# Patient Record
Sex: Male | Born: 1940 | Hispanic: No | Marital: Married | Smoking: Never smoker
Health system: Southern US, Community
[De-identification: ages and names within clinical notes are randomized; demographics above are authoritative.]

## PROBLEM LIST (undated history)

## (undated) DIAGNOSIS — I714 Abdominal aortic aneurysm, without rupture, unspecified: Secondary | ICD-10-CM

## (undated) DIAGNOSIS — I10 Essential (primary) hypertension: Secondary | ICD-10-CM

## (undated) DIAGNOSIS — N400 Enlarged prostate without lower urinary tract symptoms: Secondary | ICD-10-CM

## (undated) DIAGNOSIS — E785 Hyperlipidemia, unspecified: Secondary | ICD-10-CM

## (undated) DIAGNOSIS — R351 Nocturia: Secondary | ICD-10-CM

## (undated) HISTORY — PX: CARPAL TUNNEL RELEASE: SHX101

## (undated) HISTORY — PX: EYE SURGERY: SHX253

## (undated) HISTORY — DX: Abdominal aortic aneurysm, without rupture, unspecified: I71.40

---

## 2009-11-06 ENCOUNTER — Encounter: Admission: RE | Admit: 2009-11-06 | Discharge: 2009-11-06 | Payer: Self-pay | Admitting: *Deleted

## 2009-11-06 IMAGING — US US CAROTID DUPLEX BILAT
1 series · 13 of 24 positions shown · non-contrast
Comparison: None.

CLINICAL DATA: Transit ischemic attacks

BILATERAL CAROTID DUPLEX ULTRASOUND
TECHNIQUE: Gray scale imaging, color Doppler and duplex ultrasound
was performed of bilateral carotid and vertebral arteries in the
neck.

[Series 1: us carotid duplex bilat · 0.06mm/px · 13 of 61 slices shown]
[im 1/61]
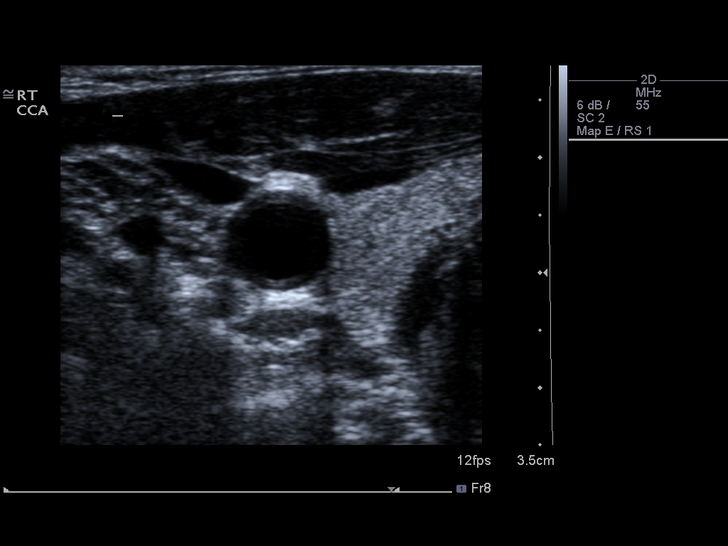
[im 6/61]
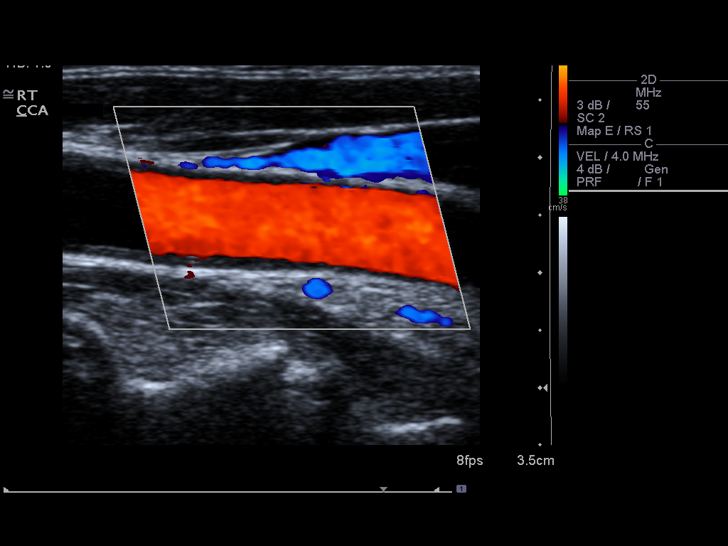
[im 11/61]
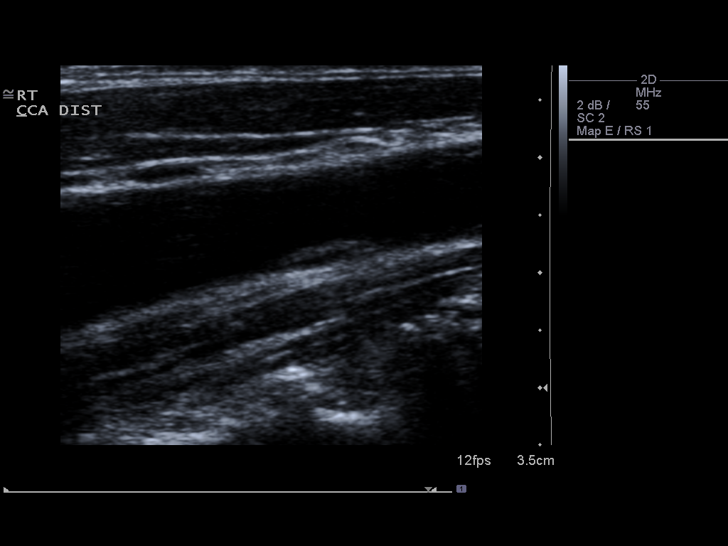
[im 16/61]
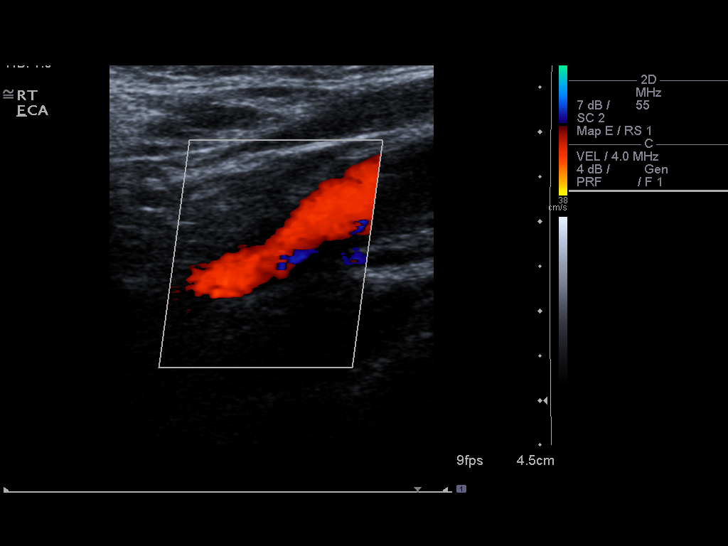
[im 21/61]
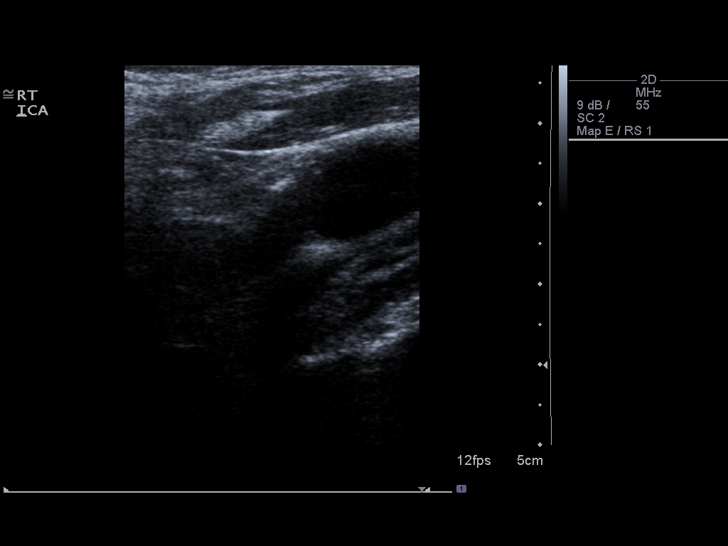
[im 27/61]
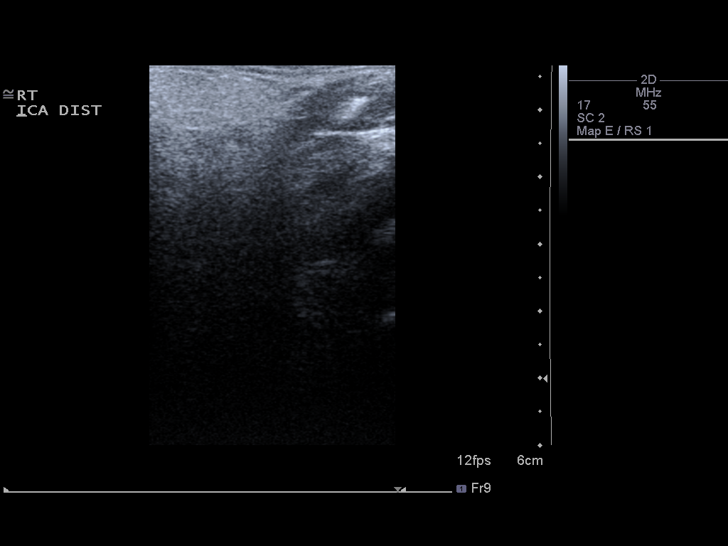
[im 32/61]
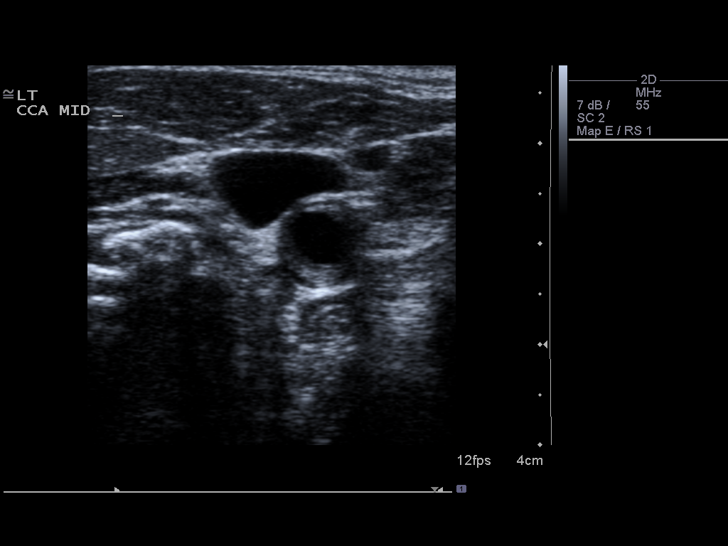
[im 34/61]
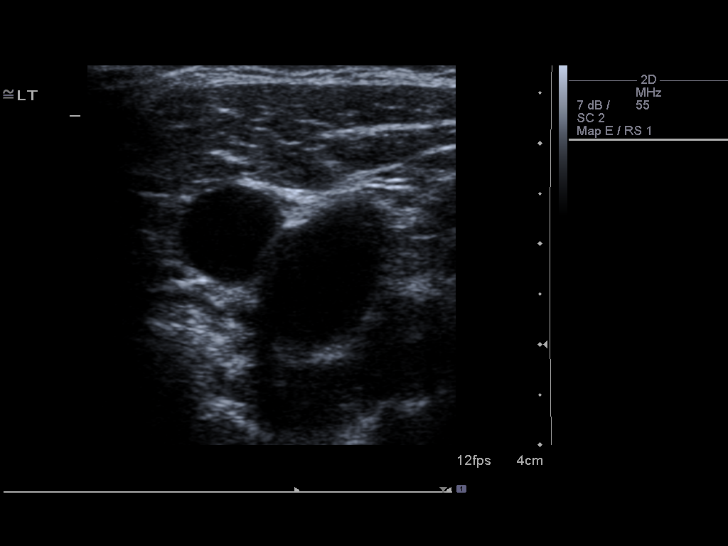
[im 40/61]
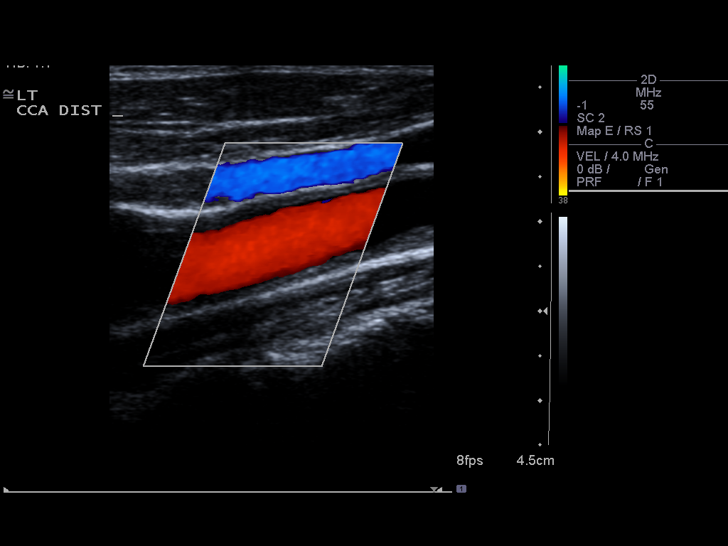
[im 45/61]
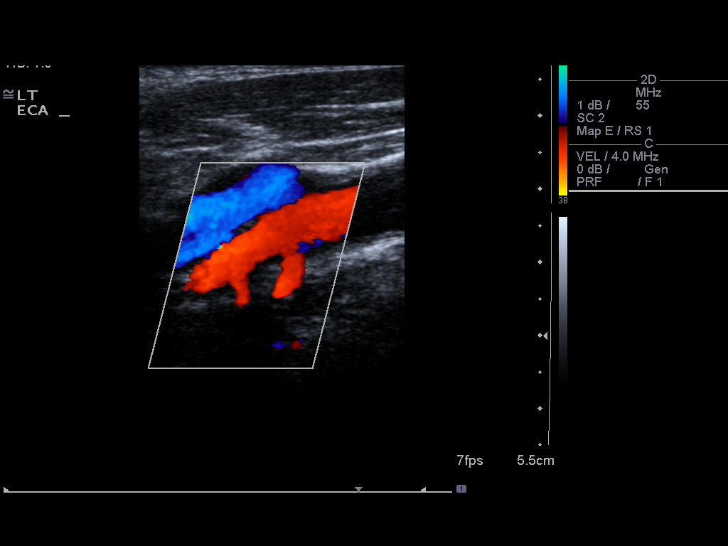
[im 50/61]
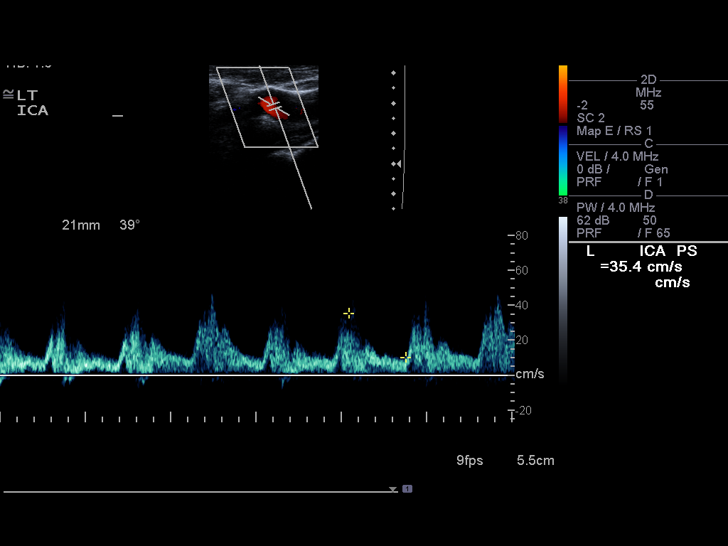
[im 55/61]
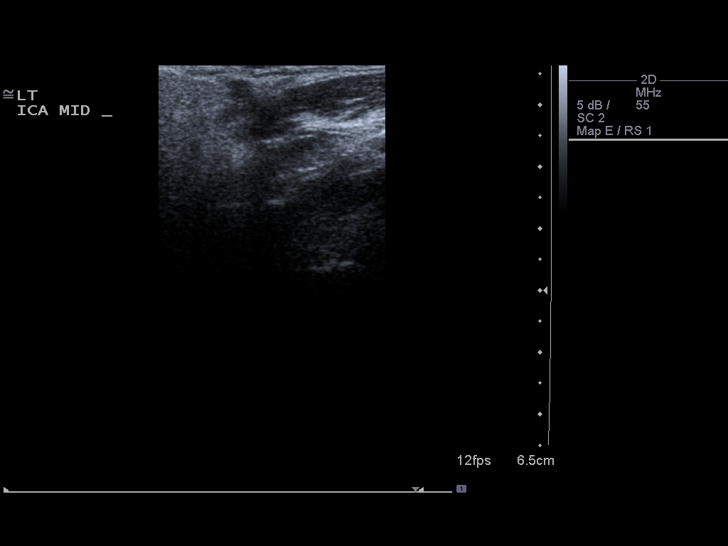
[im 61/61]
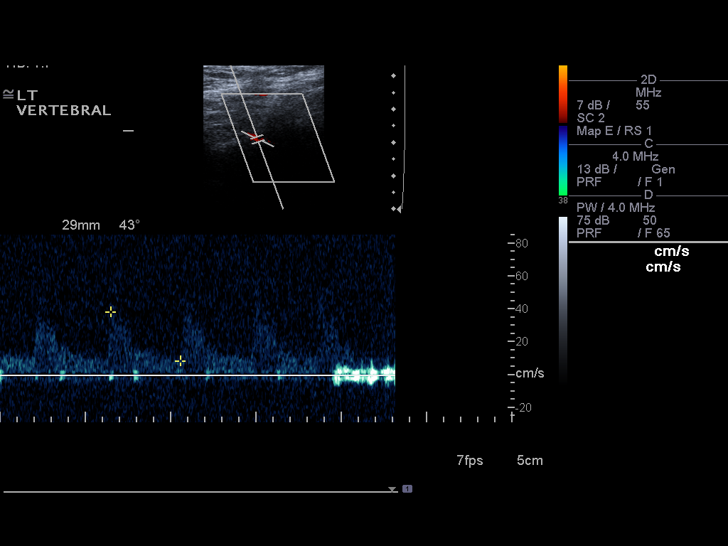

[13 of 24 positions shown; findings below may reference images not displayed]

Criteria:  Quantification of carotid stenosis is based on velocity
parameters that correlate the residual internal carotid diameter
with NASCET-based stenosis levels, using the diameter of the distal
internal carotid lumen as the denominator for stenosis measurement.

The following velocity measurements were obtained:

                 PEAK SYSTOLIC/END DIASTOLIC
RIGHT
ICA:                        74/21cm/sec
CCA:                        131/24cm/sec
SYSTOLIC ICA/CCA RATIO:
DIASTOLIC ICA/CCA RATIO:
ECA:                        71/11cm/sec

LEFT
ICA:                        78/28cm/sec
CCA:                        95/22cm/sec
SYSTOLIC ICA/CCA RATIO:
DIASTOLIC ICA/CCA RATIO:
ECA:                        60/9cm/sec
FINDINGS: RIGHT CAROTID ARTERY: Minimal right carotid bifurcation plaque
formation and atherosclerosis.  No hemodynamically significant ICA
stenosis, velocity elevation, or turbulent flow.

RIGHT VERTEBRAL ARTERY:  Antegrade

LEFT CAROTID ARTERY: Mild left carotid bifurcation plaque formation
and atherosclerosis, slightly more than the left.  Despite this,
there is no hemodynamically significant ICA stenosis, velocity
elevation, or turbulent flow.

LEFT VERTEBRAL ARTERY:  Antegrade
IMPRESSION: Left greater right carotid atherosclerosis.  No hemodynamically
significant ICA stenosis on either side.

## 2010-03-27 ENCOUNTER — Telehealth: Payer: Self-pay | Admitting: Family Medicine

## 2010-08-04 ENCOUNTER — Encounter: Admission: RE | Admit: 2010-08-04 | Discharge: 2010-08-04 | Payer: Self-pay | Admitting: Family Medicine

## 2010-10-07 NOTE — Progress Notes (Signed)
Summary: no show for new pt appt  Phone Note Other Incoming   Summary of Call: no show for new pt appt  Follow-up for Phone Call        charge the NS fee Follow-up by: Nelwyn Salisbury MD,  March 27, 2010 1:43 PM  Additional Follow-up for Phone Call Additional follow up Details #1::        Pt called and apologized about not coming in for ov today. Pt had a family emergency and couldnt keep appt.       Additional Follow-up by: Lucy Antigua,  March 27, 2010 4:41 PM

## 2011-12-31 ENCOUNTER — Other Ambulatory Visit: Payer: Self-pay | Admitting: Family Medicine

## 2011-12-31 DIAGNOSIS — R0989 Other specified symptoms and signs involving the circulatory and respiratory systems: Secondary | ICD-10-CM

## 2011-12-31 DIAGNOSIS — Z136 Encounter for screening for cardiovascular disorders: Secondary | ICD-10-CM

## 2012-01-05 ENCOUNTER — Ambulatory Visit
Admission: RE | Admit: 2012-01-05 | Discharge: 2012-01-05 | Disposition: A | Discharge status: Home / Self Care | Payer: Medicare Other | Source: Ambulatory Visit | Attending: Family Medicine | Admitting: Family Medicine

## 2012-01-05 DIAGNOSIS — R0989 Other specified symptoms and signs involving the circulatory and respiratory systems: Secondary | ICD-10-CM

## 2012-01-05 DIAGNOSIS — Z136 Encounter for screening for cardiovascular disorders: Secondary | ICD-10-CM

## 2012-01-05 IMAGING — US US AORTA SCREENING (MEDICARE)
1 series · 14 of 25 positions shown · non-contrast
Comparison: None

CLINICAL DATA: Screening for abdominal aortic aneurysm.

ABDOMINAL AORTA SCREENING ULTRASOUND
TECHNIQUE: Ultrasound examination of the abdominal aorta was
performed as a screening evaluation for abdominal aortic aneurysm.

[Series 1: us aorta screening (medicare) · 0.24mm/px · 14 of 30 slices shown]
[im 1/30]
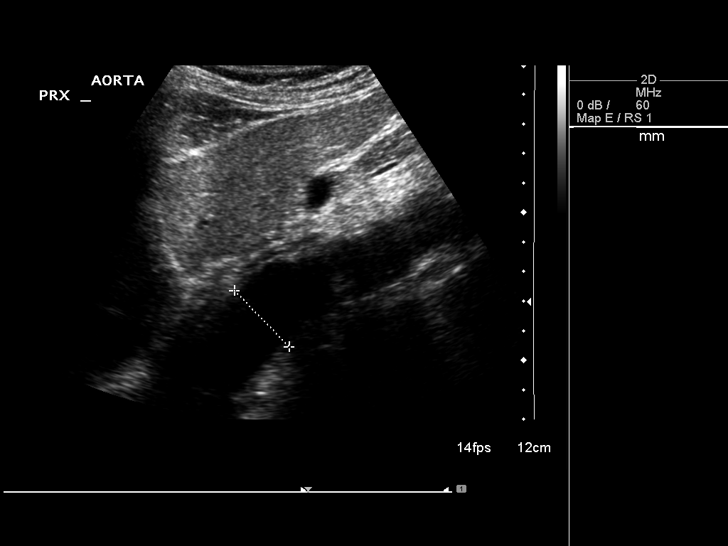
[im 3/30]
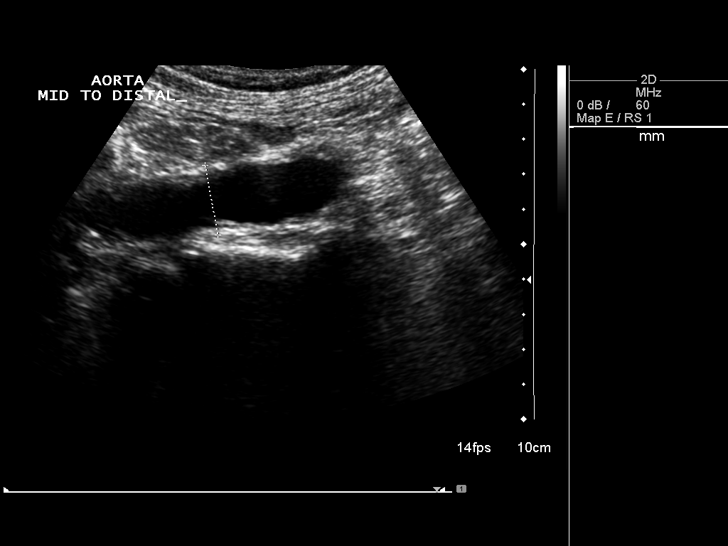
[im 5/30]
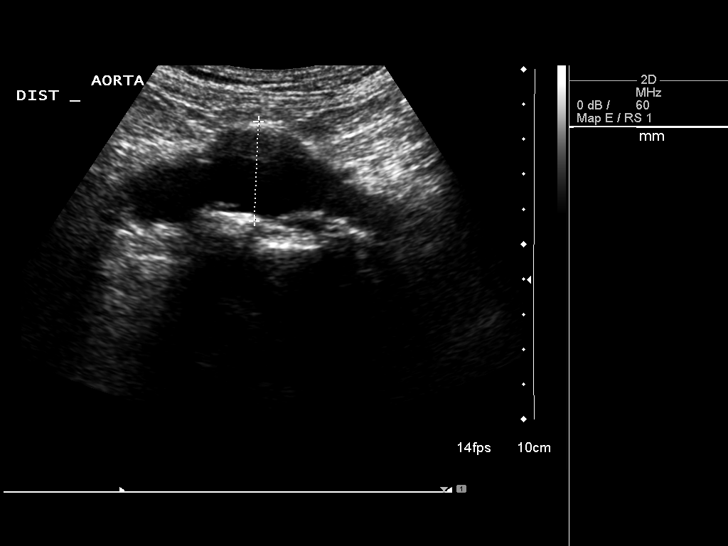
[im 8/30]
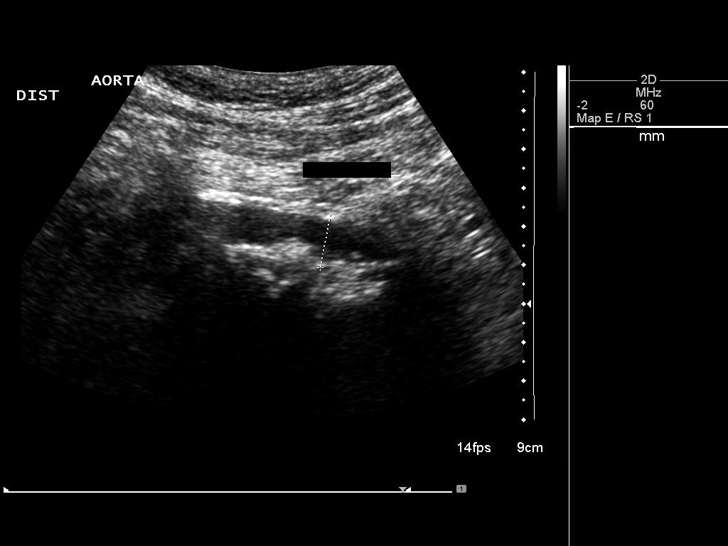
[im 10/30]
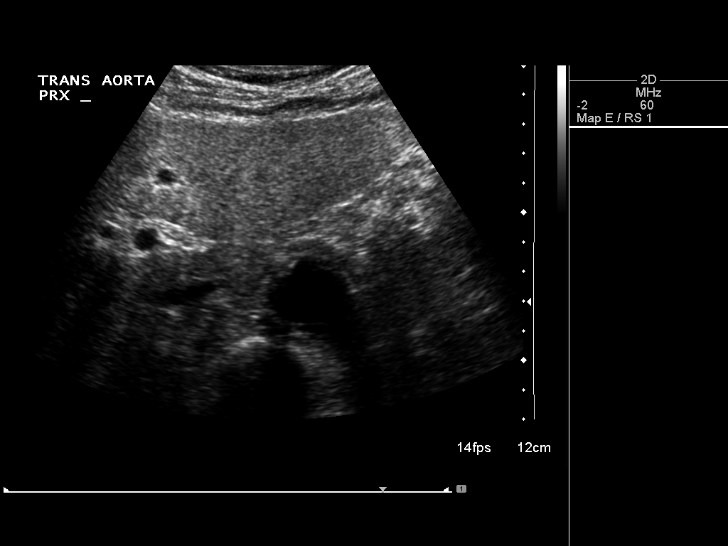
[im 11/30]
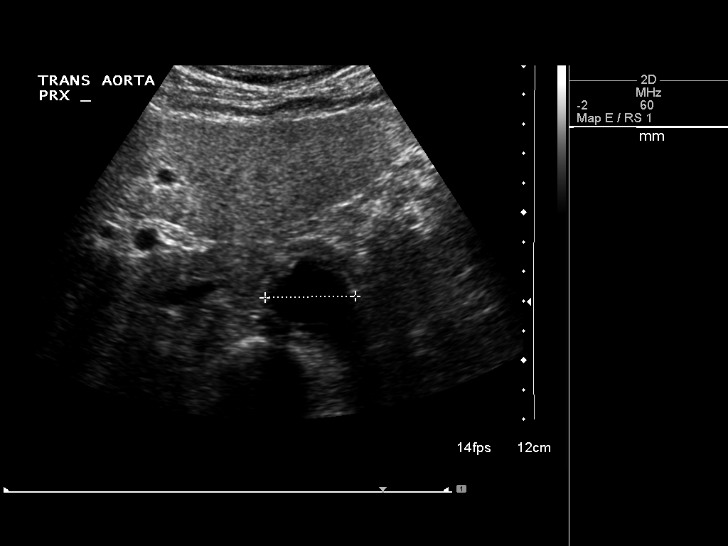
[im 14/30]
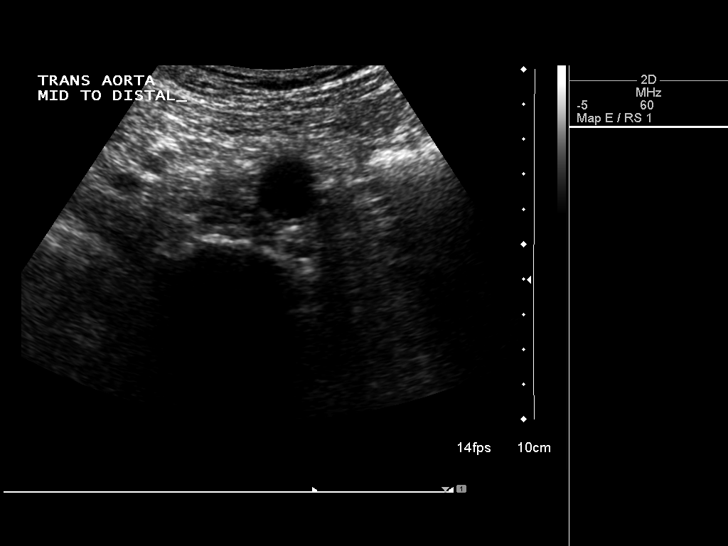
[im 16/30]
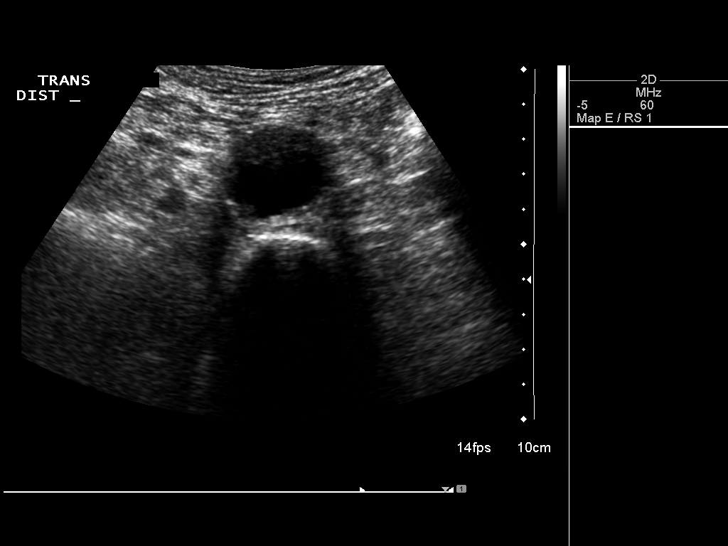
[im 19/30]
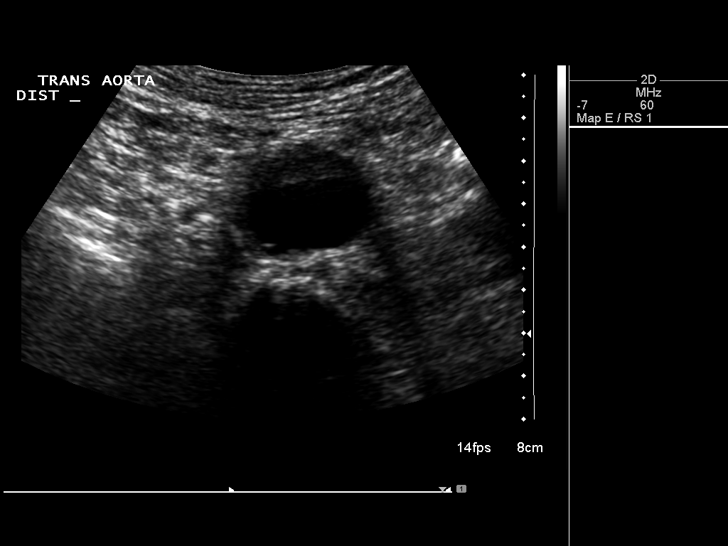
[im 20/30]
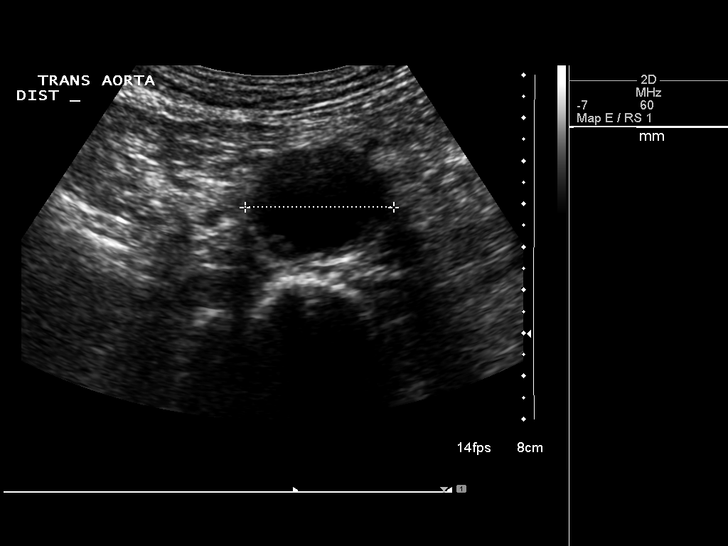
[im 22/30]
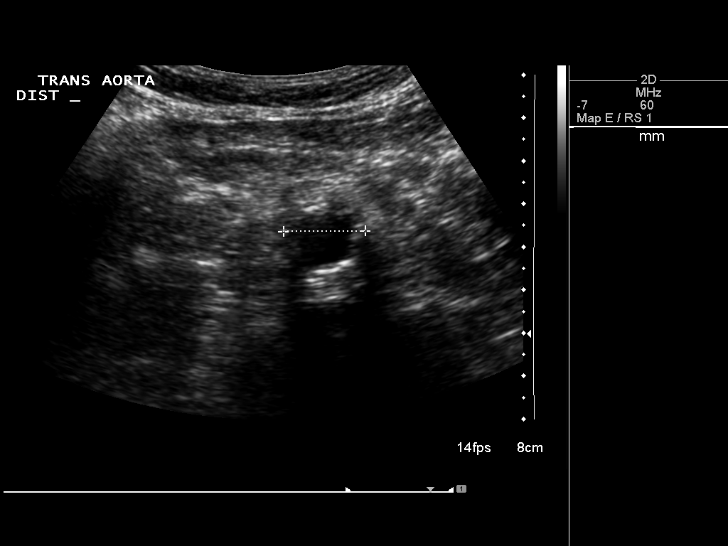
[im 25/30]
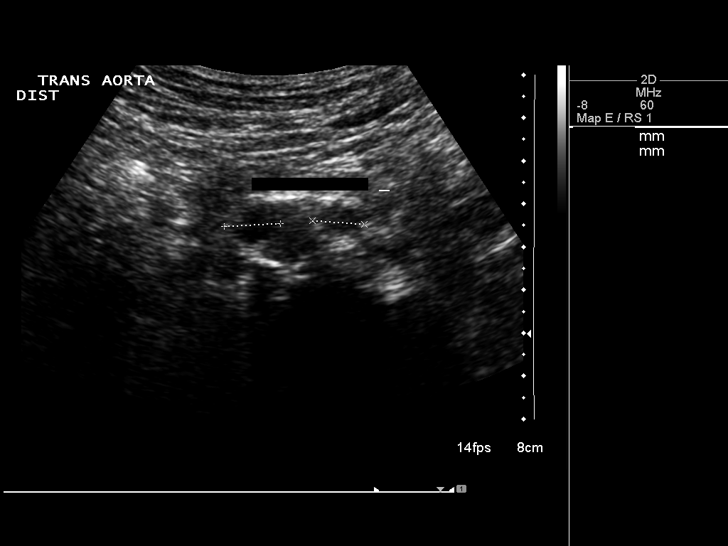
[im 27/30]
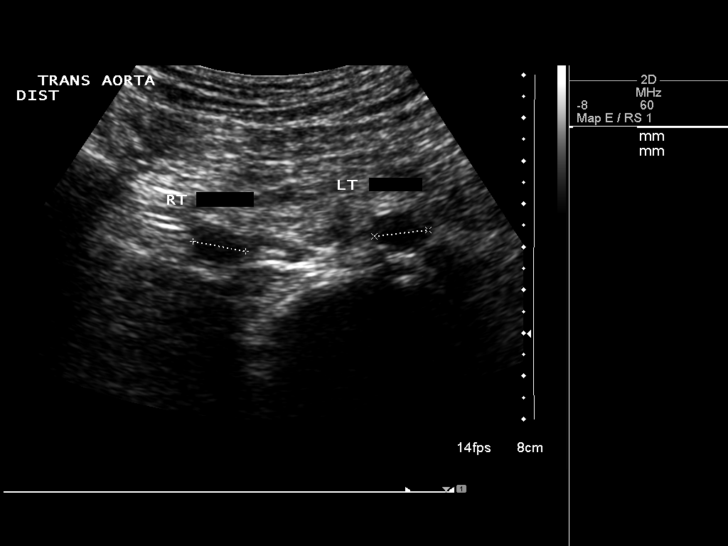
[im 30/30]
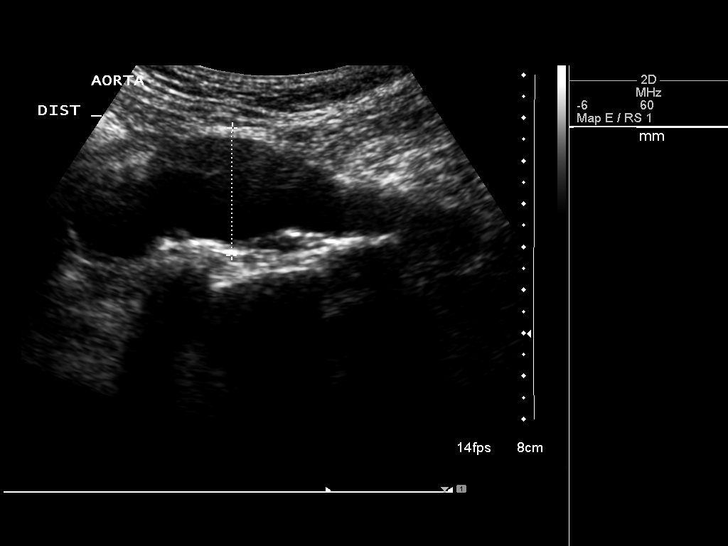

[14 of 25 positions shown; findings below may reference images not displayed]

Abdominal Aorta: There is a small focal bulge of the distal
abdominal aorta with maximum diameter of 3.5 x 3.0 cm consistent
with a small distal abdominal aortic aneurysm. Atheromatous change
is noted diffusely.

      Maximum AP diameter:  1.4 cm.
      Maximum TRV diameter:  1.4 cm.
IMPRESSION: Small distal abdominal aortic aneurysm of 3.5 x 3.0 cm.

## 2019-09-21 ENCOUNTER — Ambulatory Visit: Payer: Medicare Other | Attending: Internal Medicine

## 2019-09-21 DIAGNOSIS — Z23 Encounter for immunization: Secondary | ICD-10-CM | POA: Insufficient documentation

## 2019-09-21 NOTE — Progress Notes (Signed)
   Covid-19 Vaccination Clinic  Name:  Sohum Delillo    MRN: [id number] DOB: June 19, 1941  09/21/2019  Mr. Sondgeroth was observed post Covid-19 immunization for 15 minutes without incidence. He was provided with Vaccine Information Sheet and instruction to access the V-Safe system.   Mr. Mundorf was instructed to call 911 with any severe reactions post vaccine: [name] Kitchen Difficulty breathing  . Swelling of your face and throat  . A fast heartbeat  . A bad rash all over your body  . Dizziness and weakness    Immunizations Administered    Name Date Dose VIS Date Route   Pfizer COVID-19 Vaccine 09/21/2019 11:13 AM 0.3 mL 08/18/2019 Intramuscular   Manufacturer: [employer], [employer]   Lot: [id number]   NDC: [address]-1

## 2019-10-10 ENCOUNTER — Ambulatory Visit: Payer: Medicare Other | Attending: Internal Medicine

## 2019-10-10 DIAGNOSIS — Z23 Encounter for immunization: Secondary | ICD-10-CM | POA: Insufficient documentation

## 2019-10-10 NOTE — Progress Notes (Signed)
   Covid-19 Vaccination Clinic  Name:  Allen Arias    MRN: [id number] DOB: 1940/12/08  10/10/2019  [name] was observed post Covid-19 immunization for 15 minutes without incidence. He was provided with Vaccine Information Sheet and instruction to access the V-Safe system.   [name] was instructed to call 911 with any severe reactions post vaccine: [name] Kitchen Difficulty breathing  . Swelling of your face and throat  . A fast heartbeat  . A bad rash all over your body  . Dizziness and weakness    Immunizations Administered    Name Date Dose VIS Date Route   Pfizer COVID-19 Vaccine 10/10/2019 11:01 AM 0.3 mL 08/18/2019 Intramuscular   Manufacturer: [employer], [employer]   Lot: [id number]   NDC: [address]-4

## 2020-02-03 ENCOUNTER — Observation Stay (HOSPITAL_COMMUNITY)
Admission: EM | Admit: 2020-02-03 | Discharge: 2020-02-04 | Disposition: A | Discharge status: Home / Self Care | Payer: Medicare Other | Attending: Internal Medicine | Admitting: Internal Medicine

## 2020-02-03 ENCOUNTER — Other Ambulatory Visit: Payer: Self-pay

## 2020-02-03 ENCOUNTER — Observation Stay (HOSPITAL_COMMUNITY): Payer: Medicare Other

## 2020-02-03 ENCOUNTER — Encounter (HOSPITAL_COMMUNITY): Payer: Self-pay | Admitting: Emergency Medicine

## 2020-02-03 DIAGNOSIS — R17 Unspecified jaundice: Secondary | ICD-10-CM | POA: Insufficient documentation

## 2020-02-03 DIAGNOSIS — I10 Essential (primary) hypertension: Secondary | ICD-10-CM | POA: Diagnosis present

## 2020-02-03 DIAGNOSIS — R21 Rash and other nonspecific skin eruption: Secondary | ICD-10-CM | POA: Insufficient documentation

## 2020-02-03 DIAGNOSIS — E871 Hypo-osmolality and hyponatremia: Principal | ICD-10-CM | POA: Diagnosis present

## 2020-02-03 DIAGNOSIS — D692 Other nonthrombocytopenic purpura: Secondary | ICD-10-CM | POA: Diagnosis present

## 2020-02-03 DIAGNOSIS — D696 Thrombocytopenia, unspecified: Secondary | ICD-10-CM

## 2020-02-03 DIAGNOSIS — R2243 Localized swelling, mass and lump, lower limb, bilateral: Secondary | ICD-10-CM | POA: Diagnosis present

## 2020-02-03 DIAGNOSIS — Z79899 Other long term (current) drug therapy: Secondary | ICD-10-CM | POA: Diagnosis not present

## 2020-02-03 DIAGNOSIS — L819 Disorder of pigmentation, unspecified: Secondary | ICD-10-CM

## 2020-02-03 DIAGNOSIS — E860 Dehydration: Secondary | ICD-10-CM | POA: Diagnosis not present

## 2020-02-03 DIAGNOSIS — D693 Immune thrombocytopenic purpura: Secondary | ICD-10-CM | POA: Diagnosis not present

## 2020-02-03 DIAGNOSIS — M7989 Other specified soft tissue disorders: Secondary | ICD-10-CM

## 2020-02-03 DIAGNOSIS — Z20822 Contact with and (suspected) exposure to covid-19: Secondary | ICD-10-CM | POA: Insufficient documentation

## 2020-02-03 HISTORY — DX: Essential (primary) hypertension: I10

## 2020-02-03 LAB — CBC WITH DIFFERENTIAL/PLATELET
Abs Immature Granulocytes: 0.02 10*3/uL (ref 0.00–0.07)
Basophils Absolute: 0 10*3/uL (ref 0.0–0.1)
Basophils Relative: 0 %
Eosinophils Absolute: 0 10*3/uL (ref 0.0–0.5)
Eosinophils Relative: 0 %
HCT: 39.4 % (ref 39.0–52.0)
Hemoglobin: 13.6 g/dL (ref 13.0–17.0)
Immature Granulocytes: 1 %
Lymphocytes Relative: 6 %
Lymphs Abs: 0.2 10*3/uL — ABNORMAL LOW (ref 0.7–4.0)
MCH: 33.4 pg (ref 26.0–34.0)
MCHC: 34.5 g/dL (ref 30.0–36.0)
MCV: 96.8 fL (ref 80.0–100.0)
Monocytes Absolute: 0.1 10*3/uL (ref 0.1–1.0)
Monocytes Relative: 3 %
Neutro Abs: 3.9 10*3/uL (ref 1.7–7.7)
Neutrophils Relative %: 90 %
Platelets: 135 10*3/uL — ABNORMAL LOW (ref 150–400)
RBC: 4.07 MIL/uL — ABNORMAL LOW (ref 4.22–5.81)
RDW: 12.4 % (ref 11.5–15.5)
WBC: 4.3 10*3/uL (ref 4.0–10.5)
nRBC: 0 % (ref 0.0–0.2)

## 2020-02-03 LAB — BILIRUBIN, FRACTIONATED(TOT/DIR/INDIR)
Bilirubin, Direct: 0.4 mg/dL — ABNORMAL HIGH (ref 0.0–0.2)
Indirect Bilirubin: 3.8 mg/dL — ABNORMAL HIGH (ref 0.3–0.9)
Total Bilirubin: 4.2 mg/dL — ABNORMAL HIGH (ref 0.3–1.2)

## 2020-02-03 LAB — COMPREHENSIVE METABOLIC PANEL
ALT: 22 U/L (ref 0–44)
AST: 25 U/L (ref 15–41)
Albumin: 4.5 g/dL (ref 3.5–5.0)
Alkaline Phosphatase: 43 U/L (ref 38–126)
Anion gap: 10 (ref 5–15)
BUN: 16 mg/dL (ref 8–23)
CO2: 22 mmol/L (ref 22–32)
Calcium: 8.8 mg/dL — ABNORMAL LOW (ref 8.9–10.3)
Chloride: 93 mmol/L — ABNORMAL LOW (ref 98–111)
Creatinine, Ser: 0.8 mg/dL (ref 0.61–1.24)
GFR calc Af Amer: >60 mL/min (ref >60)
GFR calc non Af Amer: >60 mL/min (ref >60)
Glucose, Bld: 142 mg/dL — ABNORMAL HIGH (ref 70–99)
Potassium: 4.1 mmol/L (ref 3.5–5.1)
Sodium: 125 mmol/L — ABNORMAL LOW (ref 135–145)
Total Bilirubin: 4.4 mg/dL — ABNORMAL HIGH (ref 0.3–1.2)
Total Protein: 7.2 g/dL (ref 6.5–8.1)

## 2020-02-03 LAB — SEDIMENTATION RATE: Sed Rate: 0 mm/hr (ref 0–16)

## 2020-02-03 LAB — OSMOLALITY: Osmolality: 272 mOsm/kg — ABNORMAL LOW (ref 275–295)

## 2020-02-03 LAB — SARS CORONAVIRUS 2 BY RT PCR (HOSPITAL ORDER, PERFORMED IN ~~LOC~~ HOSPITAL LAB): SARS Coronavirus 2: NEGATIVE

## 2020-02-03 LAB — PROTIME-INR
INR: 1.3 — ABNORMAL HIGH (ref 0.8–1.2)
Prothrombin Time: 15.2 seconds (ref 11.4–15.2)

## 2020-02-03 LAB — APTT: aPTT: 28 seconds (ref 24–36)

## 2020-02-03 LAB — LACTIC ACID, PLASMA
Lactic Acid, Venous: 2.7 mmol/L (ref 0.5–1.9)
Lactic Acid, Venous: 1.8 mmol/L (ref 0.5–1.9)

## 2020-02-03 LAB — C-REACTIVE PROTEIN: CRP: <0.5 mg/dL (ref <1.0)

## 2020-02-03 LAB — CK: Total CK: 135 U/L (ref 49–397)

## 2020-02-03 IMAGING — MR MR [PERSON_NAME] LOW W/O CM*R*
6 series · 40 of 40 positions shown · non-contrast
Comparison: None.

CLINICAL DATA: Soft tissue mass of the right lower leg. Bruising
and swelling and tenderness.

EXAM:
MRI OF LOWER RIGHT EXTREMITY WITHOUT CONTRAST
TECHNIQUE: Multiplanar, multisequence MR imaging of the right lower leg was
performed. No intravenous contrast was administered.

[Series 3: T1 · coronal · left · 4.0mm · 1.95mm/px · 3 of 26 slices shown (1 of 2)]
[im 1/26]
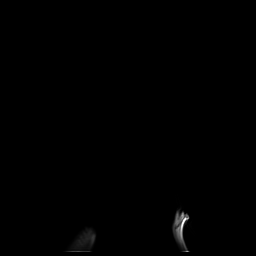
[im 13/26]
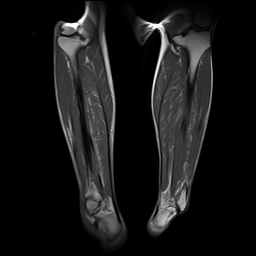
[im 26/26]
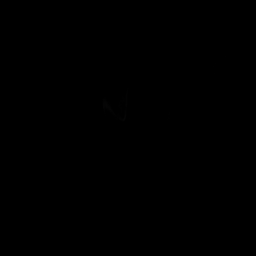

[Series 4: STIR · coronal · left · 4.0mm · 1.95mm/px · 3 of 26 slices shown (1 of 2)]
[im 1/26]
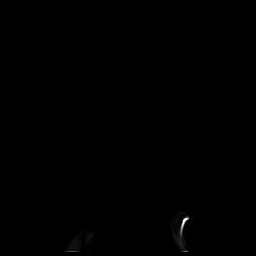
[im 13/26]
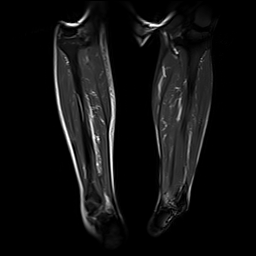
[im 26/26]
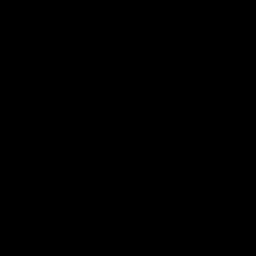

[Series 5: T1 · axial · left · 4.0mm · 0.78mm/px · z∈[-215,+143]mm · 10 of 73 slices shown (2 of 2)]
[im 1/73]
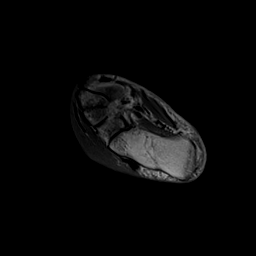
[im 9/73]
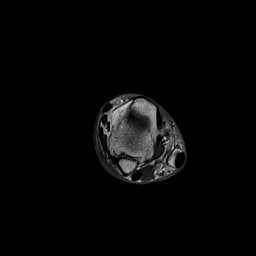
[im 17/73]
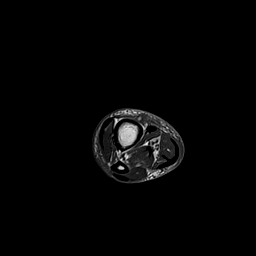
[im 25/73]
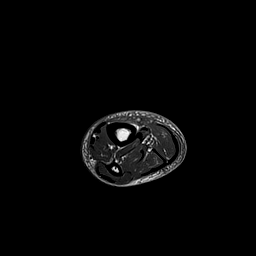
[im 33/73]
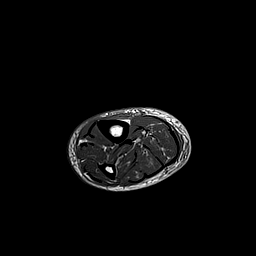
[im 41/73]
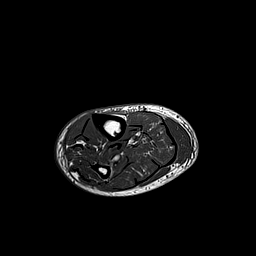
[im 49/73]
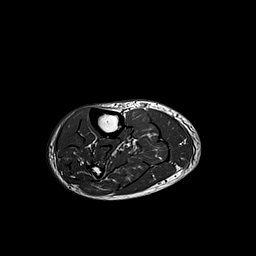
[im 57/73]
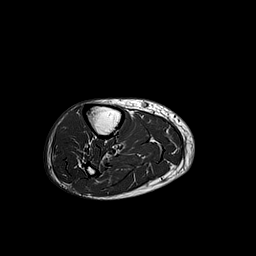
[im 65/73]
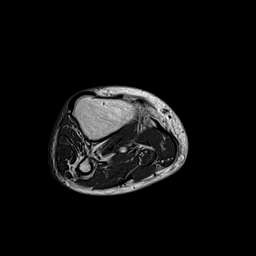
[im 73/73]
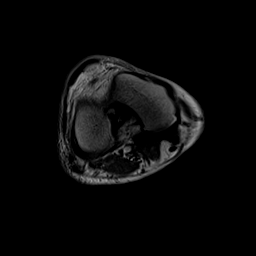

[Series 6: T2 fat-sat · axial · left · 4.0mm · 1.02mm/px · z∈[-219,+139]mm · 10 of 73 slices shown (1 of 2)]
[im 1/73]
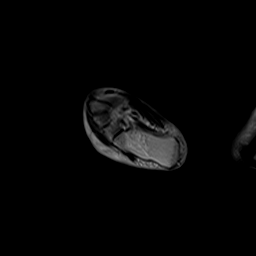
[im 9/73]
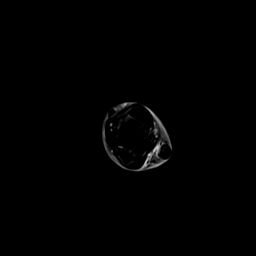
[im 17/73]
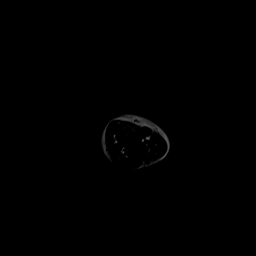
[im 25/73]
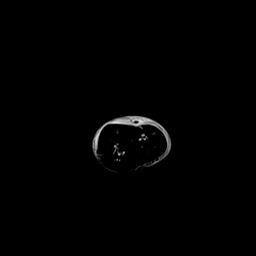
[im 33/73]
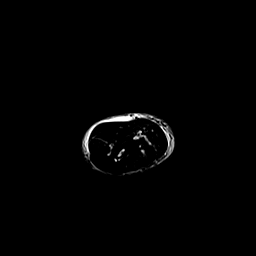
[im 41/73]
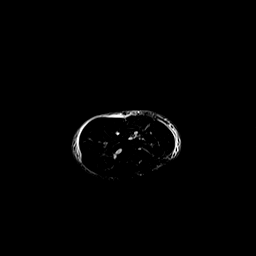
[im 49/73]
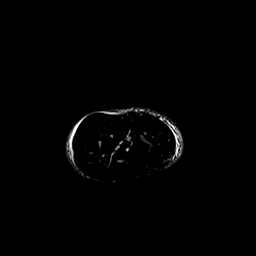
[im 57/73]
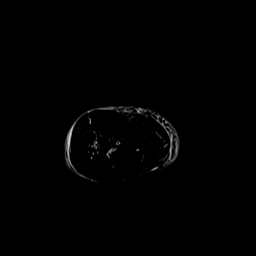
[im 65/73]
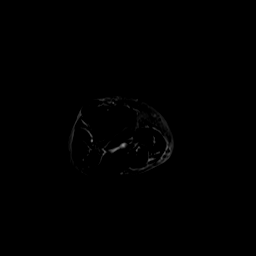
[im 73/73]
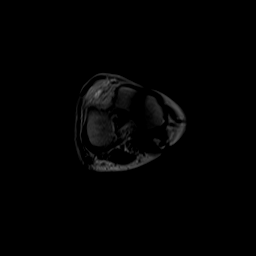

[Series 7: STIR · sagittal · left · 4.0mm · 1.64mm/px · 4 of 30 slices shown (2 of 2)]
[im 1/30]
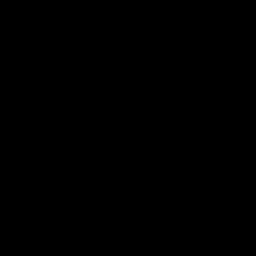
[im 10/30]
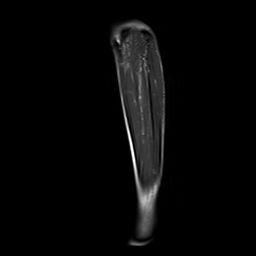
[im 20/30]
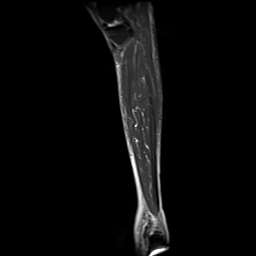
[im 30/30]
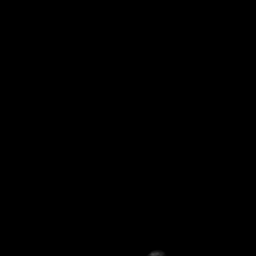

[Series 8: T2 fat-sat · axial · left · 4.0mm · 0.78mm/px · z∈[-213,+145]mm · 10 of 73 slices shown (2 of 2)]
[im 1/73]
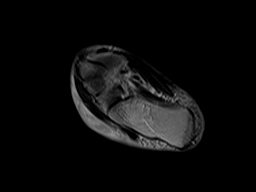
[im 9/73]
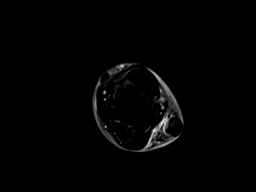
[im 17/73]
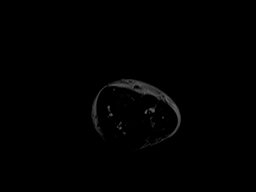
[im 25/73]
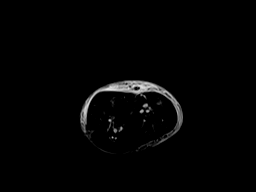
[im 33/73]
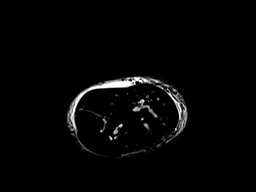
[im 41/73]
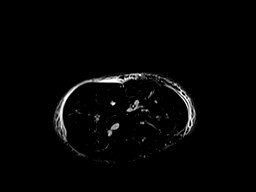
[im 49/73]
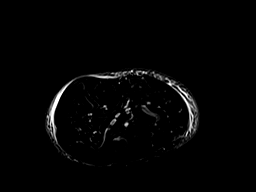
[im 57/73]
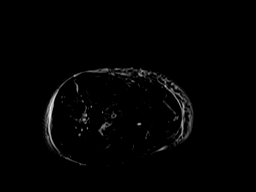
[im 65/73]
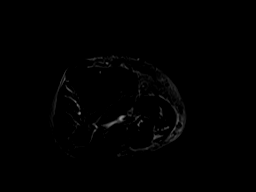
[im 73/73]
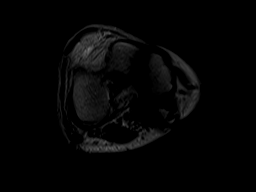

[40 of 40 positions shown; findings below may reference images not displayed]

FINDINGS: Bones/Joint/Cartilage

The tibia and fibula are normal. There are no knee or ankle
effusions.

Ligaments

The ligaments at the right knee are normal. No appreciable
abnormality of the ligaments of the right ankle.

Muscles and Tendons

The muscles and tendons of the right lower leg are normal.

Soft tissues

There is subcutaneous edema circumferentially in the right lower leg
more prominent distally and more prominent anteriorly just above the
ankle. This is nonspecific. There is no definable soft tissue mass.
IMPRESSION: 1. Nonspecific subcutaneous edema circumferentially in the right
lower leg more prominent distally and more prominent anteriorly just
above the ankle. No soft tissue mass.
2. Otherwise, normal exam.

## 2020-02-03 MED ORDER — SODIUM CHLORIDE 0.9 % IV BOLUS
1000.0000 mL | Freq: Once | INTRAVENOUS | Status: AC
  Administered 2020-02-03: 1000 mL via INTRAVENOUS

## 2020-02-03 MED ORDER — SODIUM CHLORIDE 0.9 % IV SOLN: INTRAVENOUS | Status: AC

## 2020-02-03 MED ORDER — ENALAPRIL MALEATE 10 MG PO TABS
20.0000 mg | ORAL_TABLET | Freq: Every day | ORAL | Status: DC
  Administered 2020-02-04: 20 mg via ORAL
  Filled 2020-02-03: qty 2

## 2020-02-03 MED ORDER — TRAZODONE HCL 50 MG PO TABS
50.0000 mg | ORAL_TABLET | Freq: Every evening | ORAL | Status: DC | PRN
  Administered 2020-02-03: 50 mg via ORAL
  Filled 2020-02-03: qty 1

## 2020-02-03 MED ORDER — ACETAMINOPHEN 325 MG PO TABS: 650.0000 mg | ORAL_TABLET | Freq: Four times a day (QID) | ORAL | Status: DC | PRN

## 2020-02-03 MED ORDER — SIMVASTATIN 20 MG PO TABS
20.0000 mg | ORAL_TABLET | Freq: Every day | ORAL | Status: DC
  Administered 2020-02-03: 20 mg via ORAL
  Filled 2020-02-03: qty 1

## 2020-02-03 MED ORDER — ONDANSETRON HCL 4 MG PO TABS: 4.0000 mg | ORAL_TABLET | Freq: Four times a day (QID) | ORAL | Status: DC | PRN

## 2020-02-03 MED ORDER — ONDANSETRON HCL 4 MG/2ML IJ SOLN: 4.0000 mg | Freq: Four times a day (QID) | INTRAMUSCULAR | Status: DC | PRN

## 2020-02-03 MED ORDER — OXYCODONE HCL 5 MG PO TABS
2.5000 mg | ORAL_TABLET | Freq: Four times a day (QID) | ORAL | Status: DC | PRN
  Administered 2020-02-04: 2.5 mg via ORAL
  Filled 2020-02-03: qty 1

## 2020-02-03 NOTE — Progress Notes (Signed)
Received report from Carrie RN.

## 2020-02-03 NOTE — ED Provider Notes (Signed)
[facility]-EMERGENCY DEPT Provider Note   CSN: [id number] Arrival date & time: 02/03/20  1443     History Chief Complaint  Patient presents with  . Leg Swelling    Mikkel Charrette is a 79 y.o. male with a history of hypertension presenting to the ED with rash and pain in his right lower extremity for the past 9 days.  The patient is primarily [place] speaking but her daughter is at bedside and helps to translate.  She tells me that approximately 10 days ago began having gradual onset of bruising in his right lower extremity.  This occurs from the knee down to his toes.  She says it tends to be painless when he is sitting in the bed, but sometimes can throb and ache, particularly at night, and also with walking.  She says this is never happened to him before.  She adamantly denies that he has had any falls or trauma.  They went to see his primary care doctor who ordered an ultrasound yesterday, per review of care everywhere records, there is no occlusion found.  They also went to an emergent Ortho care clinic with had x-rays of lower extremity which were negative.  She otherwise reports patient has had no other systemic symptoms.  She denies any fevers, chills, headaches, respiratory symptoms.  HPI     Past Medical History:  Diagnosis Date  . Hypertension     Patient Active Problem List   Diagnosis Date Noted  . Hyponatremia 02/03/2020  . Hyperbilirubinemia 02/03/2020  . Essential hypertension 02/03/2020  . Purpura (HCC) 02/03/2020  . Dehydration 02/03/2020    Past Surgical History:  Procedure Laterality Date  . CARPAL TUNNEL RELEASE         Family History  Problem Relation Age of Onset  . Gallbladder disease Mother        cholangiocarcinoma  . Gastric cancer Father     Social History   Tobacco Use  . Smoking status: Never Smoker  . Smokeless tobacco: Never Used  Substance Use Topics  . Alcohol use: Not on file  . Drug use: Not on file    Home  Medications Prior to Admission medications   Medication Sig Start Date End Date Taking? Authorizing Provider  Aspirin Buf,CaCarb-MgCarb-MgO, 81 MG TABS Take 81 mg by mouth as needed for pain.   Yes [provider]  dutasteride (AVODART) 0.5 MG capsule Take 0.5 mg by mouth daily. 01/15/20  Yes [provider]  enalapril (VASOTEC) 10 MG tablet Take 20 mg by mouth daily. 07/18/19  Yes [provider]  ketoconazole (NIZORAL) 2 % shampoo Apply 1 application topically as needed for rash. 01/15/20  Yes [provider]  simvastatin (ZOCOR) 20 MG tablet Take 20 mg by mouth at bedtime. 07/18/19  Yes [provider]  tamsulosin (FLOMAX) 0.4 MG CAPS capsule Take 0.8 mg by mouth daily. 07/18/19  Yes [provider]    Allergies    Patient has no known allergies.  Review of Systems   Review of Systems  Constitutional: Negative for chills and fever.  Eyes: Negative for photophobia and visual disturbance.  Respiratory: Negative for cough and shortness of breath.   Cardiovascular: Negative for chest pain and palpitations.  Gastrointestinal: Negative for abdominal pain and vomiting.  Genitourinary: Negative for dysuria and hematuria.  Musculoskeletal: Positive for arthralgias and myalgias.  Skin: Positive for color change and rash.  Allergic/Immunologic: Negative for food allergies and immunocompromised state.  Neurological: Negative for syncope  and light-headedness.  All other systems reviewed and are negative.   Physical Exam Updated Vital Signs BP (!) 149/88 (BP Location: Right Arm)   Pulse 92   Temp (!) 97.5 F (36.4 C) (Oral)   Resp 18   Ht 5\' 2"  (1.575 m)   Wt 56.2 kg   SpO2 96%   BMI 22.68 kg/m   Physical Exam Vitals and nursing note reviewed.  Constitutional:      Appearance: He is well-developed.  HENT:     Head: Normocephalic and atraumatic.  Eyes:     Conjunctiva/sclera: Conjunctivae normal.  Cardiovascular:     Rate and  Rhythm: Normal rate and regular rhythm.     Heart sounds: No murmur.  Pulmonary:     Effort: Pulmonary effort is normal. No respiratory distress.     Breath sounds: Normal breath sounds.  Abdominal:     Palpations: Abdomen is soft.     Tenderness: There is no abdominal tenderness.  Musculoskeletal:     Cervical back: Neck supple.  Skin:    General: Skin is warm and dry.     Comments: Nonblanching discolored rash of the right lower extremity from knee to toes, involving all toes, non-dependent rash Bruising and yellowing of right lower leg No significant tenderness of the toes or lower leg, no tenseness of the compartment  Neurological:     Mental Status: He is alert and oriented to person, place, and time.     Motor: No weakness.          ED Results / Procedures / Treatments   Labs (all labs ordered are listed, but only abnormal results are displayed) Labs Reviewed  COMPREHENSIVE METABOLIC PANEL - Abnormal; Notable for the following components:      Result Value   Sodium 125 (*)    Chloride 93 (*)    Glucose, Bld 142 (*)    Calcium 8.8 (*)    Total Bilirubin 4.4 (*)    All other components within normal limits  CBC WITH DIFFERENTIAL/PLATELET - Abnormal; Notable for the following components:   RBC 4.07 (*)    Platelets 135 (*)    Lymphs Abs 0.2 (*)    All other components within normal limits  LACTIC ACID, PLASMA - Abnormal; Notable for the following components:   Lactic Acid, Venous 2.7 (*)    All other components within normal limits  PROTIME-INR - Abnormal; Notable for the following components:   INR 1.3 (*)    All other components within normal limits  BILIRUBIN, FRACTIONATED(TOT/DIR/INDIR) - Abnormal; Notable for the following components:   Total Bilirubin 4.2 (*)    Bilirubin, Direct 0.4 (*)    Indirect Bilirubin 3.8 (*)    All other components within normal limits  SARS CORONAVIRUS 2 BY RT PCR (HOSPITAL ORDER, [facility])  LACTIC  ACID, PLASMA  CK  C-REACTIVE PROTEIN  SEDIMENTATION RATE  APTT  SODIUM, URINE, RANDOM  OSMOLALITY, URINE  OSMOLALITY  CBC  PROTIME-INR  APTT  BASIC METABOLIC PANEL  HEPATIC FUNCTION PANEL    EKG None  Radiology MR TIBIA FIBULA RIGHT WO CONTRAST  Result Date: 02/03/2020 CLINICAL DATA:  Soft tissue mass of the right lower leg. Bruising and swelling and tenderness. EXAM: MRI OF LOWER RIGHT EXTREMITY WITHOUT CONTRAST TECHNIQUE: Multiplanar, multisequence MR imaging of the right lower leg was performed. No intravenous contrast was administered. COMPARISON:  None. FINDINGS: Bones/Joint/Cartilage The tibia and fibula are normal. There are no knee or ankle  effusions. Ligaments The ligaments at the right knee are normal. No appreciable abnormality of the ligaments of the right ankle. Muscles and Tendons The muscles and tendons of the right lower leg are normal. Soft tissues There is subcutaneous edema circumferentially in the right lower leg more prominent distally and more prominent anteriorly just above the ankle. This is nonspecific. There is no definable soft tissue mass. IMPRESSION: 1. Nonspecific subcutaneous edema circumferentially in the right lower leg more prominent distally and more prominent anteriorly just above the ankle. No soft tissue mass. 2. Otherwise, normal exam. Electronically Signed   By: Francene Boyers M.D.   On: 02/03/2020 14:19    Procedures Procedures (including critical care time)  Medications Ordered in ED Medications  enalapril (VASOTEC) tablet 20 mg (has no administration in time range)  simvastatin (ZOCOR) tablet 20 mg (has no administration in time range)  0.9 %  sodium chloride infusion ( Intravenous New Bag/Given 02/03/20 1553)  ondansetron (ZOFRAN) tablet 4 mg (has no administration in time range)    Or  ondansetron (ZOFRAN) injection 4 mg (has no administration in time range)  oxyCODONE (Oxy IR/ROXICODONE) immediate release tablet 2.5 mg (has no  administration in time range)  acetaminophen (TYLENOL) tablet 650 mg (has no administration in time range)  sodium chloride 0.9 % bolus 1,000 mL (0 mLs Intravenous Stopped 02/03/20 1036)    ED Course  I have reviewed the triage vital signs and the nursing notes.  Pertinent labs & imaging results that were available during my care of the patient were reviewed by me and considered in my medical decision making (see chart for details).  79 yo male here with RLL ecchymosis, discoloration for 10 days.  No hx of trauma.  He had negative xrays at an ortho urgent care yesterday.  He also had a negative DVT ultrasound performed by his PCP (records visible in careeverywhere) in the past 2 days.    His compartments are soft on exam.  Doubtful of compartment syndrome.  No warmth or fever or pain to suggest cellulitis or nec fasc.  COVID negative - less likely covid toes/rash.  This may be a vasculitis, although it's quite unusual that it is unilateral.    His labs are notable for lactate 2.4 and mild thrombocytopenia at 135.  His kidney function is normal.  Plan to give IVF, trend lactate Would consider medical admission for further testing (ddx may include a paraneoplastic syndrome?), monitoring of platelets, lactate.   Clinical Course as of Feb 03 1708  Sat Feb 03, 2020  4496 Lactic Acid, Venous(!!): 2.7 [MT]  1023 With an elevated lactate and mild thrombocytopenia, will admit for further medical diagnostic [place]   [MT]  1100 Signed out to Dr Maryfrances Bunnell   [MT]    Clinical Course User Index [MT] Renaye Rakers, Kermit Balo, MD    Final Clinical Impression(s) / ED Diagnoses Final diagnoses:  Rash  Discoloration of skin of lower leg  Thrombocytopenia [facility])    Rx / DC Orders ED Discharge Orders    None       Terald Sleeper, MD 02/03/20 1709

## 2020-02-03 NOTE — ED Notes (Signed)
Date and time results received: 02/03/20 9:32 AM  (use smartphrase ".now" to insert current time)  Test: lactic acid Critical Value: 2.7  Name of Provider Notified: Trifan   Orders Received? Or Actions Taken?:

## 2020-02-03 NOTE — ED Notes (Signed)
hospitalist at bedside when went to take patient up to floor.

## 2020-02-03 NOTE — Progress Notes (Signed)
ED TO INPATIENT HANDOFF REPORT  Name/Age/Gender Allen Arias 79 y.o. male  Code Status   Home/SNF/Other Home  Chief Complaint Hyponatremia [E87.1]  Level of Care/Admitting Diagnosis ED Disposition    ED Disposition Condition [facility] Area: [place] [100102]  Level of Care: Med-Surg [16]  Covid Evaluation: Asymptomatic Screening Protocol (No Symptoms)  Diagnosis: Hyponatremia [546270]  Admitting Physician: Edwin Dada [[id number]]  Attending Physician: Edwin Dada [[id number]]       Medical History Past Medical History:  Diagnosis Date  . Hypertension     Allergies No Known Allergies  IV Location/Drains/Wounds Patient Lines/Drains/Airways Status   Active Line/Drains/Airways    Name:   Placement date:   Placement time:   Site:   Days:   Peripheral IV 02/03/20 Left Antecubital   02/03/20    0835    Antecubital   less than 1          Labs/Imaging Results for orders placed or performed during the hospital encounter of 02/03/20 (from the past 48 hour(s))  SARS Coronavirus 2 by RT PCR (hospital order, performed in [facility] hospital lab) Nasopharyngeal Nasopharyngeal Swab     Status: None   Collection Time: 02/03/20  8:04 AM   Specimen: Nasopharyngeal Swab  Result Value Ref Range   SARS Coronavirus 2 NEGATIVE NEGATIVE    Comment: (NOTE) SARS-CoV-2 target nucleic acids are NOT DETECTED. The SARS-CoV-2 RNA is generally detectable in upper and lower respiratory specimens during the acute phase of infection. The lowest concentration of SARS-CoV-2 viral copies this assay can detect is 250 copies / mL. A negative result does not preclude SARS-CoV-2 infection and should not be used as the sole basis for treatment or other patient management decisions.  A negative result may occur with improper specimen collection / handling, submission of specimen other than nasopharyngeal swab, presence of viral mutation(s)  within the areas targeted by this assay, and inadequate number of viral copies (<250 copies / mL). A negative result must be combined with clinical observations, patient history, and epidemiological information. Fact Sheet for Patients:   StrictlyIdeas.no Fact Sheet for Healthcare Providers: BankingDealers.co.za This test is not yet approved or cleared  by the [place] FDA and has been authorized for detection and/or diagnosis of SARS-CoV-2 by FDA under an Emergency Use Authorization (EUA).  This EUA will remain in effect (meaning this test can be used) for the duration of the COVID-19 declaration under Section 564(b)(1) of the Act, 21 U.S.C. section 360bbb-3(b)(1), unless the authorization is terminated or revoked sooner. Performed at [facility], [place] [address] [place], [place] [address]   Comprehensive metabolic panel     Status: Abnormal   Collection Time: 02/03/20  8:35 AM  Result Value Ref Range   Sodium 125 (L) 135 - 145 mmol/L   Potassium 4.1 3.5 - 5.1 mmol/L   Chloride 93 (L) 98 - 111 mmol/L   CO2 22 22 - 32 mmol/L   Glucose, Bld 142 (H) 70 - 99 mg/dL    Comment: Glucose reference range applies only to samples taken after fasting for at least 8 hours.   BUN 16 8 - 23 mg/dL   Creatinine, Ser 0.80 0.61 - 1.24 mg/dL   Calcium 8.8 (L) 8.9 - 10.3 mg/dL   Total Protein 7.2 6.5 - 8.1 g/dL   Albumin 4.5 3.5 - 5.0 g/dL   AST 25 15 - 41 U/L   ALT 22 0 - 44 U/L  Alkaline Phosphatase 43 38 - 126 U/L   Total Bilirubin 4.4 (H) 0.3 - 1.2 mg/dL   GFR calc non Af Amer >60 >60 mL/min   GFR calc Af Amer >60 >60 mL/min   Anion gap 10 5 - 15    Comment: Performed at [facility], [address], [place], [place] [address]  CBC with Differential     Status: Abnormal   Collection Time: 02/03/20  8:35 AM  Result Value Ref Range   WBC 4.3 4.0 - 10.5 K/uL   RBC 4.07 (L) 4.22 - 5.81 MIL/uL    Hemoglobin 13.6 13.0 - 17.0 g/dL   HCT 66.5 99.3 - 57.0 %   MCV 96.8 80.0 - 100.0 fL   MCH 33.4 26.0 - 34.0 pg   MCHC 34.5 30.0 - 36.0 g/dL   RDW 17.7 93.9 - 03.0 %   Platelets 135 (L) 150 - 400 K/uL   nRBC 0.0 0.0 - 0.2 %   Neutrophils Relative % 90 %   Neutro Abs 3.9 1.7 - 7.7 K/uL   Lymphocytes Relative 6 %   Lymphs Abs 0.2 (L) 0.7 - 4.0 K/uL   Monocytes Relative 3 %   Monocytes Absolute 0.1 0.1 - 1.0 K/uL   Eosinophils Relative 0 %   Eosinophils Absolute 0.0 0.0 - 0.5 K/uL   Basophils Relative 0 %   Basophils Absolute 0.0 0.0 - 0.1 K/uL   Immature Granulocytes 1 %   Abs Immature Granulocytes 0.02 0.00 - 0.07 K/uL    Comment: Performed at [facility], [address], [place], [place] [address]  Lactic acid, plasma     Status: Abnormal   Collection Time: 02/03/20  8:35 AM  Result Value Ref Range   Lactic Acid, Venous 2.7 (HH) 0.5 - 1.9 mmol/L    Comment: CRITICAL RESULT CALLED TO, READ BACK BY AND VERIFIED WITH: Eliezer Lofts RN 02/03/20 @0932  LEE, L Performed at [facility], [address] [place], [place]   CK     Status: None   Collection Time: 02/03/20  8:35 AM  Result Value Ref Range   Total CK 135 49 - 397 U/L    Comment: Performed at [facility], [address], [place], [place]  C-reactive protein     Status: None   Collection Time: 02/03/20  8:35 AM  Result Value Ref Range   CRP <0.5 <1.0 mg/dL    Comment: Performed at [facility], [address] [place], [place]  Sedimentation rate     Status: None   Collection Time: 02/03/20  8:35 AM  Result Value Ref Range   Sed Rate 0 0 - 16 mm/hr    Comment: Performed at [facility], [address] [place], [place]  Lactic acid, plasma     Status: None   Collection Time: 02/03/20 10:01 AM  Result Value Ref Range   Lactic Acid, Venous 1.8 0.5 - 1.9 mmol/L    Comment: Performed at  [facility], [address], [place], [place]  APTT     Status: None   Collection Time: 02/03/20 11:01 AM  Result Value Ref Range   aPTT 28 24 - 36 seconds    Comment: Performed at [facility], [address] [place], [place]  Protime-INR     Status: Abnormal   Collection Time: 02/03/20 11:01 AM  Result Value Ref  Range   Prothrombin Time 15.2 11.4 - 15.2 seconds   INR 1.3 (H) 0.8 - 1.2    Comment: (NOTE) INR goal varies based on device and disease states. Performed at [facility], [address] [place], [place] [address]    No results found.  Pending Labs Unresulted Labs (From admission, onward)   None      Vitals/Pain Today's Vitals   02/03/20 1000 02/03/20 1100 02/03/20 1113 02/03/20 1222  BP: (!) 157/82 (!) 140/107 (!) 140/107 (!) 169/86  Pulse: 71 78 89 82  Resp:   18 18  Temp:      TempSrc:      SpO2: 98% 98% 96% 96%  Weight:      Height:      PainSc:        Isolation Precautions No active isolations  Medications Medications  sodium chloride 0.9 % bolus 1,000 mL (0 mLs Intravenous Stopped 02/03/20 1036)    Mobility walks

## 2020-02-03 NOTE — ED Triage Notes (Signed)
Pt's family interprets stating that for 10 days patient had right leg bruising and swelling. Had xray and doppler done which was negative for fractures or blood clots. Denies any injuries to cause swelling in leg.

## 2020-02-03 NOTE — H&P (Signed)
History and Physical  Patient Name: Allen Arias     KZL:[id number]    DOB: 07-04-1941    DOA: 02/03/2020 PCP: Bernerd Limbo, MD  Patient coming from: Home  Chief Complaint: Leg pain      HPI: Allen Arias is a 79 y.o. M with hx BPH, HTN who presents with 1 week leg bruise and pain.  All history collected through video phonic interpreter.  Patient was in his normal state of health until about 10 days ago, when he had relatively abrupt onset of pain and swelling in the right calf.  He also developed bruising around this time both in the calf, and in the foot.  He went to his PCP, who ordered an ultrasound to rule out DVT.  He went to an orthopedist who did an x-ray that ruled out fracture.  After that he came to the ER.  He has had no joint pains, no constitutional symptoms, fever, fatigue.  No other rashes on other parts of his body.  No trauma to that part of his body.  In the ER he was noted to be hyponatremic to 125, and had a lactate of 2.7.  The hospital service were asked to evaluate        ROS: Review of Systems  Constitutional: Negative for chills, fever and malaise/fatigue.  Eyes: Negative for blurred vision and double vision.  Respiratory: Negative for cough, hemoptysis, sputum production, shortness of breath and wheezing.   Cardiovascular: Negative for chest pain, palpitations, orthopnea, claudication, leg swelling and PND.  Gastrointestinal: Negative for abdominal pain, blood in stool, constipation, diarrhea, heartburn, melena, nausea and vomiting.  Genitourinary: Negative for flank pain, frequency, hematuria and urgency.  Musculoskeletal: Negative for joint pain.  Skin: Negative for rash.  Neurological: Negative for dizziness, tingling, tremors, speech change, seizures, loss of consciousness, weakness and headaches.  Endo/Heme/Allergies: Bruises/bleeds easily.  All other systems reviewed and are negative.         Past Medical History:  Diagnosis Date  .  Hypertension     Past Surgical History:  Procedure Laterality Date  . CARPAL TUNNEL RELEASE      Social History: Patient lives with his family.  The patient walks unassisted.  Nonsmoker.  From [place].  No Known Allergies  Family history: family history includes Gallbladder disease in his mother; Gastric cancer in his father.  Prior to Admission medications   Medication Sig Start Date End Date Taking? Authorizing Provider  Aspirin Buf,CaCarb-MgCarb-MgO, 81 MG TABS Take 81 mg by mouth as needed for pain.   Yes [provider]  dutasteride (AVODART) 0.5 MG capsule Take 0.5 mg by mouth daily. 01/15/20  Yes [provider]  enalapril (VASOTEC) 10 MG tablet Take 20 mg by mouth daily. 07/18/19  Yes [provider]  ketoconazole (NIZORAL) 2 % shampoo Apply 1 application topically as needed for rash. 01/15/20  Yes [provider]  simvastatin (ZOCOR) 20 MG tablet Take 20 mg by mouth at bedtime. 07/18/19  Yes [provider]  tamsulosin (FLOMAX) 0.4 MG CAPS capsule Take 0.8 mg by mouth daily. 07/18/19  Yes [provider]       Physical Exam: BP (!) 145/89 (BP Location: Right Arm)   Pulse 96   Temp 98.1 F (36.7 C) (Oral)   Resp 18   Ht 5\' 2"  (1.575 m)   Wt 56.2 kg   SpO2 98%   BMI 22.68 kg/m  General appearance: Well-developed, adult male, alert and in no acute distress.  Eyes: Anicteric, conjunctiva pink, lids and lashes normal. PERRL.    ENT: No nasal deformity, discharge, epistaxis.  Hearing normal. OP moist without lesions.  Edntures in place. Neck: No neck masses.  Trachea midline.  No thyromegaly/tenderness. Lymph: No cervical or supraclavicular lymphadenopathy. Skin: Warm and dry.  No jaundice.  No suspicious rashes or lesions. Cardiac: RRR, nl S1-S2, no murmurs appreciated.  Capillary refill is brisk.  JVP not visible.  No LE edema.  Radial pulses 2+ and symmetric. Respiratory: Normal respiratory rate and rhythm.  CTAB  without rales or wheezes. Abdomen: Abdomen soft.  No TTP. No ascites, distension, hepatosplenomegaly.   MSK: No deformities or effusions of the large joints of the upper or lower extremities bilaterally.  No cyanosis or clubbing. Neuro: Cranial nerves normal.  Sensation intact to light touch. Speech is fluent.  Muscle strength normal.    Psych: Sensorium intact and responding to questions, attention normal.  Behavior appropriate.  Affect normal.  Judgment and insight appear normal.       Labs on Admission:  I have personally reviewed following labs and imaging studies: CBC: Recent Labs  Lab 02/03/20 0835  WBC 4.3  NEUTROABS 3.9  HGB 13.6  HCT 39.4  MCV 96.8  PLT 135*   Basic Metabolic Panel: Recent Labs  Lab 02/03/20 0835  NA 125*  K 4.1  CL 93*  CO2 22  GLUCOSE 142*  BUN 16  CREATININE 0.80  CALCIUM 8.8*   GFR: Estimated Creatinine Clearance: 60.7 mL/min (by C-G formula based on SCr of 0.8 mg/dL).  Liver Function Tests: Recent Labs  Lab 02/03/20 0835 02/03/20 1306  AST 25  --   ALT 22  --   ALKPHOS 43  --   BILITOT 4.4* 4.2*  PROT 7.2  --   ALBUMIN 4.5  --    No results for input(s): LIPASE, AMYLASE in the last 168 hours. No results for input(s): AMMONIA in the last 168 hours. Coagulation Profile: Recent Labs  Lab 02/03/20 1101  INR 1.3*   Cardiac Enzymes: Recent Labs  Lab 02/03/20 0835  CKTOTAL 135     Recent Results (from the past 240 hour(s))  SARS Coronavirus 2 by RT PCR (hospital order, performed in [facility] hospital lab) Nasopharyngeal Nasopharyngeal Swab     Status: None   Collection Time: 02/03/20  8:04 AM   Specimen: Nasopharyngeal Swab  Result Value Ref Range Status   SARS Coronavirus 2 NEGATIVE NEGATIVE Final    Comment: (NOTE) SARS-CoV-2 target nucleic acids are NOT DETECTED. The SARS-CoV-2 RNA is generally detectable in upper and lower respiratory specimens during the acute phase of infection. The lowest concentration of  SARS-CoV-2 viral copies this assay can detect is 250 copies / mL. A negative result does not preclude SARS-CoV-2 infection and should not be used as the sole basis for treatment or other patient management decisions.  A negative result may occur with improper specimen collection / handling, submission of specimen other than nasopharyngeal swab, presence of viral mutation(s) within the areas targeted by this assay, and inadequate number of viral copies (<250 copies / mL). A negative result must be combined with clinical observations, patient history, and epidemiological information. Fact Sheet for Patients:   BoilerBrush.com.cy Fact Sheet for Healthcare Providers: https://pope.com/ This test is not yet approved or cleared  by the [place] FDA and has been authorized for detection and/or diagnosis of SARS-CoV-2 by FDA under an Emergency Use Authorization (EUA).  This EUA will remain in effect (meaning  this test can be used) for the duration of the COVID-19 declaration under Section 564(b)(1) of the Act, 21 U.S.C. section 360bbb-3(b)(1), unless the authorization is terminated or revoked sooner. Performed at [facility], [address] [place], [place] [address]            Radiological Exams on Admission: Personally reviewed MRI report shows no mass or muscle tear: MR TIBIA FIBULA RIGHT WO CONTRAST  Result Date: 02/03/2020 CLINICAL DATA:  Soft tissue mass of the right lower leg. Bruising and swelling and tenderness. EXAM: MRI OF LOWER RIGHT EXTREMITY WITHOUT CONTRAST TECHNIQUE: Multiplanar, multisequence MR imaging of the right lower leg was performed. No intravenous contrast was administered. COMPARISON:  None. FINDINGS: Bones/Joint/Cartilage The tibia and fibula are normal. There are no knee or ankle effusions. Ligaments The ligaments at the right knee are normal. No appreciable abnormality of the ligaments of the  right ankle. Muscles and Tendons The muscles and tendons of the right lower leg are normal. Soft tissues There is subcutaneous edema circumferentially in the right lower leg more prominent distally and more prominent anteriorly just above the ankle. This is nonspecific. There is no definable soft tissue mass. IMPRESSION: 1. Nonspecific subcutaneous edema circumferentially in the right lower leg more prominent distally and more prominent anteriorly just above the ankle. No soft tissue mass. 2. Otherwise, normal exam. Electronically Signed   By: Francene Boyers M.D.   On: 02/03/2020 14:19           Assessment/Plan     Hyponatremia My guess is that this is from dehydration given elevated lactic acidosis although on exam he appears euvolemic -Check urine osmolality, serum osmolality -Check urine sodium -Gentle fluids overnight and check sodium again in the morning  Elevated lactic acid I suspect this is from dehydration, I have no suspicion for infection.  Certainly not cellulitis.  Purpura, pain There is been no trauma.  Certainly a muscle tear could cause bruising and swelling in this manner, although I am unclear.  Other things that can cause painful purpura would be IgA vasculitis or other vasculitis.    The constellation of purpura and anemia is intriguing. This would likely need to be worked up as an outpatient.   Mild thrombocytopenia, mild anemia and elevated INR Unclear relation  Hyperbilirubinemia -Check fractionated bilirubin if this is indirect I will obtain LDH and haptoglobin  Hypertension -Continue enalapril  BPH -Hold Flomax and finasteride for now     Code Status: FULL  Family Communication: Daughter  Disposition Plan: Anticipate IV fluids, repeat Na in AM.  If sodium normal, likely home with PCP follow up for oncology/hematology and Rheum referrals. Consults called: None Admission status: OBS   At the point of initial evaluation, it is my clinical  opinion that admission for OBSERVATION is reasonable and necessary because the patient's presenting complaints in the context of their chronic conditions represent sufficient risk of deterioration or significant morbidity to constitute reasonable grounds for close observation in the hospital setting, but that the patient may be medically stable for discharge from the hospital within 24 to 48 hours.    Medical decision making: Patient seen at 4:56 PM on 02/03/2020.  The patient was discussed with Dr. Renaye Rakers.  What exists of the patient's chart was reviewed in depth and summarized above.  Clinical condition: stable.        Earl Lites [facility] Please page though AMION or Epic secure chat:  For password, contact charge nurse

## 2020-02-04 DIAGNOSIS — M79604 Pain in right leg: Secondary | ICD-10-CM | POA: Diagnosis not present

## 2020-02-04 DIAGNOSIS — E871 Hypo-osmolality and hyponatremia: Secondary | ICD-10-CM | POA: Diagnosis not present

## 2020-02-04 LAB — CBC
HCT: 37.4 % — ABNORMAL LOW (ref 39.0–52.0)
Hemoglobin: 12.8 g/dL — ABNORMAL LOW (ref 13.0–17.0)
MCH: 33.5 pg (ref 26.0–34.0)
MCHC: 34.2 g/dL (ref 30.0–36.0)
MCV: 97.9 fL (ref 80.0–100.0)
Platelets: 145 10*3/uL — ABNORMAL LOW (ref 150–400)
RBC: 3.82 MIL/uL — ABNORMAL LOW (ref 4.22–5.81)
RDW: 12.6 % (ref 11.5–15.5)
WBC: 9.2 10*3/uL (ref 4.0–10.5)
nRBC: 0 % (ref 0.0–0.2)

## 2020-02-04 LAB — HEPATIC FUNCTION PANEL
ALT: 19 U/L (ref 0–44)
AST: 23 U/L (ref 15–41)
Albumin: 4.2 g/dL (ref 3.5–5.0)
Alkaline Phosphatase: 35 U/L — ABNORMAL LOW (ref 38–126)
Bilirubin, Direct: 0.4 mg/dL — ABNORMAL HIGH (ref 0.0–0.2)
Indirect Bilirubin: 3.6 mg/dL — ABNORMAL HIGH (ref 0.3–0.9)
Total Bilirubin: 4 mg/dL — ABNORMAL HIGH (ref 0.3–1.2)
Total Protein: 6.6 g/dL (ref 6.5–8.1)

## 2020-02-04 LAB — BASIC METABOLIC PANEL
Anion gap: 9 (ref 5–15)
BUN: 21 mg/dL (ref 8–23)
CO2: 21 mmol/L — ABNORMAL LOW (ref 22–32)
Calcium: 8.8 mg/dL — ABNORMAL LOW (ref 8.9–10.3)
Chloride: 104 mmol/L (ref 98–111)
Creatinine, Ser: 0.72 mg/dL (ref 0.61–1.24)
GFR calc Af Amer: >60 mL/min (ref >60)
GFR calc non Af Amer: >60 mL/min (ref >60)
Glucose, Bld: 124 mg/dL — ABNORMAL HIGH (ref 70–99)
Potassium: 4.2 mmol/L (ref 3.5–5.1)
Sodium: 134 mmol/L — ABNORMAL LOW (ref 135–145)

## 2020-02-04 LAB — DIFFERENTIAL
Abs Immature Granulocytes: 0.08 10*3/uL — ABNORMAL HIGH (ref 0.00–0.07)
Basophils Absolute: 0 10*3/uL (ref 0.0–0.1)
Basophils Relative: 0 %
Eosinophils Absolute: 0 10*3/uL (ref 0.0–0.5)
Eosinophils Relative: 0 %
Immature Granulocytes: 1 %
Lymphocytes Relative: 4 %
Lymphs Abs: 0.4 10*3/uL — ABNORMAL LOW (ref 0.7–4.0)
Monocytes Absolute: 0.4 10*3/uL (ref 0.1–1.0)
Monocytes Relative: 4 %
Neutro Abs: 8.3 10*3/uL — ABNORMAL HIGH (ref 1.7–7.7)
Neutrophils Relative %: 91 %

## 2020-02-04 LAB — PROTIME-INR
INR: 1.3 — ABNORMAL HIGH (ref 0.8–1.2)
Prothrombin Time: 15.9 seconds — ABNORMAL HIGH (ref 11.4–15.2)

## 2020-02-04 LAB — APTT: aPTT: 30 seconds (ref 24–36)

## 2020-02-04 MED ORDER — OXYCODONE HCL 5 MG PO TABS: 2.5000 mg | ORAL_TABLET | Freq: Four times a day (QID) | ORAL | 0 refills | Status: AC | PRN

## 2020-02-04 NOTE — Evaluation (Signed)
Physical Therapy Evaluation Patient Details Name: Allen Arias MRN: [id number] DOB: 1941-04-06 Today's Date: 02/04/2020   History of Present Illness  79yo male with hx BPH, HTN who presents with 1 week leg bruise and pain, denies trauma. prior doppler and xrays RLE negative per MD notes. MRI negative  Clinical Impression  Pt admitted with above diagnosis.  PT is mobilizing very well. C/o pain RLE increasing with gait/dependent positioning. No f/u needs at this time.  Provided pt with HEP for home to assist with LE movement and edema control. Will review and monitor progress with HEP while in acute setting. Educated on intermittent RLE elevation at home.   Pt currently with functional limitations due to the deficits listed below (see PT Problem List). Pt will benefit from skilled PT to increase their independence and safety with mobility to allow discharge to the venue listed below.       Follow Up Recommendations No PT follow up    Equipment Recommendations  None recommended by PT    Recommendations for Other Services       Precautions / Restrictions Restrictions Weight Bearing Restrictions: No      Mobility  Bed Mobility Overal bed mobility: Needs Assistance Bed Mobility: Supine to Sit;Sit to Supine     Supine to sit: Independent Sit to supine: Independent      Transfers Overall transfer level: Independent                  Ambulation/Gait Ambulation/Gait assistance: Supervision Gait Distance (Feet): 200 Feet Assistive device: None Gait Pattern/deviations: Step-through pattern;Decreased stance time - right;Antalgic     General Gait Details: supervision for safety  Stairs            Wheelchair Mobility    Modified Rankin (Stroke Patients Only)       Balance                                             Pertinent Vitals/Pain Pain Assessment: Faces Pain Score: 4  Faces Pain Scale: Hurts a little bit Pain Location: right  LE Pain Descriptors / Indicators: Sore Pain Intervention(s): Monitored during session;Limited activity within patient's tolerance    Home Living Family/patient expects to be discharged to:: Private residence   Available Help at Discharge: Family           Home Equipment: None      Prior Function Level of Independence: Independent               Hand Dominance        Extremity/Trunk Assessment   Upper Extremity Assessment Upper Extremity Assessment: Overall WFL for tasks assessed    Lower Extremity Assessment Lower Extremity Assessment: RLE deficits/detail RLE Deficits / Details: AROM WFL, strength grossly WFL.  distal LE edematous       Communication   Communication: Prefers language other than English  Cognition Arousal/Alertness: Awake/alert Behavior During Therapy: WFL for tasks assessed/performed Overall Cognitive Status: Within Functional Limits for tasks assessed                                        General Comments      Exercises General Exercises - Lower Extremity Ankle Circles/Pumps: AROM;10 reps;Right Other Exercises Other Exercises: reviewed ex program--ankle inversion/eversion, LAQs, heel  slides, toe spreading   Assessment/Plan    PT Assessment Patient needs continued PT services  PT Problem List Pain       PT Treatment Interventions Therapeutic exercise;Gait training;Functional mobility training;Patient/family education    PT Goals (Current goals can be found in the Care Plan section)  Acute Rehab PT Goals Patient Stated Goal: get better PT Goal Formulation: With patient Time For Goal Achievement: 02/11/20 Potential to Achieve Goals: Good    Frequency Min 1X/week   Barriers to discharge        Co-evaluation               AM-PAC PT "6 Clicks" Mobility  Outcome Measure Help needed turning from your back to your side while in a flat bed without using bedrails?: None Help needed moving from lying on  your back to sitting on the side of a flat bed without using bedrails?: None Help needed moving to and from a bed to a chair (including a wheelchair)?: None Help needed standing up from a chair using your arms (e.g., wheelchair or bedside chair)?: None Help needed to walk in hospital room?: None Help needed climbing 3-5 steps with a railing? : A Little 6 Click Score: 23    End of Session   Activity Tolerance: Patient tolerated treatment well Patient left: with call bell/phone within reach(pt up in room on arrival and departure) Nurse Communication: Mobility status PT Visit Diagnosis: Other abnormalities of gait and mobility (R26.89)    Time: 2330-0762 PT Time Calculation (min) (ACUTE ONLY): 34 min   Charges:   PT Evaluation $PT Eval Low Complexity: 1 Low PT Treatments $Therapeutic Exercise: 8-22 mins        Baxter Flattery, PT   Acute Rehab Dept [facility]): [phone]   02/04/2020   [facility] 02/04/2020, 1:59 PM

## 2020-02-04 NOTE — Progress Notes (Signed)
Reviewed discharge paperwork with patient and daughter. Went over new prescription and pharmacy to pick it up at. Advised MRI appointment will have to be scheduled. Patient discharged via NT in wheelchair.

## 2020-02-04 NOTE — Discharge Summary (Signed)
Physician Discharge Summary  Allen Arias VXY:[id number] DOB: [birth year]   PCP: Bernerd Limbo, MD  Admit date: 02/03/2020 Discharge date: 02/04/2020 Length of Stay: 0 days   Code Status: Full Code  Admitted From:  Home Discharged to:   [place]:  None  Equipment/Devices:  None Discharge Condition:  Stable  Recommendations for Outpatient Follow-up   1. MRI pelvis to evaluate right hip adductors this week 2. Referred to orthopedic surgery for further evaluation 3. Follow-up ESR and CRP  Hospital Summary  Video interpreter helped with translation  This is a 79 year old [place] speaking male with a past medical history of BPH, hypertension who presented with 10 days of right leg bruising and pain which was sudden in onset.  He was in his normal state of health until about 10 days ago when he had relatively abrupt onset right calf swelling and pain and bruising.  Patient reported that he has been walking for 40 minutes daily and increasing his exercise recently as well but denied any recent trauma.  He went to his PCP who ordered an ultrasound and DVT was ruled out.  Went to an orthopedic urgent care and had an x-ray which ruled out fracture.  Continued to have symptoms and came to the ED.  In the ED he was noted to be hyponatremic with sodium 125 and lactic acid of 2.7 and was placed in observation overnight for further evaluation.  He underwent MRI of right tibia and fibula which showed nonspecific subcutaneous edema circumferentially on the right lower leg more prominent distally and more prominent anteriorly just above the ankle.  Hyponatremia and lactic acidosis resolved with IV fluids.  On physical exam on 5/30, patient was noted to have discomfort with adduction most notably with resistance as well as on resistance to internal rotation of the hip and also right anterior medial thigh tenderness to palpation.  This, in combination with his increased activity level recently, led to  concern for possible muscle overuse injury of right hip adductor's probably leading to visualized swelling and ecchymosis of right lower leg due to gravity.  Unfortunately, the MRI staff is limited at this hospital over the weekend and he was unable to get an MRI for evaluation of right leg adductor muscle tear while inpatient.  I discussed the proper study with the site radiologist.  Outpatient MRI pelvis ordered for evaluation of his proximal musculature.  He was discharged with short course of low-dose oxycodone for severe pain and referred to orthopedic surgery for outpatient follow-up.  This was also communicated with his daughter who is [place] speaking.   A & P   Principal Problem:   Hyponatremia Active Problems:   Hyperbilirubinemia   Essential hypertension   Purpura (HCC)   Dehydration   1. Hyponatremia 1. Resolved with IV fluids 2. Encourage p.o. intake at home  2. Atraumatic right lower leg swelling and ecchymosis, less likely purpura, In setting of increased activity level at home  1. MR right tibia/fibula with nonspecific findings above 2. Recent negative RLE Doppler for DVT and negative x-ray 3. Appears less likely vasculitic on exam but will check ESR and CRP 4. no known vascular disease, no murmurs, no bruits, no eosinophilia to indicate cholesterol embolism, no fever or pain to indicate septic emboli 5. Tenderness to palpation of right proximal-anteromedial RLE musculature as well as tenderness in same area with resistance to adduction of leg and tenderness in same area with resistance to internal rotation of hip -concerning for musculoskeletal etiology,  possibly partial muscle tear? Unfortunately unable to get a routine MRI over the weekend due to limited staff 6. No follow-up per PT 7. Outpatient MRI pelvis for further evaluation 8. Ambulatory referral to orthopedic surgery 9. Short course of low-dose oxycodone for severe/breakthrough pain 10. Elevate leg and  recommended compression stockings for swelling  3. Elevated indirect bilirubin 1. Probably from blood breakdown products from right lower extremity bruising 2. Follow-up LDH and haptoglobin  4. Elevated lactic acid 1. Probably from dehydration, resolved with IV fluids  5. Hypertension 1. Continue enalapril  6. BPH 1. Continue home meds     Consultants  . None  Procedures  . None  Antibiotics   Anti-infectives (From admission, onward)   None       Subjective   History obtained via video [place] translator  He reports that he walks about 40 minutes daily and has also tried to increase his exercise recently as well.  No traumatic events.  States that his pain, swelling and bruising has improved slightly over the course of time.  Denies any history of cardiac issues.  No other complaints, no overnight events  Objective   Discharge Exam: Vitals:   02/04/20 0557 02/04/20 1351  BP: (!) 141/82 (!) 145/90  Pulse: 72 89  Resp: 14 18  Temp: (!) 97.5 F (36.4 C) 97.7 F (36.5 C)  SpO2: 92% 96%   Vitals:   02/03/20 2040 02/04/20 0038 02/04/20 0557 02/04/20 1351  BP: (!) 157/88 140/87 (!) 141/82 (!) 145/90  Pulse: 96 87 72 89  Resp: 16 16 14 18   Temp: 98.1 F (36.7 C) 97.9 F (36.6 C) (!) 97.5 F (36.4 C) 97.7 F (36.5 C)  TempSrc: Oral Oral Oral Oral  SpO2: 95% 94% 92% 96%  Weight:      Height:        Physical Exam Vitals and nursing note reviewed.  Constitutional:      Appearance: Normal appearance.  HENT:     Head: Normocephalic and atraumatic.  Eyes:     Conjunctiva/sclera: Conjunctivae normal.  Cardiovascular:     Rate and Rhythm: Normal rate and regular rhythm.  Pulmonary:     Effort: Pulmonary effort is normal.     Breath sounds: Normal breath sounds.  Abdominal:     General: Abdomen is flat.     Palpations: Abdomen is soft.  Musculoskeletal:        General: Swelling and tenderness present.       Legs:  Skin:    Coloration: Skin is not  jaundiced or pale.     Findings: Bruising present.  Neurological:     Mental Status: He is alert. Mental status is at baseline.  Psychiatric:        Mood and Affect: Mood normal.        Behavior: Behavior normal.     (Picture from admission, similar to discharge exam)    The results of significant diagnostics from this hospitalization (including imaging, microbiology, ancillary and laboratory) are listed below for reference.     Microbiology: Recent Results (from the past 240 hour(s))  SARS Coronavirus 2 by RT PCR (hospital order, performed in [facility] hospital lab) Nasopharyngeal Nasopharyngeal Swab     Status: None   Collection Time: 02/03/20  8:04 AM   Specimen: Nasopharyngeal Swab  Result Value Ref Range Status   SARS Coronavirus 2 NEGATIVE NEGATIVE Final    Comment: (NOTE) SARS-CoV-2 target nucleic acids are NOT DETECTED. The SARS-CoV-2 RNA is generally  detectable in upper and lower respiratory specimens during the acute phase of infection. The lowest concentration of SARS-CoV-2 viral copies this assay can detect is 250 copies / mL. A negative result does not preclude SARS-CoV-2 infection and should not be used as the sole basis for treatment or other patient management decisions.  A negative result may occur with improper specimen collection / handling, submission of specimen other than nasopharyngeal swab, presence of viral mutation(s) within the areas targeted by this assay, and inadequate number of viral copies (<250 copies / mL). A negative result must be combined with clinical observations, patient history, and epidemiological information. Fact Sheet for Patients:   StrictlyIdeas.no Fact Sheet for Healthcare Providers: BankingDealers.co.za This test is not yet approved or cleared  by the [place] FDA and has been authorized for detection and/or diagnosis of SARS-CoV-2 by FDA under an Emergency Use  Authorization (EUA).  This EUA will remain in effect (meaning this test can be used) for the duration of the COVID-19 declaration under Section 564(b)(1) of the Act, 21 U.S.C. section 360bbb-3(b)(1), unless the authorization is terminated or revoked sooner. Performed at [facility], [place] [address] [place],  [address]      Labs: BNP (last 3 results) No results for input(s): BNP in the last 8760 hours. Basic Metabolic Panel: Recent Labs  Lab 02/03/20 0835 02/04/20 0529  NA 125* 134*  K 4.1 4.2  CL 93* 104  CO2 22 21*  GLUCOSE 142* 124*  BUN 16 21  CREATININE 0.80 0.72  CALCIUM 8.8* 8.8*   Liver Function Tests: Recent Labs  Lab 02/03/20 0835 02/03/20 1306 02/04/20 0529  AST 25  --  23  ALT 22  --  19  ALKPHOS 43  --  35*  BILITOT 4.4* 4.2* 4.0*  PROT 7.2  --  6.6  ALBUMIN 4.5  --  4.2   No results for input(s): LIPASE, AMYLASE in the last 168 hours. No results for input(s): AMMONIA in the last 168 hours. CBC: Recent Labs  Lab 02/03/20 0835 02/04/20 0529  WBC 4.3 9.2  NEUTROABS 3.9 8.3*  HGB 13.6 12.8*  HCT 39.4 37.4*  MCV 96.8 97.9  PLT 135* 145*   Cardiac Enzymes: Recent Labs  Lab 02/03/20 0835  CKTOTAL 135   BNP: Invalid input(s): POCBNP CBG: No results for input(s): GLUCAP in the last 168 hours. D-Dimer No results for input(s): DDIMER in the last 72 hours. Hgb A1c No results for input(s): HGBA1C in the last 72 hours. Lipid Profile No results for input(s): CHOL, HDL, LDLCALC, TRIG, CHOLHDL, LDLDIRECT in the last 72 hours. Thyroid function studies No results for input(s): TSH, T4TOTAL, T3FREE, THYROIDAB in the last 72 hours.  Invalid input(s): FREET3 Anemia work up No results for input(s): VITAMINB12, FOLATE, FERRITIN, TIBC, IRON, RETICCTPCT in the last 72 hours. Urinalysis No results found for: COLORURINE, APPEARANCEUR, [place], [name], [place], [place], [place], [place], PROTEINUR, UROBILINOGEN, NITRITE,  LEUKOCYTESUR Sepsis Labs Invalid input(s): PROCALCITONIN,  WBC,  LACTICIDVEN Microbiology Recent Results (from the past 240 hour(s))  SARS Coronavirus 2 by RT PCR (hospital order, performed in [facility] hospital lab) Nasopharyngeal Nasopharyngeal Swab     Status: None   Collection Time: 02/03/20  8:04 AM   Specimen: Nasopharyngeal Swab  Result Value Ref Range Status   SARS Coronavirus 2 NEGATIVE NEGATIVE Final    Comment: (NOTE) SARS-CoV-2 target nucleic acids are NOT DETECTED. The SARS-CoV-2 RNA is generally detectable in upper and lower respiratory specimens during the acute phase  of infection. The lowest concentration of SARS-CoV-2 viral copies this assay can detect is 250 copies / mL. A negative result does not preclude SARS-CoV-2 infection and should not be used as the sole basis for treatment or other patient management decisions.  A negative result may occur with improper specimen collection / handling, submission of specimen other than nasopharyngeal swab, presence of viral mutation(s) within the areas targeted by this assay, and inadequate number of viral copies (<250 copies / mL). A negative result must be combined with clinical observations, patient history, and epidemiological information. Fact Sheet for Patients:   StrictlyIdeas.no Fact Sheet for Healthcare Providers: BankingDealers.co.za This test is not yet approved or cleared  by the [place] FDA and has been authorized for detection and/or diagnosis of SARS-CoV-2 by FDA under an Emergency Use Authorization (EUA).  This EUA will remain in effect (meaning this test can be used) for the duration of the COVID-19 declaration under Section 564(b)(1) of the Act, 21 U.S.C. section 360bbb-3(b)(1), unless the authorization is terminated or revoked sooner. Performed at [facility], [place] [address], [place], [place] [address]     Discharge Instructions      Discharge Instructions    AMB referral to orthopedics   Complete by: As directed    Sudden onset right lower leg swelling/ecchymosis with right hip/groin pain, possible overuse injury. Negative doppler, MRI tib/fib with nonspecific edema. Outpatient MRI pelvis to evaluate for adductor tear ordered at discharge.   Diet - low sodium heart healthy   Complete by: As directed    Discharge instructions   Complete by: As directed    - schedule an MRI to evaluate for a muscle tear of your right hip - take Tylenol 500 mg every 6 hours as needed for mild-moderate pain. You can take Oxycodone every 6 hours as needed for severe pain. - follow up with the orthopedic surgeon in 1-2 weeks if your swelling/pain persists. If her symptoms improve then you can cancel this appointment - keep your right leg elevated throughout the day to help reduce the swelling. You can get compression stockings and wear this as well to help reduce the swelling   Increase activity slowly   Complete by: As directed      Allergies as of 02/04/2020   No Known Allergies     Medication List    TAKE these medications   Aspirin Buf(CaCarb-MgCarb-MgO) 81 MG Tabs Take 81 mg by mouth as needed for pain.   dutasteride 0.5 MG capsule Commonly known as: AVODART Take 0.5 mg by mouth daily.   enalapril 10 MG tablet Commonly known as: VASOTEC Take 20 mg by mouth daily.   ketoconazole 2 % shampoo Commonly known as: NIZORAL Apply 1 application topically as needed for rash.   oxyCODONE 5 MG immediate release tablet Commonly known as: Roxicodone Take 0.5 tablets (2.5 mg total) by mouth every 6 (six) hours as needed for up to 3 days for severe pain.   simvastatin 20 MG tablet Commonly known as: ZOCOR Take 20 mg by mouth at bedtime.   tamsulosin 0.4 MG Caps capsule Commonly known as: FLOMAX Take 0.8 mg by mouth daily.       No Known Allergies   Dispo: The patient is from: Home              Anticipated d/c is  to: Home              Anticipated d/c date is: Today  Patient currently is medically stable to d/c.       Time coordinating discharge: Over 30 minutes   SIGNED:   Harold Hedge, D.O. [facility] Pager: [phone]  02/04/2020, 3:21 PM

## 2020-02-09 ENCOUNTER — Ambulatory Visit (HOSPITAL_COMMUNITY)
Admission: RE | Admit: 2020-02-09 | Discharge: 2020-02-09 | Disposition: A | Discharge status: Home / Self Care | Payer: Medicare Other | Source: Ambulatory Visit | Attending: Internal Medicine | Admitting: Internal Medicine

## 2020-02-09 ENCOUNTER — Other Ambulatory Visit: Payer: Self-pay

## 2020-02-09 DIAGNOSIS — M7989 Other specified soft tissue disorders: Secondary | ICD-10-CM | POA: Insufficient documentation

## 2020-02-09 IMAGING — MR MR PELVIS W/O CM
5 of 6 series · 34 of 48 positions shown · non-contrast
Comparison: None.

CLINICAL DATA: Pelvic trauma. Persistent medial thigh pain and
swelling.

EXAM:
MRI PELVIS WITHOUT CONTRAST
TECHNIQUE: Multiplanar multisequence MR imaging of the pelvis was performed. No
intravenous contrast was administered.

[Series 9: T1 · coronal · 5.0mm · 1.19mm/px · 4 of 25 slices shown (1 of 2)]
[im 1/25]
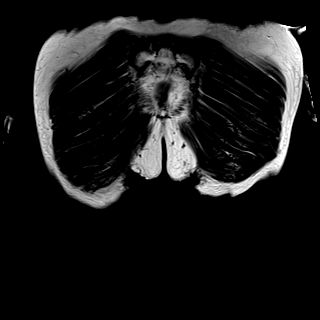
[im 9/25]
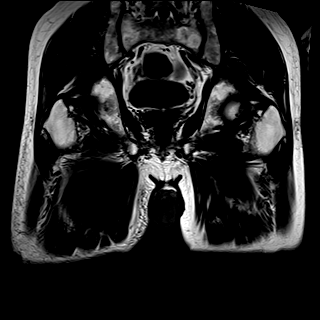
[im 17/25]
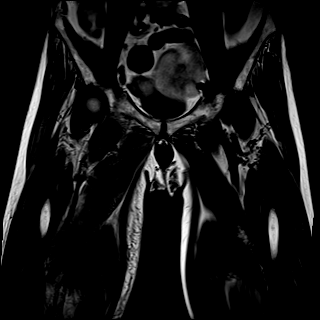
[im 25/25]
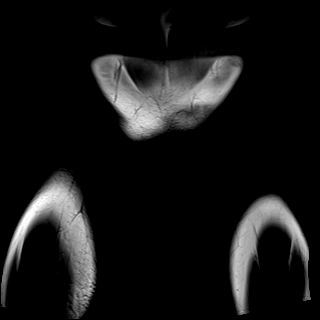

[Series 10: STIR · coronal · 5.0mm · 1.19mm/px · 4 of 25 slices shown (1 of 2)]
[im 1/25]
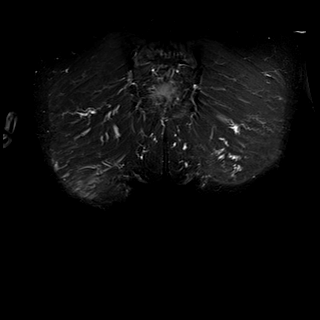
[im 9/25]
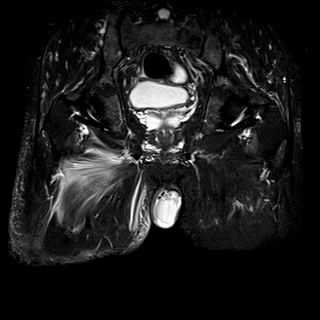
[im 17/25]
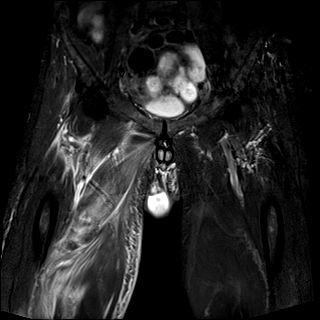
[im 25/25]
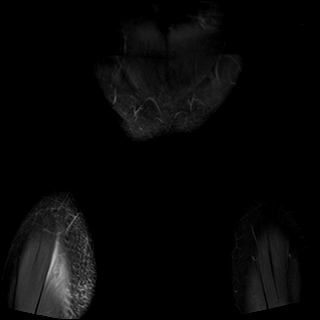

[Series 11: T1 · axial · 4.0mm · 1.16mm/px · z∈[-204,+116]mm · 9 of 65 slices shown (2 of 2)]
[im 1/65]
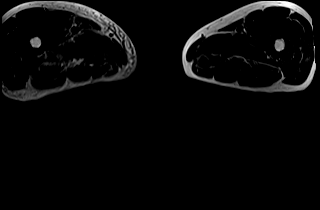
[im 9/65]
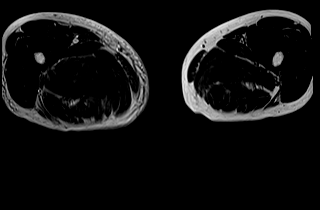
[im 17/65]
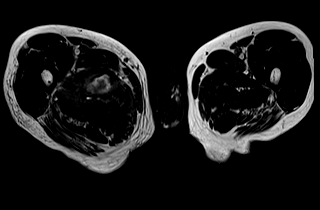
[im 25/65]
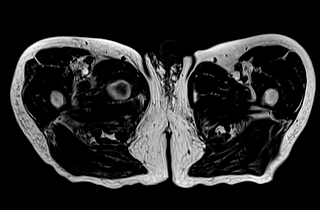
[im 33/65]
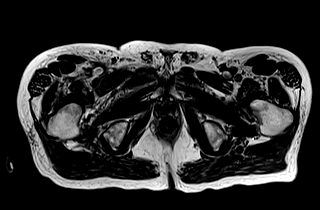
[im 41/65]
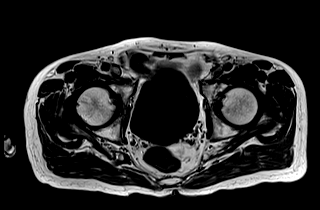
[im 49/65]
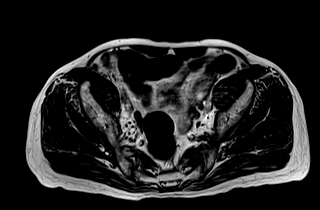
[im 57/65]
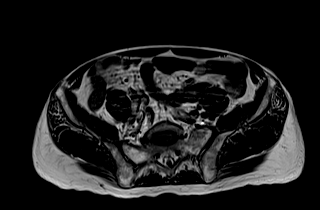
[im 65/65]
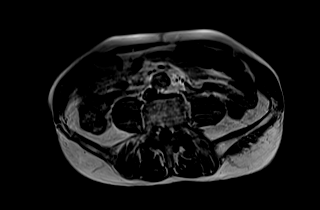

[Series 12: T2 fat-sat · axial · 4.0mm · 1.45mm/px · z∈[-204,+116]mm · 9 of 65 slices shown]
[im 1/65]
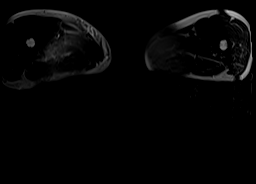
[im 9/65]
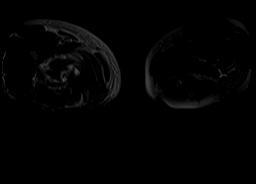
[im 17/65]
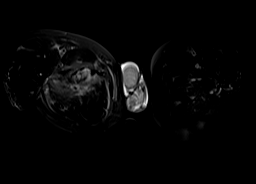
[im 25/65]
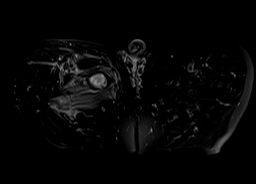
[im 33/65]
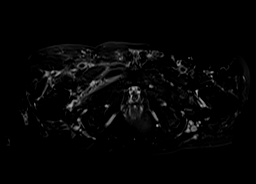
[im 41/65]
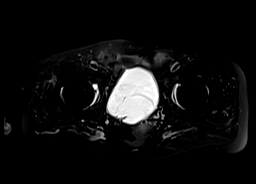
[im 49/65]
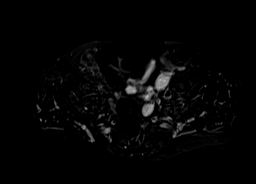
[im 57/65]
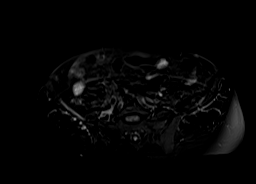
[im 65/65]
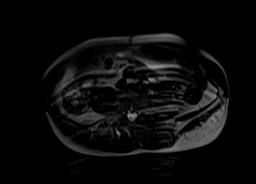

[Series 13: STIR · sagittal · 5.0mm · 1.45mm/px · 8 of 58 slices shown (2 of 2)]
[im 1/58]
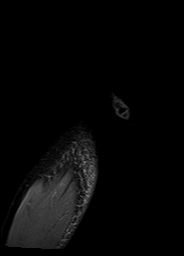
[im 9/58]
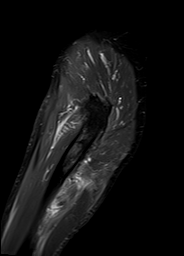
[im 17/58]
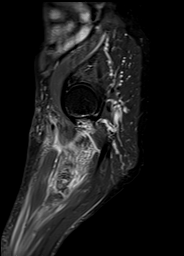
[im 25/58]
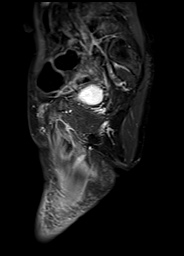
[im 33/58]
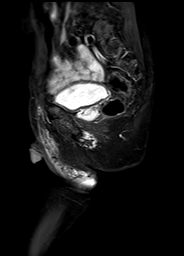
[im 41/58]
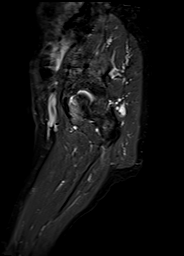
[im 49/58]
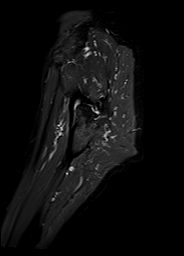
[im 58/58]
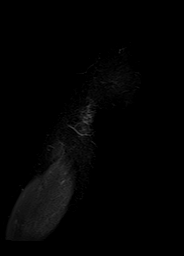

[34 of 48 positions shown; findings below may reference images not displayed]

FINDINGS: There is a fairly extensive tear involving the adductor muscles of
the right hip and thigh. This mainly involves the adductor longus
and adductor brevis muscles. Extensive tearing along the
musculotendinous junction regions extending into the muscle bellies.
There is a moderate-sized partially liquified hematoma in the
adductor longus muscle measuring approximately 7.1 x 3.4 cm. The
tendinous attachments are intact.

The quadriceps musculature is intact. The hamstring muscles are also
intact. The hamstring tendons are intact.

Both hips are normally located. Mild to moderate degenerative
changes but no fracture or AVN. The pubic symphysis and SI joints
are intact. No pelvic fractures or bone lesions.
IMPRESSION: 1. Fairly extensive tear involving the adductor muscles of the right
hip and thigh. This mainly involves the adductor longus and adductor
brevis muscles.
2. Moderate-sized partially liquified hematoma in the adductor
longus muscle measuring approximately 7.1 x 3.4 cm.
3. Intact bony pelvis.
4. Mild to moderate bilateral hip joint degenerative changes but no
fracture or AVN.

## 2020-02-10 ENCOUNTER — Ambulatory Visit (HOSPITAL_BASED_OUTPATIENT_CLINIC_OR_DEPARTMENT_OTHER): Payer: Medicare Other

## 2020-02-11 NOTE — Progress Notes (Signed)
Regarding the MRI result, the patient has an orthopedic surgery appointment scheduled for tomorrow with Dr. Lajoyce Corners to discuss.   Jae Dire, DO

## 2020-02-12 ENCOUNTER — Encounter: Payer: Self-pay | Admitting: Orthopedic Surgery

## 2020-02-12 ENCOUNTER — Other Ambulatory Visit: Payer: Self-pay

## 2020-02-12 ENCOUNTER — Ambulatory Visit (INDEPENDENT_AMBULATORY_CARE_PROVIDER_SITE_OTHER): Payer: Medicare Other | Admitting: Orthopedic Surgery

## 2020-02-12 VITALS — Ht 62.0 in | Wt 124.0 lb

## 2020-02-12 DIAGNOSIS — S8011XA Contusion of right lower leg, initial encounter: Secondary | ICD-10-CM

## 2020-02-12 NOTE — Progress Notes (Signed)
Office Visit Note   Patient: Allen Arias           Date of Birth: [birth year]           MRN: [id number] Visit Date: 02/12/2020              Requested by: Jae Dire, MD [address] [place],  [place] [address] PCP: [name], MD  Chief Complaint  Patient presents with  . Right Leg - Pain, New Patient (Initial Visit)  . Right Hip - Pain      HPI: Patient is a 79 year old gentleman who is seen for initial evaluation for ecchymosis and bruising involving the right thigh right leg and right foot. Patient has an MRI scan that shows very traumatic tear of the abductor muscles patient is unaware of the trauma that caused this. He does not communicate or speak during his visit his family member interprets and does the talking for him.  Assessment & Plan: Visit Diagnoses:  1. Traumatic hematoma of right lower leg, initial encounter     Plan: With the swelling in his leg I recommended knee-high compression socks recommended elevation of his right leg 11 with his heart and recommended walking to stimulate the calf pump to help decrease the swelling. We will reevaluate in 2 weeks.  Follow-Up Instructions: Return in about 2 weeks (around 02/26/2020).   Ortho Exam  Patient is Arias, oriented, no adenopathy, well-dressed, normal affect, normal respiratory effort. Examination patient has ecchymosis and bruising extends from the right groin all the way down to the right foot with swelling. Patient has been to the emergency room several times ultrasound is negative for DVT radiographs are negative for fractures. Review of the MRI scan shows tearing of the abductor muscle on the right with hematoma on the right side no evidence of fractures. There is pitting edema in the right leg there is no ulcers or drainage. There is no cellulitis. The calf is soft no evidence of DVT. Imaging: No results found. No images are attached to the encounter.  Labs: Lab Results  Component Value Date   ESRSEDRATE 0 02/03/2020   CRP <0.5 02/03/2020     Lab Results  Component Value Date   ALBUMIN 4.2 02/04/2020   ALBUMIN 4.5 02/03/2020    No results found for: MG No results found for: VD25OH  No results found for: PREALBUMIN CBC EXTENDED Latest Ref Rng & Units 02/04/2020 02/03/2020  WBC 4.0 - 10.5 K/uL 9.2 4.3  RBC 4.22 - 5.81 MIL/uL 3.82(L) 4.07(L)  HGB 13.0 - 17.0 g/dL 12.8(L) 13.6  HCT 39.0 - 52.0 % 37.4(L) 39.4  PLT 150 - 400 K/uL 145(L) 135(L)  NEUTROABS 1.7 - 7.7 K/uL 8.3(H) 3.9  LYMPHSABS 0.7 - 4.0 K/uL 0.4(L) 0.2(L)     Body mass index is 22.68 kg/m.  Orders:  No orders of the defined types were placed in this encounter.  No orders of the defined types were placed in this encounter.    Procedures: No procedures performed  Clinical Data: No additional findings.  ROS:  All other systems negative, except as noted in the HPI. Review of Systems  Objective: Vital Signs: Ht 5\' 2"  (1.575 m)   Wt 124 lb (56.2 kg)   BMI 22.68 kg/m   Specialty Comments:  No specialty comments available.  PMFS History: Patient Active Problem List   Diagnosis Date Noted  . Hyponatremia 02/03/2020  . Hyperbilirubinemia 02/03/2020  . Essential hypertension 02/03/2020  . Purpura (HCC) 02/03/2020  .  Dehydration 02/03/2020   Past Medical History:  Diagnosis Date  . Hypertension     Family History  Problem Relation Age of Onset  . Gallbladder disease Mother        cholangiocarcinoma  . Gastric cancer Father     Past Surgical History:  Procedure Laterality Date  . CARPAL TUNNEL RELEASE     Social History   Occupational History  . Not on file  Tobacco Use  . Smoking status: Never Smoker  . Smokeless tobacco: Never Used  Substance and Sexual Activity  . Alcohol use: Not on file  . Drug use: Not on file  . Sexual activity: Not on file

## 2020-02-26 ENCOUNTER — Ambulatory Visit: Payer: Medicare Other | Admitting: Orthopedic Surgery

## 2021-10-15 ENCOUNTER — Ambulatory Visit (INDEPENDENT_AMBULATORY_CARE_PROVIDER_SITE_OTHER): Payer: Medicare Other | Admitting: Vascular Surgery

## 2021-10-15 ENCOUNTER — Encounter: Payer: Self-pay | Admitting: Vascular Surgery

## 2021-10-15 ENCOUNTER — Other Ambulatory Visit: Payer: Self-pay

## 2021-10-15 VITALS — BP 163/81 | HR 73 | Temp 98.3°F | Resp 20 | Ht 62.0 in | Wt 116.4 lb

## 2021-10-15 DIAGNOSIS — I7143 Infrarenal abdominal aortic aneurysm, without rupture: Secondary | ICD-10-CM | POA: Diagnosis not present

## 2021-10-15 NOTE — Progress Notes (Signed)
Patient ID: Allen Arias, male   DOB: 06-19-1941, 81 y.o.   MRN: [id number]  Reason for Consult: New Patient (Initial Visit)   Referred by [name], MD  Subjective:     HPI:  Allen Arias is a 81 y.o. male recently found to have abdominal aortic aneurysm.  He does not have any personal or family history of aneurysm disease in the past.  He does not take blood thinners.  He denies previous abdominal surgery.  No previous vascular or heart interventions.  No new back or abdominal pain.  Denies any problems today.  Patient does take aspirin and a statin.  All information obtained via video interpreter.  Past Medical History:  Diagnosis Date   AAA (abdominal aortic aneurysm)    Hypertension    Family History  Problem Relation Age of Onset   Gallbladder disease Mother        cholangiocarcinoma   Gastric cancer Father    Past Surgical History:  Procedure Laterality Date   CARPAL TUNNEL RELEASE      Short Social History:  Social History   Tobacco Use   Smoking status: Never   Smokeless tobacco: Never  Substance Use Topics   Alcohol use: Not on file    No Known Allergies  Current Outpatient Medications  Medication Sig Dispense Refill   Aspirin Buf,CaCarb-MgCarb-MgO, 81 MG TABS Take 81 mg by mouth as needed for pain.     dutasteride (AVODART) 0.5 MG capsule Take 0.5 mg by mouth daily.     enalapril (VASOTEC) 10 MG tablet Take 20 mg by mouth daily.     ketoconazole (NIZORAL) 2 % shampoo Apply 1 application topically as needed for rash.     simvastatin (ZOCOR) 20 MG tablet Take 20 mg by mouth at bedtime.     tamsulosin (FLOMAX) 0.4 MG CAPS capsule Take 0.8 mg by mouth daily.     No current facility-administered medications for this visit.    Review of Systems  Constitutional:  Constitutional negative. HENT: HENT negative.  Eyes: Eyes negative.  Respiratory: Respiratory negative.  Cardiovascular: Cardiovascular negative.  GI: Gastrointestinal negative.   Musculoskeletal: Musculoskeletal negative.  Skin: Skin negative.  Neurological: Neurological negative. Hematologic: Hematologic/lymphatic negative.  Psychiatric: Psychiatric negative.       Objective:  Objective   Vitals:   10/15/21 1121  BP: (!) 163/81  Pulse: 73  Resp: 20  Temp: 98.3 F (36.8 C)  SpO2: 96%  Weight: 116 lb 6.4 oz (52.8 kg)  Height: 5\' 2"  (1.575 m)   Body mass index is 21.29 kg/m.  Physical Exam HENT:     Head: Normocephalic.     Nose:     Comments: Wearing a mask Eyes:     Pupils: Pupils are equal, round, and reactive to light.  Neck:     Vascular: No carotid bruit.  Cardiovascular:     Rate and Rhythm: Normal rate.     Pulses:          Popliteal pulses are 2+ on the right side and 3+ on the left side.     Heart sounds: Normal heart sounds.  Pulmonary:     Effort: Pulmonary effort is normal.     Breath sounds: Normal breath sounds.  Abdominal:     General: Abdomen is flat.     Palpations: Abdomen is soft. There is mass.  Musculoskeletal:        General: Normal range of motion.     Right lower leg: No edema.  Left lower leg: No edema.  Skin:    Capillary Refill: Capillary refill takes less than 2 seconds.  Neurological:     General: No focal deficit present.     Mental Status: He is alert.  Psychiatric:        Mood and Affect: Mood normal.        Behavior: Behavior normal.    Data: Proximal aorta is 3.0 x 2.4 cm in cross-sectional diameter, midportion 2.2 x 2.5 cm. The distal infrarenal abdominal aorta measures up to 5.1 x 4.6 cm in diameter with scattered intramural thrombus. The common iliac arteries are patent and normal in diameter at 1.3 and 1.4 cm on the right and left respectively. Scattered mildly complicated renal cysts are noted. Some small gallstones are seen.    IMPRESSION:   A 5.1 x 4.6 cm infrarenal abdominal aortic aneurysm. Anatomic characteristics would be best evaluated with CTA.      Assessment/Plan:      81 year old male recently found to have 5 cm abdominal aortic aneurysm.  I discussed with the patient and his daughter via interpreter at greater than 5 cm aneurysm has approximately 5% risk for rupture each year.  We will plan to evaluate with CT scan.  He also has a very prominent left popliteal artery which we will evaluate with duplex.  I will see him back when the studies are complete     Maeola Harman MD [facility]

## 2021-10-21 ENCOUNTER — Other Ambulatory Visit: Payer: Self-pay | Admitting: *Deleted

## 2021-10-21 DIAGNOSIS — I7143 Infrarenal abdominal aortic aneurysm, without rupture: Secondary | ICD-10-CM

## 2021-10-24 ENCOUNTER — Other Ambulatory Visit: Payer: Self-pay

## 2021-10-24 DIAGNOSIS — I7143 Infrarenal abdominal aortic aneurysm, without rupture: Secondary | ICD-10-CM

## 2021-10-28 ENCOUNTER — Encounter: Payer: Self-pay | Admitting: Vascular Surgery

## 2021-10-30 NOTE — Telephone Encounter (Signed)
Patient has been scheduled and notified.

## 2021-11-04 ENCOUNTER — Ambulatory Visit
Admission: RE | Admit: 2021-11-04 | Discharge: 2021-11-04 | Disposition: A | Discharge status: Home / Self Care | Payer: Medicare Other | Source: Ambulatory Visit | Attending: Vascular Surgery | Admitting: Vascular Surgery

## 2021-11-04 DIAGNOSIS — I7143 Infrarenal abdominal aortic aneurysm, without rupture: Secondary | ICD-10-CM

## 2021-11-04 MED ORDER — IOPAMIDOL (ISOVUE-370) INJECTION 76%
75.0000 mL | Freq: Once | INTRAVENOUS | Status: AC | PRN
  Administered 2021-11-04: 75 mL via INTRAVENOUS

## 2021-11-12 ENCOUNTER — Encounter (HOSPITAL_COMMUNITY): Payer: Medicare Other

## 2021-11-12 ENCOUNTER — Ambulatory Visit (HOSPITAL_COMMUNITY)
Admission: RE | Admit: 2021-11-12 | Discharge: 2021-11-12 | Disposition: A | Discharge status: Home / Self Care | Payer: Medicare Other | Source: Ambulatory Visit | Attending: Vascular Surgery | Admitting: Vascular Surgery

## 2021-11-12 ENCOUNTER — Ambulatory Visit (INDEPENDENT_AMBULATORY_CARE_PROVIDER_SITE_OTHER)
Admission: RE | Admit: 2021-11-12 | Discharge: 2021-11-12 | Disposition: A | Discharge status: Home / Self Care | Payer: Medicare Other | Source: Ambulatory Visit | Attending: Vascular Surgery | Admitting: Vascular Surgery

## 2021-11-12 ENCOUNTER — Other Ambulatory Visit: Payer: Self-pay

## 2021-11-12 ENCOUNTER — Encounter: Payer: Self-pay | Admitting: Vascular Surgery

## 2021-11-12 ENCOUNTER — Ambulatory Visit (INDEPENDENT_AMBULATORY_CARE_PROVIDER_SITE_OTHER): Payer: Medicare Other | Admitting: Vascular Surgery

## 2021-11-12 ENCOUNTER — Inpatient Hospital Stay (HOSPITAL_COMMUNITY): Admission: RE | Admit: 2021-11-12 | Payer: Medicare Other | Source: Ambulatory Visit

## 2021-11-12 VITALS — BP 153/91 | HR 65 | Temp 98.3°F | Resp 20 | Ht 62.0 in | Wt 115.0 lb

## 2021-11-12 DIAGNOSIS — I7143 Infrarenal abdominal aortic aneurysm, without rupture: Secondary | ICD-10-CM | POA: Insufficient documentation

## 2021-11-12 NOTE — Progress Notes (Signed)
Patient ID: Allen Arias, male   DOB: February 16, 1941, 81 y.o.   MRN: [id number]  Reason for Consult: Follow-up   Referred by [name], MD  Subjective:     HPI:  Allen Arias is a 81 y.o. male otherwise healthy male from [place] recently found to have abdominal aortic aneurysm.  He now follows up with CT scan and also lower extremity duplexes to evaluate for popliteal aneurysms.  He has not no new symptoms.  Denies any back or abdominal pain.  Overall he has been doing well.  Patient and his family refused COVID interpreter and preferred to use the daughter as they are interpreter she does demonstrate very good understanding and appears to translate adequately.  Past Medical History:  Diagnosis Date   AAA (abdominal aortic aneurysm)    Hypertension    Family History  Problem Relation Age of Onset   Gallbladder disease Mother        cholangiocarcinoma   Gastric cancer Father    Past Surgical History:  Procedure Laterality Date   CARPAL TUNNEL RELEASE      Short Social History:  Social History   Tobacco Use   Smoking status: Never   Smokeless tobacco: Never  Substance Use Topics   Alcohol use: Not on file    No Known Allergies  Current Outpatient Medications  Medication Sig Dispense Refill   Aspirin Buf,CaCarb-MgCarb-MgO, 81 MG TABS Take 81 mg by mouth as needed for pain.     dutasteride (AVODART) 0.5 MG capsule Take 0.5 mg by mouth daily.     enalapril (VASOTEC) 10 MG tablet Take 20 mg by mouth daily.     ketoconazole (NIZORAL) 2 % shampoo Apply 1 application topically as needed for rash.     simvastatin (ZOCOR) 20 MG tablet Take 20 mg by mouth at bedtime.     tamsulosin (FLOMAX) 0.4 MG CAPS capsule Take 0.8 mg by mouth daily.     No current facility-administered medications for this visit.    Review of Systems  Constitutional:  Constitutional negative. HENT: HENT negative.  Eyes: Eyes negative.  Respiratory: Respiratory negative.  Cardiovascular: Cardiovascular  negative.  GI: Gastrointestinal negative.  Musculoskeletal: Musculoskeletal negative.  Skin: Skin negative.  Neurological: Neurological negative. Hematologic: Hematologic/lymphatic negative.  Psychiatric: Psychiatric negative.       Objective:  Objective   Vitals:   11/12/21 0957  BP: (!) 153/91  Pulse: 65  Resp: 20  Temp: 98.3 F (36.8 C)  SpO2: 97%  Weight: 115 lb (52.2 kg)  Height: 5\' 2"  (1.575 m)   Body mass index is 21.03 kg/m.  Physical Exam HENT:     Head: Normocephalic.     Nose:     Comments: Wearing a mask Eyes:     Pupils: Pupils are equal, round, and reactive to light.  Cardiovascular:     Pulses:          Radial pulses are 2+ on the right side and 2+ on the left side.       Popliteal pulses are 2+ on the right side and 2+ on the left side.  Pulmonary:     Effort: Pulmonary effort is normal.  Abdominal:     General: Abdomen is flat.     Palpations: Abdomen is soft. There is mass.  Musculoskeletal:        General: Normal range of motion.     Right lower leg: No edema.     Left lower leg: No edema.  Skin:  General: Skin is warm and dry.     Capillary Refill: Capillary refill takes less than 2 seconds.  Neurological:     General: No focal deficit present.     Mental Status: He is alert.  Psychiatric:        Mood and Affect: Mood normal.        Behavior: Behavior normal.    Data: CT IMPRESSION: Vascular Impression:   1. Large infrarenal abdominal aortic aneurysm measuring 5.4 cm in maximal diameter, increased in size compared to remote aortic ultrasound performed in 2013, at which time the abdominal aorta measuring 3.5 cm. Abdominal Aortic Aneurysm (ICD10-I71.9). Vascular surgery referral/consultation is recommended due to increased risk for rupture. This recommendation follows ACR consensus guidelines: White Paper of the ACR Incidental Findings Committee II on Vascular Findings. J Am Coll Radiol 2013; 10:789-794. 2. Large amount of  irregular calcified and noncalcified atherosclerotic plaque throughout the imaged caudal aspect of the thoracic aorta which appears at least ectatic measuring 38 mm in diameter. Further evaluation with dedicated chest CTA could be performed as indicated. Aortic Atherosclerosis (ICD10-I70.0). 3. 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 4. Large amount of atherosclerotic plaque results in short segment at least 75% luminal narrowing of the mildly ectatic right common iliac artery which measures 1.8 cm diameter. 5. Mild ectasia of the left common iliac artery measuring 1.6 cm in diameter.   RIGHT       PSV cm/s Ratio Stenosis        Waveform Comments   +-----------+--------+-----+---------------+--------+--------+   CFA Prox    99                             biphasic            +-----------+--------+-----+---------------+--------+--------+   CFA Distal  66                             biphasic            +-----------+--------+-----+---------------+--------+--------+   DFA         52                             biphasic            +-----------+--------+-----+---------------+--------+--------+   SFA Prox    53                             biphasic            +-----------+--------+-----+---------------+--------+--------+   SFA Mid     26                             biphasic            +-----------+--------+-----+---------------+--------+--------+   SFA Distal  287            50-74% stenosis biphasic            +-----------+--------+-----+---------------+--------+--------+   POP Prox    24  biphasic            +-----------+--------+-----+---------------+--------+--------+   POP Distal  22                             biphasic             +-----------+--------+-----+---------------+--------+--------+   ATA Distal  20                             biphasic            +-----------+--------+-----+---------------+--------+--------+   PTA Distal  14                             biphasic            +-----------+--------+-----+---------------+--------+--------+   PERO Distal 45                             biphasic            +-----------+--------+-----+---------------+--------+--------+       +-----------+--------+-----+---------------+--------+--------+   LEFT        PSV cm/s Ratio Stenosis        Waveform Comments   +-----------+--------+-----+---------------+--------+--------+   CFA Prox    123                            biphasic            +-----------+--------+-----+---------------+--------+--------+   CFA Distal  100                            biphasic            +-----------+--------+-----+---------------+--------+--------+   DFA         119                            biphasic            +-----------+--------+-----+---------------+--------+--------+   SFA Prox    90                             biphasic            +-----------+--------+-----+---------------+--------+--------+   SFA Mid     66                             biphasic            +-----------+--------+-----+---------------+--------+--------+   SFA Distal  273            50-74% stenosis biphasic            +-----------+--------+-----+---------------+--------+--------+   POP Prox    45                             biphasic            +-----------+--------+-----+---------------+--------+--------+   POP Distal  24                             biphasic            +-----------+--------+-----+---------------+--------+--------+  ATA Distal  17                             biphasic            +-----------+--------+-----+---------------+--------+--------+   PTA Distal  30                             biphasic             +-----------+--------+-----+---------------+--------+--------+   PERO Distal 29                             biphasic            +-----------+--------+-----+---------------+--------+--------+         Summary:  Right: Elevated velocities suggest 50-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   Left: Elevated velocities suggest 50-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    There is no evidence of aneurysm in the bilateral lower extremity  arteries.      Assessment/Plan:     81 year old male with what appears to be greater than 5.5 cm aneurysm by my measurement today.  I reviewed the CT scan with the patient and his daughter today.  We have discussed the options being endovascular versus open repair and we further discussed endovascular repair likely requiring 1-2 nights in the hospital with possible need for further procedures in the future to treat endoleak's.  Patient's daughter demonstrates good understanding and translate to the father as they have refused COVID interpretation.  Both demonstrate understanding.  They will call to schedule.     Maeola Harman MD [facility]

## 2021-11-14 ENCOUNTER — Other Ambulatory Visit: Payer: Self-pay

## 2021-11-14 DIAGNOSIS — I7143 Infrarenal abdominal aortic aneurysm, without rupture: Secondary | ICD-10-CM

## 2021-11-14 DIAGNOSIS — Z419 Encounter for procedure for purposes other than remedying health state, unspecified: Secondary | ICD-10-CM

## 2021-12-04 ENCOUNTER — Encounter: Payer: Self-pay | Admitting: Vascular Surgery

## 2021-12-17 NOTE — Progress Notes (Signed)
Surgical Instructions ? ? ? Your procedure is scheduled on 12/26/21. ? Report to [facility] Main Entrance "A" at 5:30 A.M., then check in with the Admitting office. ? Call this number if you have problems the morning of surgery: ? [phone] ? ? If you have any questions prior to your surgery date call [phone]: Open Monday-Friday 8am-4pm ? ? ? Remember: ? Do not eat or drink after midnight the night before your surgery ? ? ?  ? Take these medicines the morning of surgery with A SIP OF WATER:  ?dutasteride (AVODART) ?tamsulosin [facility])  ? ? ?As of today, STOP taking any Aspirin (unless otherwise instructed by your surgeon) Aleve, Naproxen, Ibuprofen, Motrin, Advil, Goody's, BC's, all herbal medications, fish oil, and all vitamins. ? ?         ?Do not wear jewelry  ?Do not wear lotions, powders, colognes, or deodorant. ?Do not shave 48 hours prior to surgery.  Men may shave face and neck. ?Do not bring valuables to the hospital. ? ? ?[facility] is not responsible for any belongings or valuables. .  ? ?Do NOT Smoke (Tobacco/Vaping)  24 hours prior to your procedure ? ?If you use a CPAP at night, you may bring your mask for your overnight stay. ?  ?Contacts, glasses, hearing aids, dentures or partials may not be worn into surgery, please bring cases for these belongings ?  ?For patients admitted to the hospital, discharge time will be determined by your treatment team. ?  ?Patients discharged the day of surgery will not be allowed to drive home, and someone needs to stay with them for 24 hours. ? ? ?SURGICAL WAITING ROOM VISITATION ?Patients having surgery or a procedure in a hospital may have two support people. ?Children under the age of 71 must have an adult with them who is not the patient. ?They may stay in the waiting area during the procedure and may switch out with other visitors. If the patient needs to stay at the hospital during part of their recovery, the visitor guidelines for inpatient rooms  apply. ? ?Please refer to the [facility] website for the visitor guidelines for Inpatients (after your surgery is over and you are in a regular room).  ? ? ? ?Special instructions:   ? ?Oral Hygiene is also important to reduce your risk of infection.  Remember - BRUSH YOUR TEETH THE MORNING OF SURGERY WITH YOUR REGULAR TOOTHPASTE ? ? ?[facility]- Preparing For Surgery ? ?Before surgery, you can play an important role. Because skin is not sterile, your skin needs to be as free of germs as possible. You can reduce the number of germs on your skin by washing with CHG (chlorahexidine gluconate) Soap before surgery.  CHG is an antiseptic cleaner which kills germs and bonds with the skin to continue killing germs even after washing.   ? ? ?Please do not use if you have an allergy to CHG or antibacterial soaps. If your skin becomes reddened/irritated stop using the CHG.  ?Do not shave (including legs and underarms) for at least 48 hours prior to first CHG shower. It is OK to shave your face. ? ?Please follow these instructions carefully. ?  ? ? Shower the NIGHT BEFORE SURGERY and the MORNING OF SURGERY with CHG Soap.  ? If you chose to wash your hair, wash your hair first as usual with your normal shampoo. After you shampoo, rinse your hair and body thoroughly to remove the shampoo.  Then [employer] and  genitals (private parts) with your normal soap and rinse thoroughly to remove soap. ? ?After that Use CHG Soap as you would any other liquid soap. You can apply CHG directly to the skin and wash gently with a scrungie or a clean washcloth.  ? ?Apply the CHG Soap to your body ONLY FROM THE NECK DOWN.  Do not use on open wounds or open sores. Avoid contact with your eyes, ears, mouth and genitals (private parts). Wash Face and genitals (private parts)  with your normal soap.  ? ?Wash thoroughly, paying special attention to the area where your surgery will be performed. ? ?Thoroughly rinse your body with warm water from the  neck down. ? ?DO NOT shower/wash with your normal soap after using and rinsing off the CHG Soap. ? ?Pat yourself dry with a CLEAN TOWEL. ? ?Wear CLEAN PAJAMAS to bed the night before surgery ? ?Place CLEAN SHEETS on your bed the night before your surgery ? ?DO NOT SLEEP WITH PETS. ? ? ?Day of Surgery: ? ?Take a shower with CHG soap. ?Wear Clean/Comfortable clothing the morning of surgery ?Do not apply any deodorants/lotions.   ?Remember to brush your teeth WITH YOUR REGULAR TOOTHPASTE. ? ? ? ?If you received a COVID test during your pre-op visit  it is requested that you wear a mask when out in public, stay away from anyone that may not be feeling well and notify your surgeon if you develop symptoms. If you have been in contact with anyone that has tested positive in the last 10 days please notify you surgeon. ? ?  ?Please read over the following fact sheets that you were given.   ?

## 2021-12-18 ENCOUNTER — Encounter (HOSPITAL_COMMUNITY): Payer: Self-pay

## 2021-12-18 ENCOUNTER — Other Ambulatory Visit: Payer: Self-pay

## 2021-12-18 ENCOUNTER — Encounter (HOSPITAL_COMMUNITY)
Admission: RE | Admit: 2021-12-18 | Discharge: 2021-12-18 | Disposition: A | Discharge status: Home / Self Care | Payer: Medicare Other | Source: Ambulatory Visit | Attending: Vascular Surgery | Admitting: Vascular Surgery

## 2021-12-18 VITALS — BP 155/92 | HR 78 | Temp 98.0°F | Resp 18 | Ht 64.0 in | Wt 115.6 lb

## 2021-12-18 DIAGNOSIS — Z419 Encounter for procedure for purposes other than remedying health state, unspecified: Secondary | ICD-10-CM

## 2021-12-18 DIAGNOSIS — I7143 Infrarenal abdominal aortic aneurysm, without rupture: Secondary | ICD-10-CM | POA: Diagnosis not present

## 2021-12-18 DIAGNOSIS — Z01818 Encounter for other preprocedural examination: Secondary | ICD-10-CM | POA: Insufficient documentation

## 2021-12-18 LAB — URINALYSIS, ROUTINE W REFLEX MICROSCOPIC
Bilirubin Urine: NEGATIVE
Glucose, UA: NEGATIVE mg/dL
Hgb urine dipstick: NEGATIVE
Ketones, ur: NEGATIVE mg/dL
Leukocytes,Ua: NEGATIVE
Nitrite: NEGATIVE
Protein, ur: NEGATIVE mg/dL
Specific Gravity, Urine: 1.015 (ref 1.005–1.030)
pH: 6 (ref 5.0–8.0)

## 2021-12-18 LAB — COMPREHENSIVE METABOLIC PANEL
ALT: 27 U/L (ref 0–44)
AST: 25 U/L (ref 15–41)
Albumin: 4.3 g/dL (ref 3.5–5.0)
Alkaline Phosphatase: 51 U/L (ref 38–126)
Anion gap: 7 (ref 5–15)
BUN: 18 mg/dL (ref 8–23)
CO2: 27 mmol/L (ref 22–32)
Calcium: 9 mg/dL (ref 8.9–10.3)
Chloride: 102 mmol/L (ref 98–111)
Creatinine, Ser: 0.97 mg/dL (ref 0.61–1.24)
GFR, Estimated: >60 mL/min (ref >60)
Glucose, Bld: 108 mg/dL — ABNORMAL HIGH (ref 70–99)
Potassium: 3.8 mmol/L (ref 3.5–5.1)
Sodium: 136 mmol/L (ref 135–145)
Total Bilirubin: 1.6 mg/dL — ABNORMAL HIGH (ref 0.3–1.2)
Total Protein: 6.8 g/dL (ref 6.5–8.1)

## 2021-12-18 LAB — SURGICAL PCR SCREEN

## 2021-12-18 LAB — CBC
HCT: 42.4 % (ref 39.0–52.0)
Hemoglobin: 14.3 g/dL (ref 13.0–17.0)
MCH: 33.3 pg (ref 26.0–34.0)
MCHC: 33.7 g/dL (ref 30.0–36.0)
MCV: 98.8 fL (ref 80.0–100.0)
Platelets: 100 10*3/uL — ABNORMAL LOW (ref 150–400)
RBC: 4.29 MIL/uL (ref 4.22–5.81)
RDW: 11.9 % (ref 11.5–15.5)
WBC: 3.9 10*3/uL — ABNORMAL LOW (ref 4.0–10.5)
nRBC: 0 % (ref 0.0–0.2)

## 2021-12-18 LAB — PROTIME-INR
INR: 1.2 (ref 0.8–1.2)
Prothrombin Time: 15.4 seconds — ABNORMAL HIGH (ref 11.4–15.2)

## 2021-12-18 LAB — APTT: aPTT: 32 seconds (ref 24–36)

## 2021-12-18 NOTE — Progress Notes (Signed)
PCP - [name] MD ?Cardiologist - none ? ?PPM/ICD - denies ?Device Orders -  ?Rep Notified -  ? ?Chest x-ray - na ?EKG - 12/18/21 ?Stress Test - none ?ECHO - none ?Cardiac Cath - none ? ?Sleep Study - none ?CPAP -  ? ?Fasting Blood Sugar - na ?Checks Blood Sugar _____ times a day ? ?Blood Thinner Instructions:na ?Aspirin Instructions: Pt(through Interpreter) stated he was taking baby aspirin but had stopped it. I explained to pt and interpreter that pt needs to call Dr. Darc[facility] office to ask for instructions for stopping or continuing aspirin. Office phone number for Dr. Randie Heinz given to patient. Interpreter said pt's daughter speaks [place] and can call for patient.  ? ?ERAS Protcol -no ?PRE-SURGERY Ensure or G2-  ? ?COVID TEST-na  ? ? ?Anesthesia review:yes abnormal EKG  ? ?Patient denies shortness of breath, fever, cough and chest pain at PAT appointment ? ? ?All instructions explained to the patient, with a verbal understanding of the material. Patient agrees to go over the instructions while at home for a better understanding. Patient also instructed to self quarantine after being tested for COVID-19. The opportunity to ask questions was provided. ?  ?

## 2021-12-25 NOTE — Anesthesia Preprocedure Evaluation (Addendum)
Anesthesia Evaluation  ?Patient identified by MRN, date of birth, ID band ?Patient awake ? ? ? ?Reviewed: ?Allergy & Precautions, NPO status , Patient's Chart, lab work & pertinent test results ? ?Airway ?Mallampati: II ? ?TM Distance: >3 FB ?Neck ROM: Full ? ? ? Dental ? ?(+) Dental Advisory Given, Missing ?  ?Pulmonary ?neg pulmonary ROS,  ?  ?Pulmonary exam normal ?breath sounds clear to auscultation ? ? ? ? ? ? Cardiovascular ?hypertension, Pt. on medications ?Normal cardiovascular exam ?Rhythm:Regular Rate:Normal ? ? ?  ?Neuro/Psych ?negative neurological ROS ?   ? GI/Hepatic ?negative GI ROS, Neg liver ROS,   ?Endo/Other  ?negative endocrine ROS ? Renal/GU ?negative Renal ROS  ? ?  ?Musculoskeletal ?negative musculoskeletal ROS ?(+)  ? Abdominal ?  ?Peds ? Hematology ?negative hematology ROS ?(+)   ?Anesthesia Other Findings ? ? Reproductive/Obstetrics ? ?  ? ? ? ? ? ? ? ? ? ? ? ? ? ?  ?  ? ? ? ? ? ? ? ?Anesthesia Physical ?Anesthesia Plan ? ?ASA: 3 ? ?Anesthesia Plan: General  ? ?Post-op Pain Management: Minimal or no pain anticipated  ? ?Induction: Intravenous ? ?PONV Risk Score and Plan: 2 and Ondansetron, Dexamethasone, Treatment may vary due to age or medical condition and Midazolam ? ?Airway Management Planned: Oral ETT ? ?Additional Equipment: Arterial line ? ?Intra-op Plan:  ? ?Post-operative Plan: Extubation in OR ? ?Informed Consent: I have reviewed the patients History and Physical, chart, labs and discussed the procedure including the risks, benefits and alternatives for the proposed anesthesia with the patient or authorized representative who has indicated his/her understanding and acceptance.  ? ? ? ?Dental advisory given ? ?Plan Discussed with: CRNA ? ?Anesthesia Plan Comments: (2 x 18 or > piv)  ? ? ? ? ? ?Anesthesia Quick Evaluation ? ?

## 2021-12-26 ENCOUNTER — Encounter (HOSPITAL_COMMUNITY)
Admission: RE | Disposition: A | Discharge status: Rehabilitation Facility | Payer: Self-pay | Source: Home / Self Care | Attending: Vascular Surgery

## 2021-12-26 ENCOUNTER — Inpatient Hospital Stay (HOSPITAL_COMMUNITY): Payer: Medicare Other

## 2021-12-26 ENCOUNTER — Inpatient Hospital Stay (HOSPITAL_COMMUNITY): Payer: Medicare Other | Admitting: Physician Assistant

## 2021-12-26 ENCOUNTER — Inpatient Hospital Stay (HOSPITAL_COMMUNITY)
Admission: RE | Admit: 2021-12-26 | Discharge: 2021-12-31 | DRG: 269 | Disposition: A | Discharge status: Rehabilitation Facility | Payer: Medicare Other | Attending: Vascular Surgery | Admitting: Vascular Surgery

## 2021-12-26 ENCOUNTER — Inpatient Hospital Stay (HOSPITAL_COMMUNITY): Payer: Medicare Other | Admitting: Certified Registered Nurse Anesthetist

## 2021-12-26 ENCOUNTER — Other Ambulatory Visit: Payer: Self-pay

## 2021-12-26 ENCOUNTER — Encounter (HOSPITAL_COMMUNITY): Payer: Self-pay | Admitting: Vascular Surgery

## 2021-12-26 DIAGNOSIS — M47812 Spondylosis without myelopathy or radiculopathy, cervical region: Secondary | ICD-10-CM | POA: Diagnosis present

## 2021-12-26 DIAGNOSIS — Z8679 Personal history of other diseases of the circulatory system: Secondary | ICD-10-CM | POA: Diagnosis not present

## 2021-12-26 DIAGNOSIS — M50223 Other cervical disc displacement at C6-C7 level: Secondary | ICD-10-CM | POA: Diagnosis present

## 2021-12-26 DIAGNOSIS — N400 Enlarged prostate without lower urinary tract symptoms: Secondary | ICD-10-CM | POA: Diagnosis present

## 2021-12-26 DIAGNOSIS — G992 Myelopathy in diseases classified elsewhere: Secondary | ICD-10-CM | POA: Diagnosis present

## 2021-12-26 DIAGNOSIS — R791 Abnormal coagulation profile: Secondary | ICD-10-CM | POA: Diagnosis not present

## 2021-12-26 DIAGNOSIS — D696 Thrombocytopenia, unspecified: Secondary | ICD-10-CM | POA: Diagnosis present

## 2021-12-26 DIAGNOSIS — G959 Disease of spinal cord, unspecified: Secondary | ICD-10-CM | POA: Diagnosis not present

## 2021-12-26 DIAGNOSIS — Z79899 Other long term (current) drug therapy: Secondary | ICD-10-CM

## 2021-12-26 DIAGNOSIS — Z79890 Hormone replacement therapy (postmenopausal): Secondary | ICD-10-CM | POA: Diagnosis not present

## 2021-12-26 DIAGNOSIS — Z9889 Other specified postprocedural states: Secondary | ICD-10-CM

## 2021-12-26 DIAGNOSIS — N319 Neuromuscular dysfunction of bladder, unspecified: Secondary | ICD-10-CM | POA: Diagnosis not present

## 2021-12-26 DIAGNOSIS — I10 Essential (primary) hypertension: Secondary | ICD-10-CM

## 2021-12-26 DIAGNOSIS — D539 Nutritional anemia, unspecified: Secondary | ICD-10-CM | POA: Diagnosis not present

## 2021-12-26 DIAGNOSIS — E785 Hyperlipidemia, unspecified: Secondary | ICD-10-CM | POA: Diagnosis present

## 2021-12-26 DIAGNOSIS — I743 Embolism and thrombosis of arteries of the lower extremities: Secondary | ICD-10-CM | POA: Diagnosis present

## 2021-12-26 DIAGNOSIS — R609 Edema, unspecified: Secondary | ICD-10-CM | POA: Diagnosis not present

## 2021-12-26 DIAGNOSIS — G8222 Paraplegia, incomplete: Secondary | ICD-10-CM | POA: Diagnosis not present

## 2021-12-26 DIAGNOSIS — G8382 Anterior cord syndrome: Secondary | ICD-10-CM | POA: Diagnosis not present

## 2021-12-26 DIAGNOSIS — I714 Abdominal aortic aneurysm, without rupture, unspecified: Secondary | ICD-10-CM | POA: Diagnosis present

## 2021-12-26 DIAGNOSIS — I771 Stricture of artery: Secondary | ICD-10-CM | POA: Diagnosis not present

## 2021-12-26 DIAGNOSIS — D62 Acute posthemorrhagic anemia: Secondary | ICD-10-CM | POA: Diagnosis not present

## 2021-12-26 DIAGNOSIS — G4700 Insomnia, unspecified: Secondary | ICD-10-CM | POA: Diagnosis not present

## 2021-12-26 DIAGNOSIS — N3000 Acute cystitis without hematuria: Secondary | ICD-10-CM | POA: Diagnosis not present

## 2021-12-26 DIAGNOSIS — D649 Anemia, unspecified: Secondary | ICD-10-CM | POA: Diagnosis not present

## 2021-12-26 DIAGNOSIS — I745 Embolism and thrombosis of iliac artery: Secondary | ICD-10-CM | POA: Diagnosis present

## 2021-12-26 DIAGNOSIS — G9511 Acute infarction of spinal cord (embolic) (nonembolic): Secondary | ICD-10-CM | POA: Diagnosis not present

## 2021-12-26 DIAGNOSIS — R519 Headache, unspecified: Secondary | ICD-10-CM | POA: Diagnosis not present

## 2021-12-26 DIAGNOSIS — M4802 Spinal stenosis, cervical region: Secondary | ICD-10-CM | POA: Diagnosis present

## 2021-12-26 DIAGNOSIS — R29898 Other symptoms and signs involving the musculoskeletal system: Secondary | ICD-10-CM

## 2021-12-26 DIAGNOSIS — D72829 Elevated white blood cell count, unspecified: Secondary | ICD-10-CM | POA: Diagnosis not present

## 2021-12-26 DIAGNOSIS — R7989 Other specified abnormal findings of blood chemistry: Secondary | ICD-10-CM | POA: Diagnosis not present

## 2021-12-26 DIAGNOSIS — R238 Other skin changes: Secondary | ICD-10-CM | POA: Diagnosis not present

## 2021-12-26 HISTORY — PX: ABDOMINAL AORTIC ENDOVASCULAR STENT GRAFT: SHX5707

## 2021-12-26 HISTORY — DX: Hyperlipidemia, unspecified: E78.5

## 2021-12-26 HISTORY — DX: Benign prostatic hyperplasia without lower urinary tract symptoms: N40.0

## 2021-12-26 HISTORY — DX: Nocturia: R35.1

## 2021-12-26 HISTORY — PX: THROMBECTOMY FEMORAL ARTERY: SHX6406

## 2021-12-26 HISTORY — PX: PATCH ANGIOPLASTY: SHX6230

## 2021-12-26 HISTORY — PX: ENDARTERECTOMY POPLITEAL: SHX5806

## 2021-12-26 HISTORY — PX: ENDARTERECTOMY FEMORAL: SHX5804

## 2021-12-26 HISTORY — PX: ULTRASOUND GUIDANCE FOR VASCULAR ACCESS: SHX6516

## 2021-12-26 LAB — BASIC METABOLIC PANEL
Anion gap: 8 (ref 5–15)
BUN: 19 mg/dL (ref 8–23)
CO2: 20 mmol/L — ABNORMAL LOW (ref 22–32)
Calcium: 7.7 mg/dL — ABNORMAL LOW (ref 8.9–10.3)
Chloride: 109 mmol/L (ref 98–111)
Creatinine, Ser: 1.09 mg/dL (ref 0.61–1.24)
GFR, Estimated: >60 mL/min (ref >60)
Glucose, Bld: 180 mg/dL — ABNORMAL HIGH (ref 70–99)
Potassium: 4 mmol/L (ref 3.5–5.1)
Sodium: 137 mmol/L (ref 135–145)

## 2021-12-26 LAB — CBC
HCT: 26.3 % — ABNORMAL LOW (ref 39.0–52.0)
HCT: 26.4 % — ABNORMAL LOW (ref 39.0–52.0)
Hemoglobin: 9 g/dL — ABNORMAL LOW (ref 13.0–17.0)
Hemoglobin: 8.8 g/dL — ABNORMAL LOW (ref 13.0–17.0)
MCH: 34 pg (ref 26.0–34.0)
MCH: 33.1 pg (ref 26.0–34.0)
MCHC: 34.2 g/dL (ref 30.0–36.0)
MCHC: 33.3 g/dL (ref 30.0–36.0)
MCV: 99.2 fL (ref 80.0–100.0)
MCV: 99.2 fL (ref 80.0–100.0)
Platelets: 76 10*3/uL — ABNORMAL LOW (ref 150–400)
Platelets: 84 10*3/uL — ABNORMAL LOW (ref 150–400)
RBC: 2.65 MIL/uL — ABNORMAL LOW (ref 4.22–5.81)
RBC: 2.66 MIL/uL — ABNORMAL LOW (ref 4.22–5.81)
RDW: 12 % (ref 11.5–15.5)
RDW: 12.1 % (ref 11.5–15.5)
WBC: 8 10*3/uL (ref 4.0–10.5)
WBC: 14.6 10*3/uL — ABNORMAL HIGH (ref 4.0–10.5)
nRBC: 0 % (ref 0.0–0.2)
nRBC: 0 % (ref 0.0–0.2)

## 2021-12-26 LAB — APTT: aPTT: 56 seconds — ABNORMAL HIGH (ref 24–36)

## 2021-12-26 LAB — POCT I-STAT 7, (LYTES, BLD GAS, ICA,H+H)
Acid-Base Excess: 0 mmol/L (ref 0.0–2.0)
Acid-Base Excess: 0 mmol/L (ref 0.0–2.0)
Bicarbonate: 24.8 mmol/L (ref 20.0–28.0)
Bicarbonate: 24.8 mmol/L (ref 20.0–28.0)
Calcium, Ion: 1.13 mmol/L — ABNORMAL LOW (ref 1.15–1.40)
Calcium, Ion: 1.13 mmol/L — ABNORMAL LOW (ref 1.15–1.40)
HCT: 32 % — ABNORMAL LOW (ref 39.0–52.0)
HCT: 29 % — ABNORMAL LOW (ref 39.0–52.0)
Hemoglobin: 10.9 g/dL — ABNORMAL LOW (ref 13.0–17.0)
Hemoglobin: 9.9 g/dL — ABNORMAL LOW (ref 13.0–17.0)
O2 Saturation: 100 %
O2 Saturation: 100 %
Patient temperature: 35
Patient temperature: 36.5
Potassium: 4 mmol/L (ref 3.5–5.1)
Potassium: 4.1 mmol/L (ref 3.5–5.1)
Sodium: 138 mmol/L (ref 135–145)
Sodium: 138 mmol/L (ref 135–145)
TCO2: 26 mmol/L (ref 22–32)
TCO2: 26 mmol/L (ref 22–32)
pCO2 arterial: 36.3 mmHg (ref 32–48)
pCO2 arterial: 39.7 mmHg (ref 32–48)
pH, Arterial: 7.434 (ref 7.35–7.45)
pH, Arterial: 7.401 (ref 7.35–7.45)
pO2, Arterial: 268 mmHg — ABNORMAL HIGH (ref 83–108)
pO2, Arterial: 212 mmHg — ABNORMAL HIGH (ref 83–108)

## 2021-12-26 LAB — SURGICAL PCR SCREEN
MRSA, PCR: NEGATIVE
Staphylococcus aureus: NEGATIVE

## 2021-12-26 LAB — PROTIME-INR
INR: 2.1 — ABNORMAL HIGH (ref 0.8–1.2)
INR: 1.8 — ABNORMAL HIGH (ref 0.8–1.2)
Prothrombin Time: 23.7 seconds — ABNORMAL HIGH (ref 11.4–15.2)
Prothrombin Time: 20.5 seconds — ABNORMAL HIGH (ref 11.4–15.2)

## 2021-12-26 LAB — PREPARE RBC (CROSSMATCH)

## 2021-12-26 LAB — ABO/RH: ABO/RH(D): AB POS

## 2021-12-26 LAB — POCT ACTIVATED CLOTTING TIME
Activated Clotting Time: 185 seconds
Activated Clotting Time: 479 seconds
Activated Clotting Time: 588 seconds
Activated Clotting Time: 143 seconds

## 2021-12-26 LAB — MAGNESIUM: Magnesium: 1.7 mg/dL (ref 1.7–2.4)

## 2021-12-26 IMAGING — DX DG ABD PORTABLE 1V
1 series · 1 of 1 positions shown · non-contrast
Comparison: None.

CLINICAL DATA: Status post abdominal aortic aneurysm repair.

EXAM:
PORTABLE ABDOMEN - 1 VIEW

[abdomen]
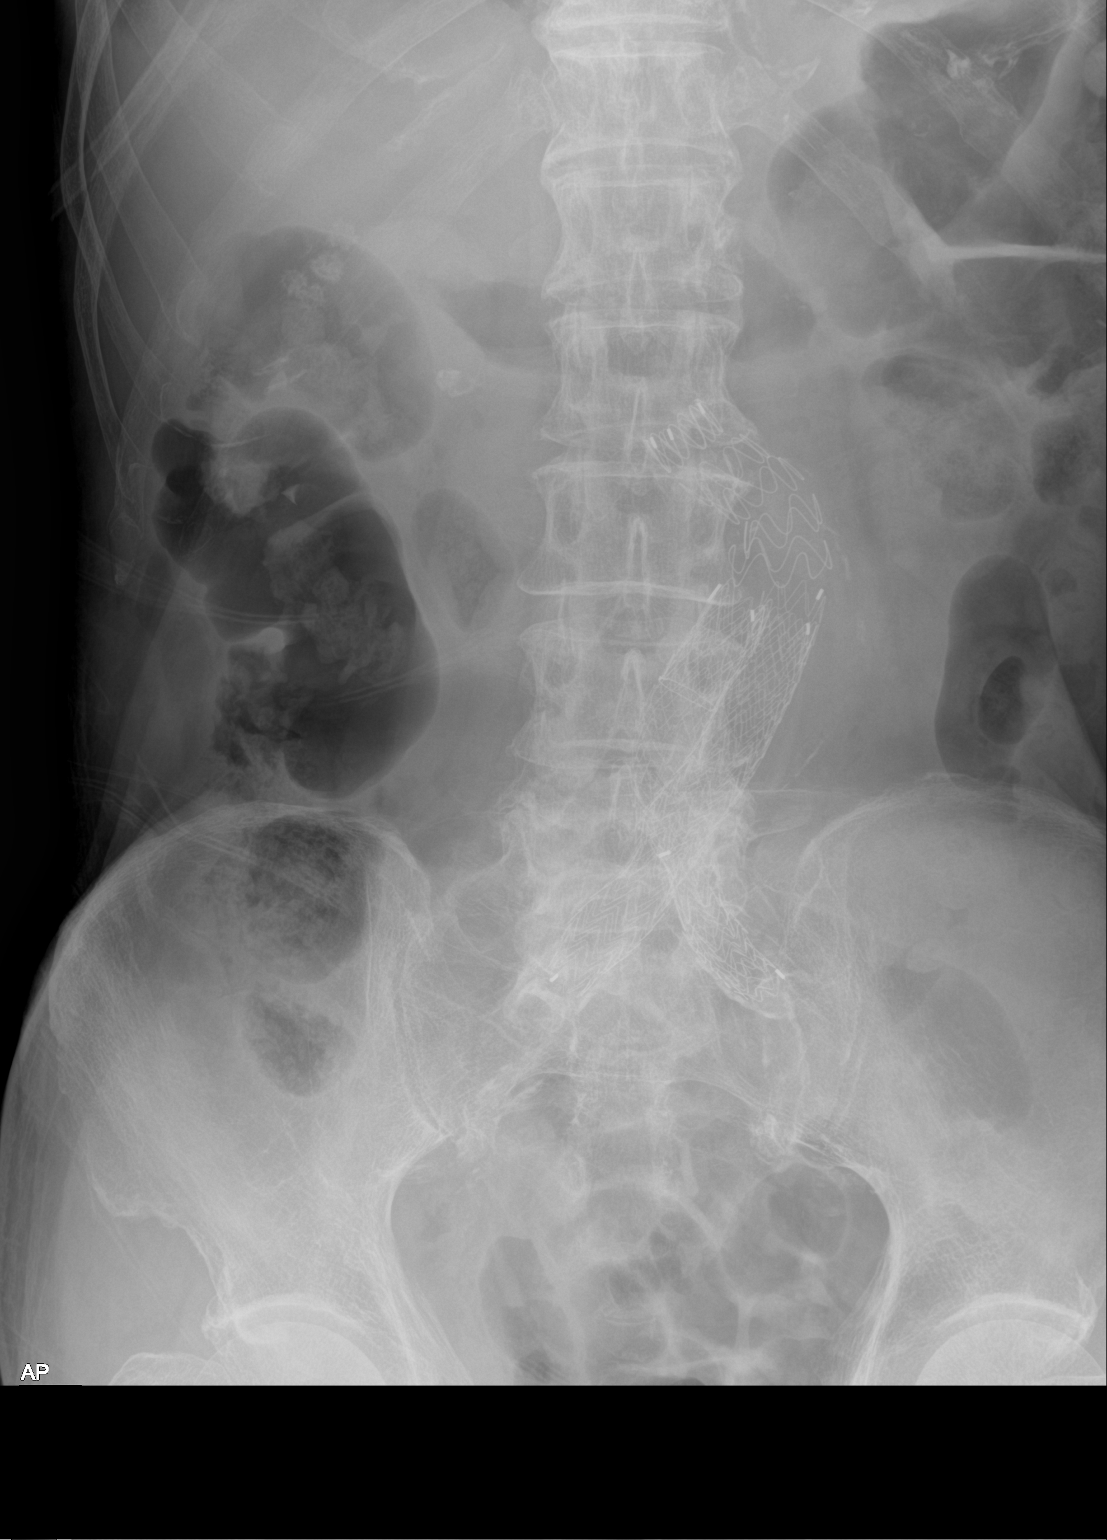

[1 of 1 positions shown; findings below may reference images not displayed]

FINDINGS: The bowel gas pattern is normal. Status post stent graft repair of
abdominal aorta. Graft appears to be in grossly good position.
IMPRESSION: Status post stent graft repair of abdominal aorta. No abnormal bowel
dilatation is noted.

## 2021-12-26 IMAGING — MR MR THORACIC SPINE W/O CM
4 of 7 series · 21 of 48 positions shown · non-contrast
Comparison: CTA abdomen pelvis [DATE]

CLINICAL DATA: Anterior cord syndrome.

EXAM:
MRI THORACIC AND LUMBAR SPINE WITHOUT CONTRAST
TECHNIQUE: Multiplanar and multiecho pulse sequences of the thoracic and lumbar
spine were obtained without intravenous contrast.

[Series 18: T1 · sagittal · 3.3mm · 0.62mm/px · 3 of 8 slices shown (1 of 2)]
[im 1/8]
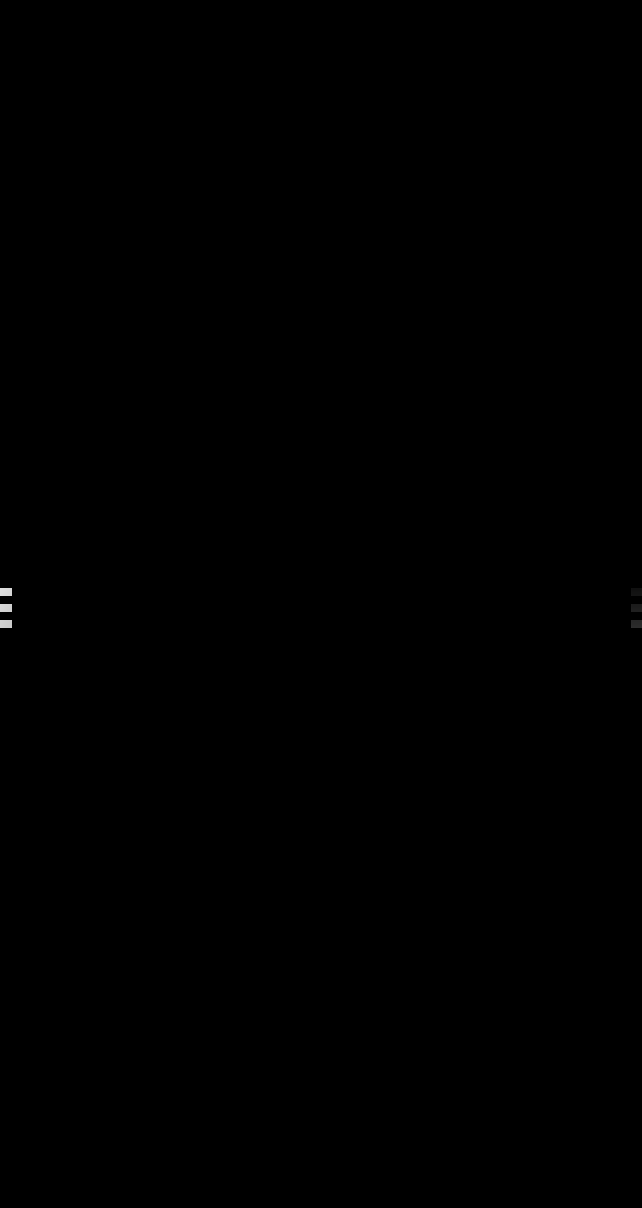
[im 4/8]
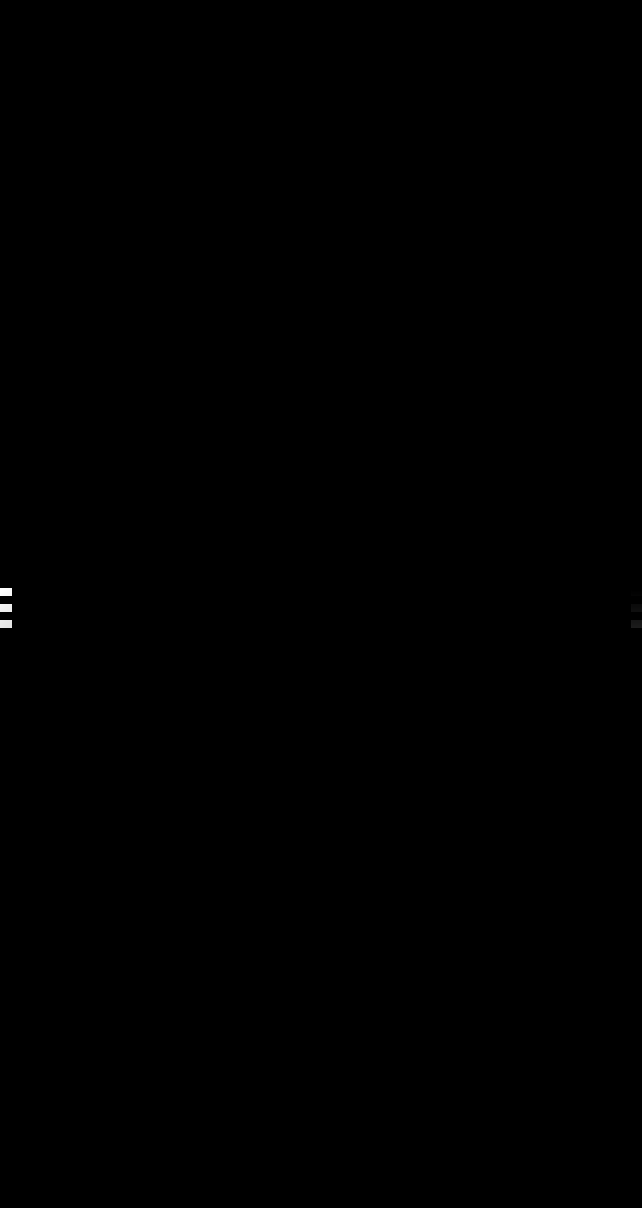
[im 8/8]
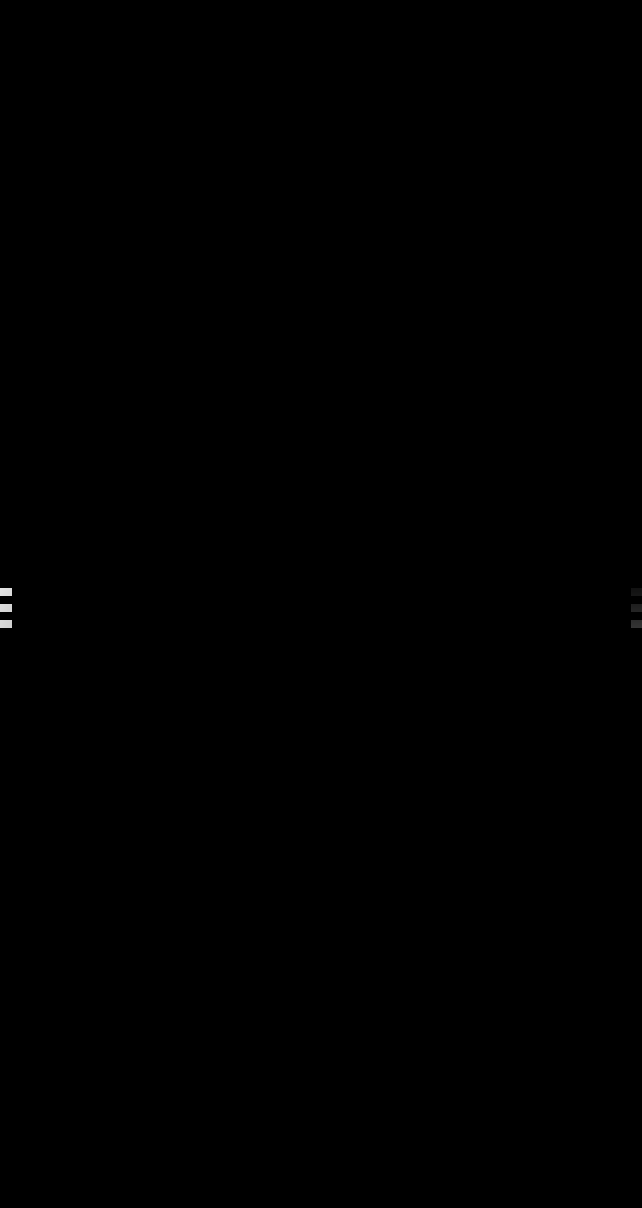

[Series 19: T2 · sagittal · 3.0mm · 0.76mm/px · 6 of 20 slices shown (1 of 2)]
[im 1/20]
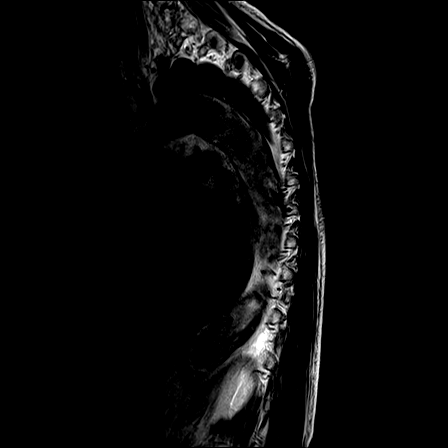
[im 4/20]
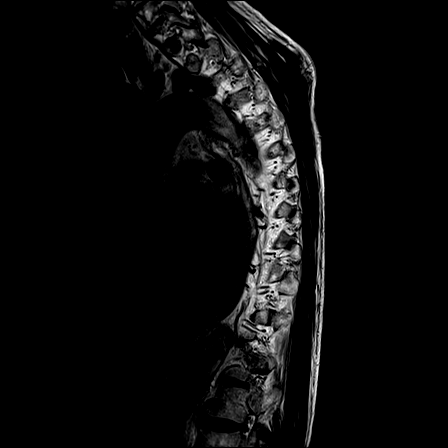
[im 8/20]
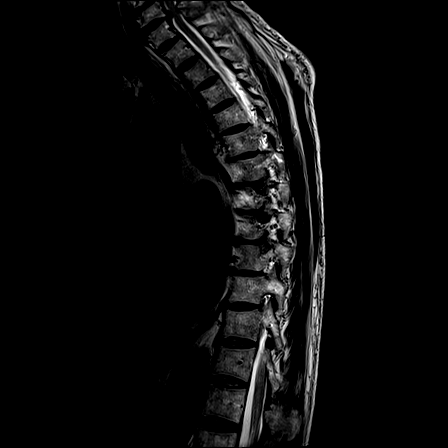
[im 12/20]
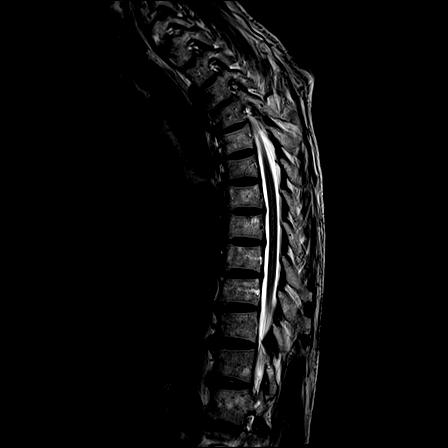
[im 16/20]
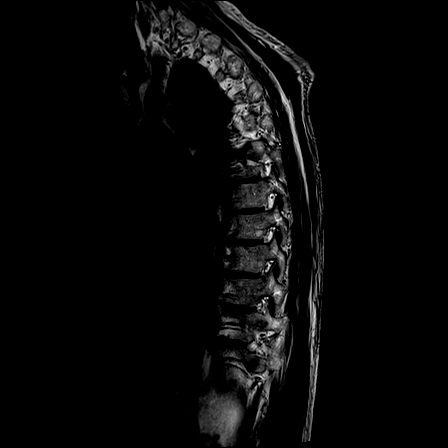
[im 20/20]
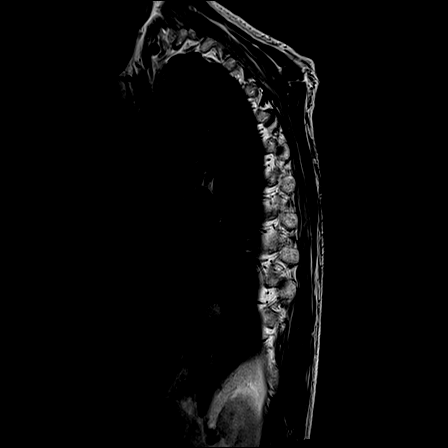

[Series 20: T1 · sagittal · 3.0mm · 0.76mm/px · 3 of 20 slices shown (2 of 2)]
[im 4/20]
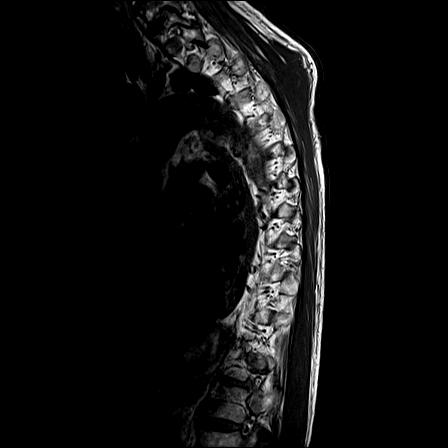
[im 12/20]
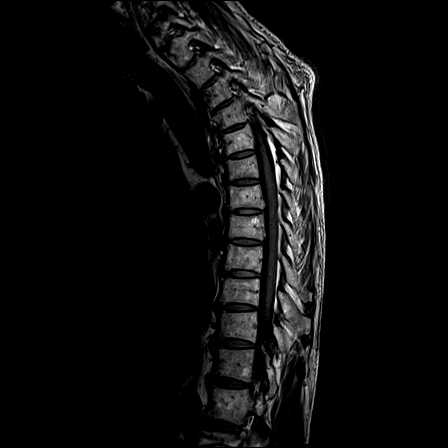
[im 20/20]
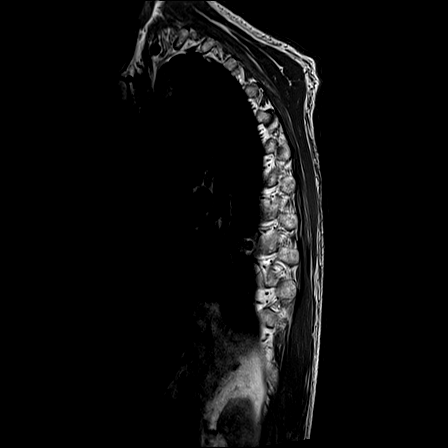

[Series 25: T2 · axial · 5.0mm · 0.59mm/px · z∈[-272,-66]mm · 9 of 39 slices shown (2 of 2)]
[im 1/39]
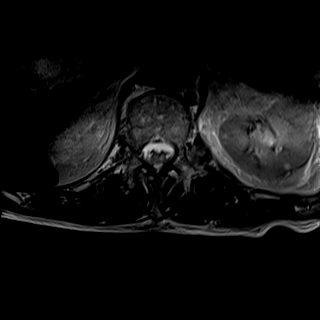
[im 7/39]
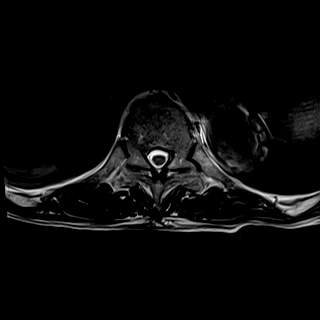
[im 11/39]
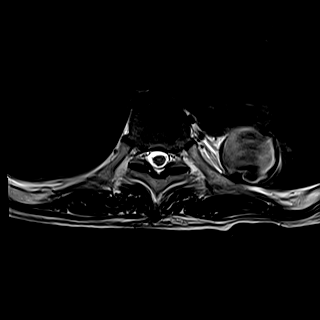
[im 18/39]
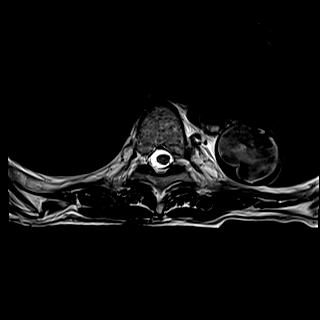
[im 21/39]
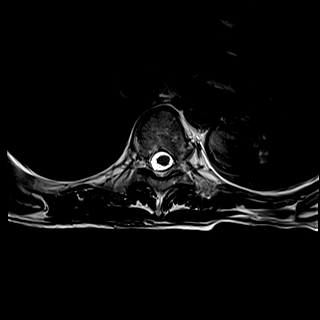
[im 28/39]
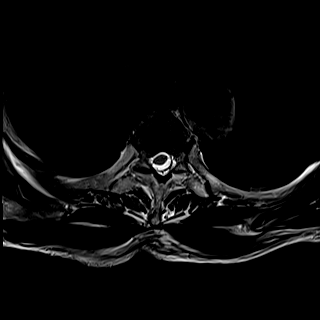
[im 32/39]
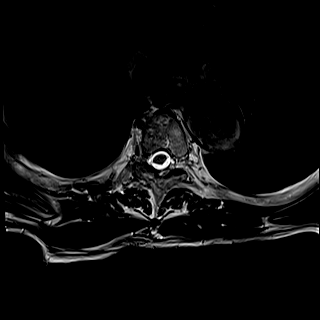
[im 35/39]
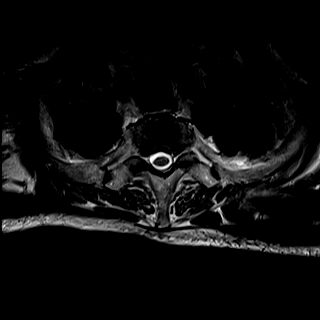
[im 39/39]
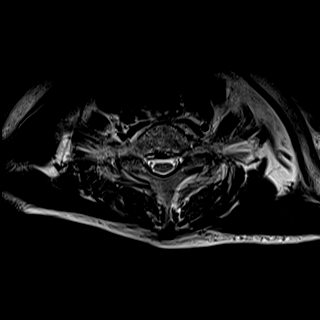

[21 of 48 positions shown; findings below may reference images not displayed]

FINDINGS: MRI THORACIC SPINE FINDINGS

Alignment:  Physiologic.

Vertebrae: No fracture, evidence of discitis, or bone lesion.

Cord:  Normal signal and morphology.

Paraspinal and other soft tissues: Negative.

Disc levels:

There is severe spinal canal stenosis at C6-7 with mild mass effect
on the spinal cord. There is no thoracic spinal canal stenosis.

MRI LUMBAR SPINE FINDINGS

Segmentation: Standard numbering. There is a rudimentary disc space
at S1-2.

Alignment:  Grade 1 anterolisthesis at L5-S1

Vertebrae:  No fracture, evidence of discitis, or bone lesion.

Conus medullaris and cauda equina: Conus extends to the L1 level.
Conus and cauda equina appear normal.

Paraspinal and other soft tissues: There is a incompletely
visualized abdominal aortic aneurysm that measures approximately 6
cm in transverse dimension.

Disc levels:

L1-L2: Normal disc space and facet joints. No spinal canal stenosis.
No neural foraminal stenosis.

L2-L3: Normal disc space and facet joints. No spinal canal stenosis.
No neural foraminal stenosis.

L3-L4: Normal disc space and facet joints. No spinal canal stenosis.
No neural foraminal stenosis.

L4-L5: Mild disc bulge and moderate facet hypertrophy. Narrowing of
both lateral recesses without central spinal canal stenosis. Mild
bilateral neural foraminal stenosis.

L5-S1: Medium-sized disc bulge and severe facet hypertrophy. Mild
spinal canal stenosis. Moderate right and severe left neural
foraminal stenosis.

Visualized sacrum: Normal.
IMPRESSION: 1. Severe spinal canal stenosis at C6-7 with mild mass effect on the
spinal cord. Dedicated MRI of the cervical spine is recommended for
further assessment.
2. Grade 1 anterolisthesis at L5-S1 with mild spinal canal stenosis,
moderate right and severe left neural foraminal stenosis.
3. Narrowing of both lateral recesses at L4-5 with mild bilateral
neural foraminal stenosis.
4. Incompletely visualized abdominal aortic aneurysm that measures
approximately 6 cm in transverse dimension. The visualized portion
is unchanged since [DATE].
Recommend referral to a vascular specialist, if not already
obtained.
Reference: [HOSPITAL] [4H];[DATE].

## 2021-12-26 SURGERY — INSERTION, ENDOVASCULAR STENT GRAFT, AORTA, ABDOMINAL
Anesthesia: General | Site: Leg Lower | Laterality: Right

## 2021-12-26 MED ORDER — ESMOLOL HCL 100 MG/10ML IV SOLN
INTRAVENOUS | Status: AC
  Filled 2021-12-26: qty 10

## 2021-12-26 MED ORDER — HEPARIN SODIUM (PORCINE) 1000 UNIT/ML IJ SOLN
INTRAMUSCULAR | Status: AC
  Filled 2021-12-26: qty 30

## 2021-12-26 MED ORDER — CEFAZOLIN SODIUM-DEXTROSE 2-4 GM/100ML-% IV SOLN
2.0000 g | Freq: Three times a day (TID) | INTRAVENOUS | Status: AC
  Administered 2021-12-26 (×3): 2 g via INTRAVENOUS
  Filled 2021-12-26 (×2): qty 100

## 2021-12-26 MED ORDER — SODIUM CHLORIDE 0.9 % IV SOLN: INTRAVENOUS | Status: DC

## 2021-12-26 MED ORDER — IODIXANOL 320 MG/ML IV SOLN
INTRAVENOUS | Status: DC | PRN
  Administered 2021-12-26: 130 mL via INTRA_ARTERIAL

## 2021-12-26 MED ORDER — PHENYLEPHRINE 80 MCG/ML (10ML) SYRINGE FOR IV PUSH (FOR BLOOD PRESSURE SUPPORT)
PREFILLED_SYRINGE | INTRAVENOUS | Status: AC
  Filled 2021-12-26: qty 10

## 2021-12-26 MED ORDER — GUAIFENESIN-DM 100-10 MG/5ML PO SYRP: 15.0000 mL | ORAL_SOLUTION | ORAL | Status: DC | PRN

## 2021-12-26 MED ORDER — CHLORHEXIDINE GLUCONATE 0.12 % MT SOLN
15.0000 mL | Freq: Once | OROMUCOSAL | Status: AC
  Administered 2021-12-26: 15 mL via OROMUCOSAL
  Filled 2021-12-26: qty 15

## 2021-12-26 MED ORDER — HYDROMORPHONE HCL 1 MG/ML IJ SOLN
0.2500 mg | INTRAMUSCULAR | Status: DC | PRN
  Administered 2021-12-26 (×2): 0.5 mg via INTRAVENOUS

## 2021-12-26 MED ORDER — HEPARIN SODIUM (PORCINE) 5000 UNIT/ML IJ SOLN
5000.0000 [IU] | Freq: Three times a day (TID) | INTRAMUSCULAR | Status: DC
  Administered 2021-12-27: 5000 [IU] via SUBCUTANEOUS
  Filled 2021-12-26 (×3): qty 1

## 2021-12-26 MED ORDER — CLEVIDIPINE BUTYRATE 0.5 MG/ML IV EMUL
INTRAVENOUS | Status: DC | PRN
  Administered 2021-12-26: 2 mg/h via INTRAVENOUS

## 2021-12-26 MED ORDER — SODIUM CHLORIDE 0.9 % IV SOLN: 500.0000 mL | Freq: Once | INTRAVENOUS | Status: DC | PRN

## 2021-12-26 MED ORDER — HEPARIN 6000 UNIT IRRIGATION SOLUTION
Status: AC
  Filled 2021-12-26: qty 500

## 2021-12-26 MED ORDER — EPHEDRINE SULFATE-NACL 50-0.9 MG/10ML-% IV SOSY
PREFILLED_SYRINGE | INTRAVENOUS | Status: DC | PRN
  Administered 2021-12-26 (×4): 5 mg via INTRAVENOUS

## 2021-12-26 MED ORDER — SODIUM CHLORIDE 0.9 % IV SOLN
500.0000 mL | Freq: Once | INTRAVENOUS | Status: DC | PRN
Start: 2021-12-26 — End: 2021-12-31

## 2021-12-26 MED ORDER — HYDRALAZINE HCL 20 MG/ML IJ SOLN: 5.0000 mg | INTRAMUSCULAR | Status: DC | PRN

## 2021-12-26 MED ORDER — LABETALOL HCL 5 MG/ML IV SOLN: 10.0000 mg | INTRAVENOUS | Status: DC | PRN

## 2021-12-26 MED ORDER — LACTATED RINGERS IV SOLN: INTRAVENOUS | Status: DC | PRN

## 2021-12-26 MED ORDER — FENTANYL CITRATE (PF) 250 MCG/5ML IJ SOLN
INTRAMUSCULAR | Status: AC
Start: 2021-12-26 — End: ?
  Filled 2021-12-26: qty 5

## 2021-12-26 MED ORDER — MORPHINE SULFATE (PF) 2 MG/ML IV SOLN
2.0000 mg | INTRAVENOUS | Status: DC | PRN
  Administered 2021-12-26: 2 mg via INTRAVENOUS
  Filled 2021-12-26 (×2): qty 1

## 2021-12-26 MED ORDER — PROPOFOL 10 MG/ML IV BOLUS
INTRAVENOUS | Status: AC
  Filled 2021-12-26: qty 20

## 2021-12-26 MED ORDER — HEPARIN 6000 UNIT IRRIGATION SOLUTION
Status: DC | PRN
  Administered 2021-12-26 (×2): 1

## 2021-12-26 MED ORDER — LACTATED RINGERS IV SOLN: INTRAVENOUS | Status: DC

## 2021-12-26 MED ORDER — PHENYLEPHRINE 80 MCG/ML (10ML) SYRINGE FOR IV PUSH (FOR BLOOD PRESSURE SUPPORT)
PREFILLED_SYRINGE | INTRAVENOUS | Status: DC | PRN
  Administered 2021-12-26 (×3): 80 ug via INTRAVENOUS
  Administered 2021-12-26: 120 ug via INTRAVENOUS

## 2021-12-26 MED ORDER — POTASSIUM CHLORIDE CRYS ER 20 MEQ PO TBCR: 20.0000 meq | EXTENDED_RELEASE_TABLET | Freq: Every day | ORAL | Status: DC | PRN

## 2021-12-26 MED ORDER — DOCUSATE SODIUM 100 MG PO CAPS
100.0000 mg | ORAL_CAPSULE | Freq: Every day | ORAL | Status: DC
  Administered 2021-12-27 – 2021-12-30 (×4): 100 mg via ORAL
  Filled 2021-12-26 (×4): qty 1

## 2021-12-26 MED ORDER — CHLORHEXIDINE GLUCONATE CLOTH 2 % EX PADS
6.0000 | MEDICATED_PAD | Freq: Once | CUTANEOUS | Status: DC
  Administered 2021-12-26: 6 via TOPICAL

## 2021-12-26 MED ORDER — ASPIRIN EC 81 MG PO TBEC
81.0000 mg | DELAYED_RELEASE_TABLET | Freq: Every day | ORAL | Status: DC
  Administered 2021-12-27 – 2021-12-28 (×2): 81 mg via ORAL
  Filled 2021-12-26 (×2): qty 1

## 2021-12-26 MED ORDER — ORAL CARE MOUTH RINSE: 15.0000 mL | Freq: Once | OROMUCOSAL | Status: AC

## 2021-12-26 MED ORDER — ONDANSETRON HCL 4 MG/2ML IJ SOLN
INTRAMUSCULAR | Status: DC | PRN
  Administered 2021-12-26 (×2): 4 mg via INTRAVENOUS

## 2021-12-26 MED ORDER — ACETAMINOPHEN 325 MG PO TABS
325.0000 mg | ORAL_TABLET | ORAL | Status: DC | PRN
  Administered 2021-12-28 – 2021-12-30 (×2): 650 mg via ORAL
  Filled 2021-12-26 (×2): qty 2

## 2021-12-26 MED ORDER — SODIUM CHLORIDE (PF) 0.9 % IJ SOLN
INTRAMUSCULAR | Status: AC
  Filled 2021-12-26: qty 10

## 2021-12-26 MED ORDER — SENNOSIDES-DOCUSATE SODIUM 8.6-50 MG PO TABS: 1.0000 | ORAL_TABLET | Freq: Every evening | ORAL | Status: DC | PRN

## 2021-12-26 MED ORDER — MUPIROCIN 2 % EX OINT
1.0000 "application " | TOPICAL_OINTMENT | Freq: Two times a day (BID) | CUTANEOUS | Status: AC
  Administered 2021-12-26 – 2021-12-30 (×10): 1 via NASAL
  Filled 2021-12-26 (×5): qty 22

## 2021-12-26 MED ORDER — ONDANSETRON HCL 4 MG/2ML IJ SOLN
INTRAMUSCULAR | Status: AC
  Filled 2021-12-26: qty 2

## 2021-12-26 MED ORDER — ENALAPRIL MALEATE 5 MG PO TABS
20.0000 mg | ORAL_TABLET | Freq: Every day | ORAL | Status: DC
Start: 2021-12-26 — End: 2021-12-26
  Filled 2021-12-26: qty 1

## 2021-12-26 MED ORDER — HEPARIN SODIUM (PORCINE) 1000 UNIT/ML IJ SOLN
INTRAMUSCULAR | Status: DC | PRN
  Administered 2021-12-26 (×2): 3000 [IU] via INTRAVENOUS
  Administered 2021-12-26: 5000 [IU] via INTRAVENOUS
  Administered 2021-12-26: 3000 [IU] via INTRAVENOUS
  Administered 2021-12-26: 5000 [IU] via INTRAVENOUS
  Administered 2021-12-26: 3000 [IU] via INTRAVENOUS

## 2021-12-26 MED ORDER — METOPROLOL TARTRATE 5 MG/5ML IV SOLN: 2.0000 mg | INTRAVENOUS | Status: DC | PRN

## 2021-12-26 MED ORDER — EPHEDRINE 5 MG/ML INJ
INTRAVENOUS | Status: AC
  Filled 2021-12-26: qty 5

## 2021-12-26 MED ORDER — PHENYLEPHRINE HCL-NACL 20-0.9 MG/250ML-% IV SOLN
INTRAVENOUS | Status: DC | PRN
  Administered 2021-12-26: 10 ug/min via INTRAVENOUS

## 2021-12-26 MED ORDER — HYDROMORPHONE HCL 1 MG/ML IJ SOLN
INTRAMUSCULAR | Status: AC
  Filled 2021-12-26: qty 1

## 2021-12-26 MED ORDER — DEXAMETHASONE SODIUM PHOSPHATE 10 MG/ML IJ SOLN
INTRAMUSCULAR | Status: DC | PRN
  Administered 2021-12-26: 4 mg via INTRAVENOUS

## 2021-12-26 MED ORDER — LIDOCAINE 2% (20 MG/ML) 5 ML SYRINGE
INTRAMUSCULAR | Status: DC | PRN
  Administered 2021-12-26: 60 mg via INTRAVENOUS

## 2021-12-26 MED ORDER — ALUM & MAG HYDROXIDE-SIMETH 200-200-20 MG/5ML PO SUSP: 15.0000 mL | ORAL | Status: DC | PRN

## 2021-12-26 MED ORDER — ROCURONIUM BROMIDE 10 MG/ML (PF) SYRINGE
PREFILLED_SYRINGE | INTRAVENOUS | Status: DC | PRN
  Administered 2021-12-26: 50 mg via INTRAVENOUS

## 2021-12-26 MED ORDER — 0.9 % SODIUM CHLORIDE (POUR BTL) OPTIME
TOPICAL | Status: DC | PRN
Start: 2021-12-26 — End: 2021-12-26
  Administered 2021-12-26: 1000 mL

## 2021-12-26 MED ORDER — ALBUMIN HUMAN 5 % IV SOLN: INTRAVENOUS | Status: DC | PRN

## 2021-12-26 MED ORDER — SODIUM CHLORIDE 0.9 % IV SOLN
250.0000 mL | INTRAVENOUS | Status: DC
  Administered 2021-12-26: 250 mL via INTRAVENOUS

## 2021-12-26 MED ORDER — TAMSULOSIN HCL 0.4 MG PO CAPS
0.8000 mg | ORAL_CAPSULE | Freq: Every day | ORAL | Status: DC
  Administered 2021-12-26: 0.8 mg via ORAL
  Filled 2021-12-26: qty 2

## 2021-12-26 MED ORDER — OXYCODONE-ACETAMINOPHEN 5-325 MG PO TABS
1.0000 | ORAL_TABLET | ORAL | Status: DC | PRN
  Administered 2021-12-26: 2 via ORAL
  Administered 2021-12-27: 1 via ORAL
  Filled 2021-12-26: qty 1
  Filled 2021-12-26: qty 2

## 2021-12-26 MED ORDER — CHLORHEXIDINE GLUCONATE CLOTH 2 % EX PADS
6.0000 | MEDICATED_PAD | Freq: Every day | CUTANEOUS | Status: DC
  Administered 2021-12-26 – 2021-12-28 (×2): 6 via TOPICAL

## 2021-12-26 MED ORDER — AMISULPRIDE (ANTIEMETIC) 5 MG/2ML IV SOLN: 10.0000 mg | Freq: Once | INTRAVENOUS | Status: DC | PRN

## 2021-12-26 MED ORDER — ONDANSETRON HCL 4 MG/2ML IJ SOLN
4.0000 mg | Freq: Four times a day (QID) | INTRAMUSCULAR | Status: DC | PRN
  Administered 2021-12-26: 4 mg via INTRAVENOUS
  Filled 2021-12-26: qty 2

## 2021-12-26 MED ORDER — HEMOSTATIC AGENTS (NO CHARGE) OPTIME
TOPICAL | Status: DC | PRN
  Administered 2021-12-26 (×2): 1 via TOPICAL

## 2021-12-26 MED ORDER — TRAZODONE HCL 50 MG PO TABS
50.0000 mg | ORAL_TABLET | Freq: Every evening | ORAL | Status: DC | PRN
  Administered 2021-12-27 – 2021-12-30 (×5): 50 mg via ORAL
  Filled 2021-12-26 (×6): qty 1

## 2021-12-26 MED ORDER — ESMOLOL HCL 100 MG/10ML IV SOLN
INTRAVENOUS | Status: DC | PRN
  Administered 2021-12-26: 20 mg via INTRAVENOUS

## 2021-12-26 MED ORDER — CEFAZOLIN SODIUM-DEXTROSE 2-4 GM/100ML-% IV SOLN
2.0000 g | INTRAVENOUS | Status: AC
  Administered 2021-12-26 (×2): 2 g via INTRAVENOUS
  Filled 2021-12-26: qty 100

## 2021-12-26 MED ORDER — PHENOL 1.4 % MT LIQD: 1.0000 | OROMUCOSAL | Status: DC | PRN

## 2021-12-26 MED ORDER — ACETAMINOPHEN 650 MG RE SUPP: 325.0000 mg | RECTAL | Status: DC | PRN

## 2021-12-26 MED ORDER — BISACODYL 5 MG PO TBEC: 5.0000 mg | DELAYED_RELEASE_TABLET | Freq: Every day | ORAL | Status: DC | PRN

## 2021-12-26 MED ORDER — FLEET ENEMA 7-19 GM/118ML RE ENEM
1.0000 | ENEMA | Freq: Once | RECTAL | Status: DC | PRN
  Filled 2021-12-26: qty 1

## 2021-12-26 MED ORDER — FENTANYL CITRATE (PF) 250 MCG/5ML IJ SOLN
INTRAMUSCULAR | Status: DC | PRN
  Administered 2021-12-26: 100 ug via INTRAVENOUS
  Administered 2021-12-26 (×3): 50 ug via INTRAVENOUS

## 2021-12-26 MED ORDER — PROPOFOL 10 MG/ML IV BOLUS
INTRAVENOUS | Status: DC | PRN
  Administered 2021-12-26: 100 mg via INTRAVENOUS
  Administered 2021-12-26: 40 mg via INTRAVENOUS

## 2021-12-26 MED ORDER — NOREPINEPHRINE 4 MG/250ML-% IV SOLN
2.0000 ug/min | INTRAVENOUS | Status: DC
  Administered 2021-12-26: 2 ug/min via INTRAVENOUS
  Filled 2021-12-26: qty 250

## 2021-12-26 MED ORDER — SUGAMMADEX SODIUM 200 MG/2ML IV SOLN
INTRAVENOUS | Status: DC | PRN
  Administered 2021-12-26: 125 mg via INTRAVENOUS

## 2021-12-26 MED ORDER — MAGNESIUM SULFATE 2 GM/50ML IV SOLN: 2.0000 g | Freq: Every day | INTRAVENOUS | Status: DC | PRN

## 2021-12-26 MED ORDER — CEFAZOLIN SODIUM 1 G IJ SOLR
INTRAMUSCULAR | Status: AC
  Filled 2021-12-26: qty 20

## 2021-12-26 MED ORDER — SIMVASTATIN 20 MG PO TABS
20.0000 mg | ORAL_TABLET | Freq: Every day | ORAL | Status: DC
  Administered 2021-12-26 – 2021-12-30 (×5): 20 mg via ORAL
  Filled 2021-12-26 (×5): qty 1

## 2021-12-26 MED ORDER — PROTAMINE SULFATE 10 MG/ML IV SOLN
INTRAVENOUS | Status: AC
  Filled 2021-12-26: qty 5

## 2021-12-26 MED ORDER — DEXAMETHASONE SODIUM PHOSPHATE 10 MG/ML IJ SOLN
INTRAMUSCULAR | Status: AC
  Filled 2021-12-26: qty 1

## 2021-12-26 MED ORDER — PANTOPRAZOLE SODIUM 40 MG PO TBEC
40.0000 mg | DELAYED_RELEASE_TABLET | Freq: Every day | ORAL | Status: DC
  Administered 2021-12-27 – 2021-12-31 (×5): 40 mg via ORAL
  Filled 2021-12-26 (×5): qty 1

## 2021-12-26 MED ORDER — PROTAMINE SULFATE 10 MG/ML IV SOLN
INTRAVENOUS | Status: DC | PRN
  Administered 2021-12-26: 50 mg via INTRAVENOUS

## 2021-12-26 SURGICAL SUPPLY — 90 items
ADH SKN CLS APL DERMABOND .7 (GAUZE/BANDAGES/DRESSINGS) ×12
BAG COUNTER SPONGE SURGICOUNT (BAG) ×5 IMPLANT
BAG DECANTER FOR FLEXI CONT (MISCELLANEOUS) ×1 IMPLANT
BAG ISL DRAPE 18X18 STRL (DRAPES) ×4
BAG ISOLATION DRAPE 18X18 (DRAPES) IMPLANT
BAG SPNG CNTER NS LX DISP (BAG) ×4
BLADE CLIPPER SURG (BLADE) ×5 IMPLANT
BLADE SURG 10 STRL SS (BLADE) ×1 IMPLANT
CANISTER SUCT 3000ML PPV (MISCELLANEOUS) ×5 IMPLANT
CATH BEACON 5.038 65CM KMP-01 (CATHETERS) ×5 IMPLANT
CATH EMB 3FR 40CM (CATHETERS) ×1 IMPLANT
CATH EMB 3FR 80CM (CATHETERS) ×4 IMPLANT
CATH OMNI FLUSH .035X70CM (CATHETERS) ×5 IMPLANT
CLIP LIGATING EXTRA MED SLVR (CLIP) ×1 IMPLANT
CLIP LIGATING EXTRA SM BLUE (MISCELLANEOUS) ×1 IMPLANT
CLOSURE PERCLOSE PROSTYLE (VASCULAR PRODUCTS) ×4 IMPLANT
COVER PROBE W GEL 5X96 (DRAPES) ×2 IMPLANT
DERMABOND ADVANCED (GAUZE/BANDAGES/DRESSINGS) ×3
DERMABOND ADVANCED .7 DNX12 (GAUZE/BANDAGES/DRESSINGS) ×4 IMPLANT
DEVICE CLOSURE PERCLS PRGLD 6F (VASCULAR PRODUCTS) ×16 IMPLANT
DRAPE INCISE IOBAN 66X45 STRL (DRAPES) ×1 IMPLANT
DRAPE ISOLATION BAG 18X18 (DRAPES) ×1
DRAPE ORTHO SPLIT 77X108 STRL (DRAPES) ×5
DRAPE SURG ORHT 6 SPLT 77X108 (DRAPES) IMPLANT
DRSG TEGADERM 4X10 (GAUZE/BANDAGES/DRESSINGS) ×1 IMPLANT
DRYSEAL FLEXSHEATH 12FR 33CM (SHEATH) ×1
DRYSEAL FLEXSHEATH 16FR 33CM (SHEATH) ×1
ELECT CAUTERY BLADE 6.4 (BLADE) ×1 IMPLANT
ELECT REM PT RETURN 9FT ADLT (ELECTROSURGICAL) ×10
ELECTRODE REM PT RTRN 9FT ADLT (ELECTROSURGICAL) ×8 IMPLANT
EXCLDR TRNK ENDO 23X14.5X12 15 (Endovascular Graft) ×5 IMPLANT
EXCLUDER TNK END 23X14.5X12 15 (Endovascular Graft) IMPLANT
GLOVE BIO SURGEON STRL SZ7.5 (GLOVE) ×5 IMPLANT
GOWN STRL REUS W/ TWL LRG LVL3 (GOWN DISPOSABLE) ×8 IMPLANT
GOWN STRL REUS W/ TWL XL LVL3 (GOWN DISPOSABLE) ×8 IMPLANT
GOWN STRL REUS W/TWL LRG LVL3 (GOWN DISPOSABLE) ×10
GOWN STRL REUS W/TWL XL LVL3 (GOWN DISPOSABLE) ×10
GRAFT BALLN CATH 65CM (STENTS) ×4 IMPLANT
GUIDEWIRE MEIER 185 .035 J-TIP (WIRE) ×1 IMPLANT
KIT BASIN OR (CUSTOM PROCEDURE TRAY) ×5 IMPLANT
KIT DRAIN CSF ACCUDRAIN (MISCELLANEOUS) IMPLANT
KIT TURNOVER KIT B (KITS) ×5 IMPLANT
LEG CONTRALATERAL 16X14.5X10 (Vascular Products) ×1 IMPLANT
LEG CONTRALATERAL 16X14.5X12 (Vascular Products) ×1 IMPLANT
LOOP VESSEL MAXI BLUE 1X16 (MISCELLANEOUS) ×1 IMPLANT
LOOP VESSEL MINI RED (MISCELLANEOUS) ×3 IMPLANT
NS IRRIG 1000ML POUR BTL (IV SOLUTION) ×5 IMPLANT
PACK ENDOVASCULAR (PACKS) ×5 IMPLANT
PACK UNIVERSAL I (CUSTOM PROCEDURE TRAY) ×1 IMPLANT
PAD ARMBOARD 7.5X6 YLW CONV (MISCELLANEOUS) ×10 IMPLANT
PATCH VASC XENOSURE 1CMX6CM (Vascular Products) ×15 IMPLANT
PATCH VASC XENOSURE 1X6 (Vascular Products) IMPLANT
PENCIL BUTTON HOLSTER BLD 10FT (ELECTRODE) ×2 IMPLANT
PERCLOSE PROGLIDE 6F (VASCULAR PRODUCTS) ×20
POWDER SURGICEL 3.0 GRAM (HEMOSTASIS) ×2 IMPLANT
SET MICROPUNCTURE 5F STIFF (MISCELLANEOUS) ×5 IMPLANT
SHEATH BRITE TIP 8FR 23CM (SHEATH) ×5 IMPLANT
SHEATH DRYSEAL FLEX 12FR 33CM (SHEATH) IMPLANT
SHEATH DRYSEAL FLEX 16FR 33CM (SHEATH) IMPLANT
SHEATH PINNACLE 8F 10CM (SHEATH) ×5 IMPLANT
SPONGE GAUZE 2X2 8PLY STRL LF (GAUZE/BANDAGES/DRESSINGS) ×1 IMPLANT
SPONGE T-LAP 18X18 ~~LOC~~+RFID (SPONGE) ×5 IMPLANT
STENT GRAFT BALLN CATH 65CM (STENTS) ×1
STOPCOCK 4 WAY LG BORE MALE ST (IV SETS) ×2 IMPLANT
STOPCOCK MORSE 400PSI 3WAY (MISCELLANEOUS) ×5 IMPLANT
SUT MNCRL AB 4-0 PS2 18 (SUTURE) ×12 IMPLANT
SUT PROLENE 5 0 C 1 24 (SUTURE) ×6 IMPLANT
SUT PROLENE 6 0 BV (SUTURE) ×11 IMPLANT
SUT SILK 2 0 (SUTURE) ×5
SUT SILK 2-0 18XBRD TIE 12 (SUTURE) IMPLANT
SUT SILK 3 0 (SUTURE) ×5
SUT SILK 3-0 18XBRD TIE 12 (SUTURE) IMPLANT
SUT SILK 4 0 (SUTURE) ×5
SUT SILK 4-0 18XBRD TIE 12 (SUTURE) IMPLANT
SUT VIC AB 2-0 CT1 27 (SUTURE) ×10
SUT VIC AB 2-0 CT1 TAPERPNT 27 (SUTURE) IMPLANT
SUT VIC AB 3-0 SH 27 (SUTURE) ×20
SUT VIC AB 3-0 SH 27X BRD (SUTURE) IMPLANT
SYR 20ML LL LF (SYRINGE) ×5 IMPLANT
SYR 3ML LL SCALE MARK (SYRINGE) ×2 IMPLANT
SYR BULB IRRIG 60ML STRL (SYRINGE) ×1 IMPLANT
TAPE STRIPS DRAPE STRL (GAUZE/BANDAGES/DRESSINGS) ×1 IMPLANT
TOWEL GREEN STERILE (TOWEL DISPOSABLE) ×5 IMPLANT
TRAY FOLEY MTR SLVR 16FR STAT (SET/KITS/TRAYS/PACK) ×5 IMPLANT
TUBE CONNECTING 12X1/4 (SUCTIONS) ×1 IMPLANT
TUBING CIL FLEX 10 FLL-RA (TUBING) ×1 IMPLANT
TUBING INJECTOR 48 (MISCELLANEOUS) ×6 IMPLANT
WIRE AMPLATZ SS-J .035X180CM (WIRE) ×10 IMPLANT
WIRE BENTSON .035X145CM (WIRE) ×10 IMPLANT
WIRE MICRO SET SILHO 5FR 7 (SHEATH) ×1 IMPLANT

## 2021-12-26 NOTE — Progress Notes (Addendum)
Case discussed in detail with Dr. Randie Heinz. ?In brief, patient developed bilateral lower extremity dense weakness from waist down after EVAR today. Sensation remains intact. ?Neurology has been consulted and has evaluated the patient. ?We share concern for anterior spinal cord ischemia / stroke. ?I have discussed the case with Dr. [name] of anesthesia to place a spinal drain. ?At the moment, we both agree that a spinal drain is too risky given his INR of 2.1; platelet count of 76.  ?We are repeating the labs now and will see if these have normalized after surgery.  ?If they are normal, Dr. [name] will plan to place a spinal drain to decompress the spinal cord and help cerebral perfusion pressure. ?We will transfer the patient to the ICU. ?Goal MAP >100. Will use levophed if necessary to achieve this. ?Goal Hgb > 10. Tranfuse as necessary to achieve this. ?Goal SpO2 = 100. Use supplemental oxygen as needed.  ? ?Rande Brunt. Lenell Antu, MD ?[facility] ?Office Phone Number: [phone] ?12/26/2021 8:23 PM ? ?ADDENDUM: ?Patient's INR 1.8. Platelet count 84.  ?Discussed with anesthesia team. Still not safe to place spinal drain from coagulopathy standpoint. ?Will order FFP and PLT transfusion.  ? ?Rande Brunt. Lenell Antu, MD ?[facility] ?Office Phone Number: [phone] ?12/26/2021 8:55 PM ? ?

## 2021-12-26 NOTE — Consult Note (Addendum)
? ?NAME:  Allen Arias, MRN:  [id number], DOB:  [birth year], LOS: 0 ?ADMISSION DATE:  12/26/2021, CONSULTATION DATE:  4/21 ?REFERRING MD:  Dr. Randie Heinz, CHIEF COMPLAINT:  BLE weakness; possible spinal cord ischemia ? ?History of Present Illness:  ?Patient is a 81 yo M  from [place] w/ pertinent PMH of HTN, AAA present to [facility] on 4/21 for AAA repair. ? ?Patient admitted 4/21 by vascular surgery and underwent Endovascular aortic aneurysm repair. Patient also underwent BLE thrombectomy. Pre-surgery patient was MAE. Post surgery patient developed BLE severe weakness from waist down with sensation in tact. Concern for possible spinal cord ischemia/stroke. Neurology consulted. Will obtain MRI thoracic and lumbar spine. Transfer to ICU. Starting levo for adequate CPP. PCCM consulted for medical management while in ICU. ? ?Pertinent  Medical History  ? ?Past Medical History:  ?Diagnosis Date  ? AAA (abdominal aortic aneurysm) (HCC)   ? Hypertension   ? ? ?Significant Hospital Events: ?Including procedures, antibiotic start and stop dates in addition to other pertinent events   ?4/21: EVAR for AAA; post surgery BLE weakness concern for spinal cord infarction. MRI pending; neuro consulted. Levo to maintain CPP. Admit to ICU ? ?Interim History / Subjective:  ?Patient AO; does not speak [place]; Daughter in room to translate ?Sensation in tact BLE; very weak BLE ?On room air ?A-line in place ? ?Objective   ?Blood pressure (!) 113/58, pulse 80, temperature 97.8 ?F (36.6 ?C), temperature source Oral, resp. rate 15, height 5\' 4"  (1.626 m), weight 52.2 kg, SpO2 100 %. ?   ?   ? ?Intake/Output Summary (Last 24 hours) at 12/26/2021 1943 ?Last data filed at 12/26/2021 1420 ?Gross per 24 hour  ?Intake 3350 ml  ?Output 1695 ml  ?Net 1655 ml  ? ?Filed Weights  ? 12/26/21 0600  ?Weight: 52.2 kg  ? ? ?Examination: ?General: NAD 12/28/21 Male ?HEENT: MM pink/moist ?Neuro: Aox3; MAE ?CV: s1s2, RRR, no m/r/g ?PULM:  dim clear BS bilaterally; room air ?GI:  soft, bsx4 active  ?Extremities: warm/dry, weakness BLE; sensation present BLE ?Skin: no rashes or lesions appreciated ? ? ?Resolved Hospital Problem list   ? ? ?Assessment & Plan:  ?BLE weakness: s/p EVAR; concern for Possible spinal cord ischemia ?P: ?-admit to ICU ?-Neuro consulted; appreciate recs ?-MRI stat thoracic/lumbar spine ?-Levo for MAP goal >100 or SBP 140-160 to maintain adequate CPP ?-consider spinal drain per neuro; receiving FFP/platelets with INR 2.1 and low platelet count ?-continue foley; monitor UOP ? ?AAA s/p EVAR 4/21 ?B/L LE thrombectomy ?P: ?-per vascular surgery ? ?Leukocytosis ?P: ?-likely reactive post surgery ?-continue ancef for surgical ppx ?-trend cbc ? ?Anemia ?Thrombocytopenia ?Elevated INR ?P: ?-receiving FFP/platelets for procedure above  ?-trend cbc, INR/PTT/PT ? ?Hx of HLD ?Hx of HTN ?P: ?-cont statin ?-hold anti-hypertensives and starting levo for MAP goal above ? ?Hx of BPH ?P: ?-cont foley ?-will hold home flomax ? ? ?Best Practice (right click and "Reselect all SmartList Selections" daily)  ? ?Diet/type: NPO w/ oral meds ?DVT prophylaxis: prophylactic heparin  ?GI prophylaxis: PPI ?Lines: Arterial Line ?Foley:  Yes, and it is still needed ?Code Status:  full code ?Last date of multidisciplinary goals of care discussion [per primary] ? ?Labs   ?CBC: ?Recent Labs  ?Lab 12/26/21 ?1035 12/26/21 ?1218 12/26/21 ?1330  ?WBC  --   --  8.0  ?HGB 10.9* 9.9* 9.0*  ?HCT 32.0* 29.0* 26.3*  ?MCV  --   --  99.2  ?PLT  --   --  12*  ? ? ?  Basic Metabolic Panel: ?Recent Labs  ?Lab 12/26/21 ?1035 12/26/21 ?1218 12/26/21 ?1330  ?NA 138 138 137  ?K 4.0 4.1 4.0  ?CL  --   --  109  ?CO2  --   --  20*  ?GLUCOSE  --   --  180*  ?BUN  --   --  19  ?CREATININE  --   --  1.09  ?CALCIUM  --   --  7.7*  ?MG  --   --  1.7  ? ?GFR: ?Estimated Creatinine Clearance: 41.2 mL/min (by C-G formula based on SCr of 1.09 mg/dL). ?Recent Labs  ?Lab 12/26/21 ?1330  ?WBC 8.0  ? ? ?Liver Function Tests: ?No results for  input(s): AST, ALT, ALKPHOS, BILITOT, PROT, ALBUMIN in the last 168 hours. ?No results for input(s): LIPASE, AMYLASE in the last 168 hours. ?No results for input(s): AMMONIA in the last 168 hours. ? ?ABG ?   ?Component Value Date/Time  ? PHART 7.401 12/26/2021 1218  ? PCO2ART 39.7 12/26/2021 1218  ? PO2ART 212 (H) 12/26/2021 1218  ? HCO3 24.8 12/26/2021 1218  ? TCO2 26 12/26/2021 1218  ? O2SAT 100 12/26/2021 1218  ?  ? ?Coagulation Profile: ?Recent Labs  ?Lab 12/26/21 ?1330  ?INR 2.1*  ? ? ?Cardiac Enzymes: ?No results for input(s): CKTOTAL, CKMB, CKMBINDEX, TROPONINI in the last 168 hours. ? ?HbA1C: ?No results found for: HGBA1C ? ?CBG: ?No results for input(s): GLUCAP in the last 168 hours. ? ?Review of Systems:   ?Review of Systems  ?Constitutional:  Negative for fever.  ?Respiratory:  Negative for shortness of breath and wheezing.   ?Cardiovascular:  Negative for chest pain.  ?Gastrointestinal:  Negative for constipation, diarrhea, nausea and vomiting.  ?Neurological:  Positive for weakness.  ? ? ?Past Medical History:  ?He,  has a past medical history of AAA (abdominal aortic aneurysm) (HCC) and Hypertension.  ? ?Surgical History:  ? ?Past Surgical History:  ?Procedure Laterality Date  ? CARPAL TUNNEL RELEASE    ? EYE SURGERY    ?  ? ?Social History:  ? reports that he has never smoked. He has never used smokeless tobacco.  ? ?Family History:  ?His family history includes Gallbladder disease in his mother; Gastric cancer in his father.  ? ?Allergies ?No Known Allergies  ? ?Home Medications  ?Prior to Admission medications   ?Medication Sig Start Date End Date Taking? Authorizing Provider  ?Ascorbic Acid (VITAMIN C PO) Take 1 capsule by mouth daily.   Yes [provider]  ?Aspirin Buf,CaCarb-MgCarb-MgO, 81 MG TABS Take 81 mg by mouth as needed for pain.   Yes [provider]  ?enalapril (VASOTEC) 10 MG tablet Take 20 mg by mouth daily. 07/18/19  Yes [provider]  ?Melatonin 10 MG  CAPS Take 10 mg by mouth at bedtime.   Yes [provider]  ?Multiple Vitamin (MULTIVITAMIN WITH MINERALS) TABS tablet Take 1 tablet by mouth daily.   Yes [provider]  ?simvastatin (ZOCOR) 20 MG tablet Take 20 mg by mouth at bedtime. 07/18/19  Yes [provider]  ?tamsulosin (FLOMAX) 0.4 MG CAPS capsule Take 0.8 mg by mouth daily. 07/18/19  Yes [provider]  ?traZODone (DESYREL) 50 MG tablet Take 50 mg by mouth at bedtime as needed for sleep.   Yes [provider]  ?VITAMIN D PO Take 1 capsule by mouth daily.   Yes [provider]  ?amLODipine (NORVASC) 5 MG tablet Take 5 mg by mouth daily. ?Patient  not taking: Reported on 12/18/2021 10/01/21   [provider]  ?dutasteride (AVODART) 0.5 MG capsule Take 0.5 mg by mouth daily. ?Patient not taking: Reported on 12/18/2021 01/15/20   [provider]  ?  ? ?Critical Care Time: 45 minutes ?  ? ? ?Patsy LagerJD Gara Kincade, PA-C ?[facility] ?12/26/2021, 7:43 PM ? ?Please see [email or web address] for pager details. ? ?From 7A-7P if no response, please call [phone]. ?After hours, please call [facility] [phone]. ? ? ? ?

## 2021-12-26 NOTE — H&P (Signed)
?  ?H+P ?  ?HPI: ?  ?Allen Arias is a 81 y.o. male otherwise healthy male from [place] recently found to have abdominal aortic aneurysm.  He now follows up with CT scan and also lower extremity duplexes to evaluate for popliteal aneurysms.  He has not no new symptoms.  Denies any back or abdominal pain.  Overall he has been doing well. ? ?Patient accompanied by interpreter this a.m..  ? ?    ?Past Medical History:  ?Diagnosis Date  ? AAA (abdominal aortic aneurysm)    ? Hypertension    ?  ?     ?Family History  ?Problem Relation Age of Onset  ? Gallbladder disease Mother    ?      cholangiocarcinoma  ? Gastric cancer Father    ?  ?     ?Past Surgical History:  ?Procedure Laterality Date  ? CARPAL TUNNEL RELEASE      ?  ?  ?Short Social History:  ?Social History  ?  ?    ?Tobacco Use  ? Smoking status: Never  ? Smokeless tobacco: Never  ?Substance Use Topics  ? Alcohol use: Not on file  ?  ?  ?No Known Allergies ?  ?      ?Current Outpatient Medications  ?Medication Sig Dispense Refill  ? Aspirin Buf,CaCarb-MgCarb-MgO, 81 MG TABS Take 81 mg by mouth as needed for pain.      ? dutasteride (AVODART) 0.5 MG capsule Take 0.5 mg by mouth daily.      ? enalapril (VASOTEC) 10 MG tablet Take 20 mg by mouth daily.      ? ketoconazole (NIZORAL) 2 % shampoo Apply 1 application topically as needed for rash.      ? simvastatin (ZOCOR) 20 MG tablet Take 20 mg by mouth at bedtime.      ? tamsulosin (FLOMAX) 0.4 MG CAPS capsule Take 0.8 mg by mouth daily.      ?  ?No current facility-administered medications for this visit.  ?  ?  ?Review of Systems  ?Constitutional:  Constitutional negative. ?HENT: HENT negative.  ?Eyes: Eyes negative.  ?Respiratory: Respiratory negative.  ?Cardiovascular: Cardiovascular negative.  ?GI: Gastrointestinal negative.  ?Musculoskeletal: Musculoskeletal negative.  ?Skin: Skin negative.  ?Neurological: Neurological negative. ?Hematologic: Hematologic/lymphatic negative.  ?Psychiatric: Psychiatric negative.    ?  ?  ?  ?Objective:  ?  ?Vitals:  ? 12/26/21 0600 12/26/21 0646  ?BP: (!) 165/98 (!) 145/86  ?Pulse: 80   ?Resp: 18   ?Temp: 97.8 ?F (36.6 ?C)   ?SpO2: 98%   ? ? ?  ?Physical Exam ?HENT:  ?   Head: Normocephalic.  ?   Nose:  ?   Comments: Wearing a mask ?Eyes:  ?   Pupils: Pupils are equal, round, and reactive to light.  ?Cardiovascular:  ?   Pulses:     ?     Radial pulses are 2+ on the right side and 2+ on the left side.  ?     Popliteal pulses are 2+ on the right side and 2+ on the left side.  ?Pulmonary:  ?   Effort: Pulmonary effort is normal.  ?Abdominal:  ?   General: Abdomen is flat.  ?   Palpations: Abdomen is soft. There is mass.  ?Musculoskeletal:     ?   General: Normal range of motion.  ?   Right lower leg: No edema.  ?   Left lower leg: No edema.  ?Skin: ?  General: Skin is warm and dry.  ?   Capillary Refill: Capillary refill takes less than 2 seconds.  ?Neurological:  ?   General: No focal deficit present.  ?   Mental Status: He is alert.  ?Psychiatric:     ?   Mood and Affect: Mood normal.     ?   Behavior: Behavior normal.  ?  ?  ?Data: ?CT IMPRESSION: ?Vascular Impression: ?  ?1. Large infrarenal abdominal aortic aneurysm measuring 5.4 cm in ?maximal diameter, increased in size compared to remote aortic ?ultrasound performed in 2013, at which time the abdominal aorta ?measuring 3.5 cm. Abdominal Aortic Aneurysm (ICD10-I71.9). Vascular ?surgery referral/consultation is recommended due to increased risk ?for rupture. This recommendation follows ACR consensus guidelines: ?White Paper of the ACR Incidental Findings Committee II on Vascular ?Findings. J Am Coll Radiol 2013; 10:789-794. ?2. Large amount of irregular calcified and noncalcified ?atherosclerotic plaque throughout the imaged caudal aspect of the ?thoracic aorta which appears at least ectatic measuring 38 mm in ?diameter. Further evaluation with dedicated chest CTA could be ?performed as indicated. Aortic Atherosclerosis  (ICD10-I70.0). ?3. 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 ?4. Large amount of atherosclerotic plaque results in short segment ?at least 75% luminal narrowing of the mildly ectatic right common ?iliac artery which measures 1.8 cm diameter. ?5. Mild ectasia of the left common iliac artery measuring 1.6 cm in ?diameter. ?  ? ?   ?Assessment/Plan:  ?  ?81 year old male with what appears to be greater than 5.5 cm aneurysm by my measurement today.  Plan is for EVAR today in OR. All questions answered via interpreter.  ?  ?  ?  ?Waynetta Sandy MD ?[facility] ?

## 2021-12-26 NOTE — Transfer of Care (Signed)
Immediate Anesthesia Transfer of Care Note ? ?Patient: Allen Arias ? ?Procedure(s) Performed: ABDOMINAL AORTIC ENDOVASCULAR STENT GRAFT (Groin) ?ULTRASOUND GUIDANCE FOR VASCULAR ACCESS (Bilateral: Groin) ?PATCH ANGIOPLASTY USING XENOSURE BIOLOGIC PATCH (1cmx6cm) (Bilateral: Groin) ?RIGHT ENDARTERECTOMY FEMORAL (Right: Groin) ?BILATERAL FEMORAL ARTERY THROMBECTOMY (Bilateral: Groin) ?LEFT ENDARTERECTOMY POPLITEAL WITH ANGIOPLASTY USING XENOSURE BIOPATCH (1cmX6cm) (Left: Leg Lower) ? ?Patient Location: PACU ? ?Anesthesia Type:General ? ?Level of Consciousness: drowsy ? ?Airway & Oxygen Therapy: Patient Spontanous Breathing and Patient connected to nasal cannula oxygen ? ?Post-op Assessment: Report given to RN and Post -op Vital signs reviewed and stable ? ?Post vital signs: Reviewed and stable ? ?Last Vitals:  ?Vitals Value Taken Time  ?BP 120/57 12/26/21 1333  ?Temp    ?Pulse 84 12/26/21 1335  ?Resp 15 12/26/21 1335  ?SpO2 100 % 12/26/21 1335  ?Vitals shown include unvalidated device data. ? ?Last Pain:  ?Vitals:  ? 12/26/21 0609  ?TempSrc:   ?PainSc: 0-No pain  ?   ? ?  ? ?Complications: No notable events documented. ?

## 2021-12-26 NOTE — Progress Notes (Signed)
Report given to Greig Castilla, RN on 2H    ?Greig Castilla to come and take patient to MRI and then to 2H.  All of his questions answered.  VSS  patient denies pain/and no distress noted.  Daughter remains at bedside   ?

## 2021-12-26 NOTE — Progress Notes (Signed)
eLink Physician-Brief Progress Note ?Patient Name: Allen Arias ?DOB: [birth year] ?MRN: [id number] ? ? ?Date of Service ? 12/26/2021  ?HPI/Events of Note ? Patient is s/p endovascular repair of an abdominal aortic aneurysm, and lower extremity thrombectomies, post-operatively he developed bilateral lower extremity weakness and was transferred to the ICU for closer monitoring and pressor to keep MAP > 100 mmHg.  ?eICU Interventions ? New Patient Evaluation.  ? ? ? ?  ? ?Allen Arias ?12/26/2021, 10:27 PM ?

## 2021-12-26 NOTE — Progress Notes (Signed)
Per CCM given patients DBP is low (45) obtaining MAP of 100 may be difficult, start Levo and titrate for goal for SBP 140-160.  ?

## 2021-12-26 NOTE — Progress Notes (Addendum)
Plasma Running Through Right PIV in [facility] ?

## 2021-12-26 NOTE — Progress Notes (Addendum)
Pt states that he cannot move legs and feet.  MD notified and present in room.  MD order for Stat MRI of spinal cord and to hold antihypertensives.    ? 12/26/21 1849  ?Vitals  ?Temp 97.8 ?F (36.6 ?C)  ?Temp Source Oral  ?BP (!) 113/58  ?MAP (mmHg) 75  ?BP Location Right Arm  ?BP Method Automatic  ?Patient Position (if appropriate) Lying  ?Pulse Rate 80  ?Pulse Rate Source Monitor  ?ECG Heart Rate 80  ?Resp 15  ?Level of Consciousness  ?Level of Consciousness Alert  ?Oxygen Therapy  ?SpO2 100 %  ?O2 Device Room Air  ?Art Line  ?Arterial Line BP 153/51  ?Arterial Line MAP (mmHg) 76 mmHg  ?Pain Assessment  ?Pain Scale 0-10  ?Pain Score 2  ?Pain Type Surgical pain  ?Pain Location Leg  ?Pain Orientation Left  ?Pain Intervention(s) Medication (See eMAR)  ?PCA/Epidural/Spinal Assessment  ?Respiratory Pattern Regular;Unlabored  ?Glasgow Coma Scale  ?Eye Opening 4  ?Best Verbal Response (NON-intubated) 5  ?Best Motor Response 6  ?Glasgow Coma Scale Score 15  ?MEWS Score  ?MEWS Temp 0  ?MEWS Systolic 0  ?MEWS Pulse 0  ?MEWS RR 0  ?MEWS LOC 0  ?MEWS Score 0  ?MEWS Score Color Green  ? ?States  ?

## 2021-12-26 NOTE — Op Note (Signed)
? ? ?Patient name: Allen Arias MRN: [id number] DOB: Dec 08, 1940 Sex: male ? ?12/26/2021 ?Pre-operative Diagnosis: AAA ?Post-operative diagnosis:  Same ?Surgeon:  Luanna Salk. Randie Heinz, MD ?Assistants: Sherald Hess, MD; Elna Breslow, MD ?Procedure Performed: ?1.  Endovascular aortic aneurysm repair with main body right 23 x 14 x 12 cm extended with 14 x 10 cm via 16 French sheath and contralateral limb left 14 x 12 cm via 12 French sheath ?2.  Percutaneous bilateral common femoral artery access and closure ?3.  Right common femoral endarterectomy with bovine pericardial patch angioplasty and right lower extremity thrombectomy ?4.  Left common femoral patch angioplasty and left lower extremity thrombectomy ?5.  Left popliteal and tibioperoneal trunk endarterectomy and bovine pericardial patch angioplasty ?6.  Bilateral lower extremity limited angiography ? ? ?Indications: 81 year old male with history of abdominal aortic aneurysm that now meets requirement for repair.  He is indicated for endovascular aneurysm repair for which he presents today.  Risk benefits alternatives were discussed and he demonstrates good understanding via interpreter and plans to proceed. ? ?Findings: There was a tortuous neck we were able to get good seal proximally.  There was significant thrombus in the right common iliac artery which was known from preoperative CT.  After initial placement of endovascular aneurysm graft there was no endoleak identified.  We did initially see some thrombus at the distal common iliac artery on the right extending into the external neck artery.  At completion angiography bilaterally I did not identify any iliac thrombus bilateral common femoral arteries were patent.  After percutaneous closure I had a weak monophasic left anterior tibial signal with no signals in the right.  With this I cannulated the right common femoral artery with micropuncture sheath and performed angiography which demonstrated probable thrombus  in the right SFA.  With this we opened the groin performed endarterectomy of the common femoral artery with bovine pericardial patch angioplasty and performed thrombectomy of the right lower extremity and did have a very good anterior tibial signal at completion.  Unfortunately after this we had no signals in the left leg.  Very similar course we then performed exposure of the left common femoral artery which required patch angioplasty without endarterectomy and left lower extremity thrombectomy.  As there was no signal after this we performed angiography which demonstrated hang up at the level of the tibioperoneal trunk.  The left lower extremity was then prepped in place and endarterectomy was performed at completion was very good posterior tibial signal in the left both feet appeared well perfused. ?  ?Procedure:  The patient was identified in the holding area and taken to the operating room where is placed supine operative table and general anesthesia was induced.  He was sterilely prepped and draped in the bilateral groins and abdomen in the usual fashion, antibiotics were minister timeout was called.  Ultrasound was used to identify the bilateral common femoral arteries and these were percutaneously accessed with micropuncture needles followed by wire and sheath.  Bentson wires were placed bilaterally the wire tract was serially dilated with 8 French sheath dilator and 2 Pro-glide's were placed bilaterally followed by 8 [place] sheath.  We place stiff wires into the descending thoracic aorta and then exchanged on the right for 16 French sheath in the left a 12 French sheath the patient was fully heparinized.  On the left side we brought an Omni catheter on the right side the main body placed this at the bottom of L1 performed aortogram with cranial and  LAO views demonstrated the left renal artery to be the lowest in the main body device was deployed.  We then cannulated the gate from the left.  Unfortunate was  unable to Toradol a Omni catheter within this so I performed angiography which demonstrated we did the work within the gate.  The left hypogastric artery was identified with RAO visualization retrograde angiography and then we extended on the left with a 14 x 12 cm device.  The main body was fully deployed.  Retrograde angiography demonstrated the hypogastric artery and this was extended with 14 x 10 cm device.  The entire graft was ironed out.  Completion demonstrated no endograft however unfortunately there appeared to be some thrombus in the distal right common iliac artery extending into the right external iliac artery.  We performed dedicated retrograde angiography I could not identify any thrombus there was good flow into the sheaths.  With this we remove the sheaths percutaneously closed both groins.  There was initially a monophasic anterior tibial signal on the left and no signals on the right.  Through the same incision on the right I cannulated with micropuncture needle followed by wire sheath and perform limited right lower extremity angiography which demonstrated likely thrombus in the SFA.  We extended the incision in the groin cutdown the common femoral artery expose the external iliac artery as well as the profunda and SFA and placed Vesseloops around these.  Patient was redosed on heparin.  I opened the common femoral longitudinally where there was significant calcified disease.  I performed thrombectomy with a long 3 Fogarty that I was able to get to 50 cm down the SFA did return significant thrombus.  I performed until I had to clean passes and had good backbleeding from both the SFA and profunda.  A bovine pericardial patch was then sewn in place after endarterectomy was performed of the common femoral artery.  We also tacked the endarterectomy distally into the SFA with 6-0 Prolene suture.  Upon completion there was good signal in the SFA and profunda and under the drapes there was a good signal  in the anterior tibial artery.  Unfortunately at this time there was no signal in the left anterior tibial artery.  We then extended this incision on the left and concomitantly closed the right wound.  The cutdown of the common femoral artery exposed the external leg artery as well as the SFA and profunda and placed Vesseloops around these.  I then opened the vessel longitudinally.  Here I extended down to the SFA and elected not to performed endarterectomy.  I performed thrombectomy with 3 Fogarty and did return significant clot but had very minimal backbleeding.  I also Conard Novak the profunda and did have better backbleeding.  I sewed a bovine pericardial patch in place.  Upon completion Doppler demonstrated what appeared to be biphasic flow with a likely a retrograde component concerning for outflow obstruction.  With this we performed angiography of the right lower extremity which demonstrated likely thrombus in the TP trunk and popliteal artery as well as likely chronic disease.  With this this groin wound was closed and there was placed to both groin wounds.  We then prepped and draped the left lower extremity.  I made an incision below the knee to the exposed the popliteal artery.  This was dissected down we salvage the's greater saphenous vein.  The popliteal vein was identified and retracted medially.  The popliteal artery was identified there was pulse  within this and a vessel loop was placed around it.  We divided the anterior tibial vein and placed Vesseloops around the SFA as well as the peroneal and posterior tibial arteries down to the tibioperoneal trunk bifurcation.  There was heavy calcification.  Again the patient was redosed with heparin.  I opened the TP trunk longitudinally open to the popliteal artery.  I was able to establish very strong inflow forming endarterectomy as well as good backbleeding from the peroneal and posterior tibial artery with endarterectomy.  I performed thrombectomy of  the posterior tibial and peroneal arteries directly as well as the anterior tibial artery.  I did not retrieve any further thrombus from there but I did have thrombus within the TP trunk endarterect

## 2021-12-26 NOTE — Consult Note (Signed)
Neurology Consultation ?Reason for Consult: LE weakness ?Referring Physician: Randie Heinz, B ? ?CC: LE weakness ? ?History is obtained from:Patient's daughter ? ?HPI: Allen Arias is a 81 y.o. 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  Following surgery, it was noted that he was moving his legs.  His daughter reports that he was moving them fine prior to surgery, but is not moving them now.  The patient describes that he has feeling in his legs, but it is decreased below the knee.  He denies any pain other than around the surgical site. ? ? ?LKW: 8 AM, prior to surgery ? ? ?Past Medical History:  ?Diagnosis Date  ? AAA (abdominal aortic aneurysm) (HCC)   ? Hypertension   ? ? ? ?Family History  ?Problem Relation Age of Onset  ? Gallbladder disease Mother   ?     cholangiocarcinoma  ? Gastric cancer Father   ? ? ? ?Social History:  reports that he has never smoked. He has never used smokeless tobacco. No history on file for alcohol use and drug use. ? ? ?Exam: ?Current vital signs: ?BP (!) 113/58 (BP Location: Right Arm)   Pulse 72   Temp 97.8 ?F (36.6 ?C) (Oral)   Resp 18   Ht 5\' 4"  (1.626 m)   Wt 52.2 kg   SpO2 98%   BMI 19.74 kg/m?  ?Vital signs in last 24 hours: ?Temp:  [97.8 ?F (36.6 ?C)-98 ?F (36.7 ?C)] 97.8 ?F (36.6 ?C) (04/21 1849) ?Pulse Rate:  [71-100] 72 (04/21 2000) ?Resp:  [11-20] 18 (04/21 2000) ?BP: (112-165)/(57-98) 113/58 (04/21 1849) ?SpO2:  [97 %-100 %] 98 % (04/21 2000) ?Arterial Line BP: (120-155)/(44-67) 125/67 (04/21 2000) ?Weight:  [52.2 kg] 52.2 kg (04/21 0600) ? ? ?Physical Exam  ?Constitutional: Appears well-developed and well-nourished.  ?Psych: Affect appropriate to situation ?Eyes: No scleral injection ?HENT: No OP obstruction ?MSK: no joint deformities.  ?Cardiovascular: Normal rate and regular rhythm.  ?Respiratory: Effort normal, non-labored breathing ?GI: Soft.  No distension. There is no tenderness.  ?Skin: WDI ? ?Neuro: ?Mental  Status: ?Patient is awake, alert, interactive and appropriate  ?patient is able to give a clear and coherent history. ?No signs of aphasia or neglect ?Cranial Nerves: ?II: Visual Fields are full. Pupils are equal, round, and reactive to light.   ?III,IV, VI: EOMI without ptosis or diploplia.  ?V: Facial sensation is symmetric to temperature ?VII: Facial movement is symmetric.  ?VIII: hearing is intact to voice ?X: Uvula elevates symmetrically ?XI: Shoulder shrug is symmetric. ?XII: tongue is midline without atrophy or fasciculations.  ?Motor: ?Tone is normal. Bulk is normal. 5/5 strength was present in the arms, strength is symmetric, he has 2/5 hip flexion, 3/5 adduction and abduction, 0/5 knee extension/flexion, 0/5 ankle extension/flexion ?Sensory: ?Sensation is symmetric to light touch and temperature in the arms  ?He reports decreased sensation below the knees to light touch, no definite spinal level on his back to pinprick.  He has diminished vibration at the toes bilaterally, intact in the right ankle, diminished in the left ankle ?Deep Tendon Reflexes: ?Diminished at the patella and ankle  ?plantars: ?Toes are equivocal bilaterally  ?cerebellar: ?No clear ataxia in the arms ? ? ?I have reviewed labs in epic and the results pertinent to this consultation are: ?INR 2.1 ? ? ?Impression: 81 year old male with bilateral lower extremity weakness after surgery.  My suspicion is he has had anterior spinal cord ischemia given that he has relative preservation  of sensation with severe loss of strength.  Other etiologies such as bilateral sciatic neuropathy, or compressive spinal myelopathy, are felt to be less likely.  I do think it would be prudent to rule out compressive etiology with MRI. ? ?Recommendations: ?1) MRI T and L-spine ?2) consider spinal drain to assist with perfusion.  ?3) neurology will follow ? ? ?Ritta Slot, MD ?[facility] ?[phone] ? ?If 7pm- 7am, please page neurology on  call as listed in AMION. ? ?

## 2021-12-26 NOTE — Anesthesia Procedure Notes (Signed)
Arterial Line Insertion ?Start/End4/21/2023 7:05 AM, 12/26/2021 7:10 AM ?Performed by: Waynard Edwards, CRNA, CRNA ? Preanesthetic checklist: patient identified, IV checked, site marked, risks and benefits discussed, surgical consent, monitors and equipment checked, pre-op evaluation, timeout performed and anesthesia consent ?Lidocaine 1% used for infiltration ?Left, radial was placed ?Catheter size: 20 G ?Hand hygiene performed  and maximum sterile barriers used  ?Allen's test indicative of satisfactory collateral circulation ?Attempts: 1 ?Procedure performed without using ultrasound guided technique. ?Following insertion, dressing applied and Biopatch. ?Post procedure assessment: normal and unchanged ? ?Patient tolerated the procedure well with no immediate complications. ? ? ?

## 2021-12-26 NOTE — Anesthesia Procedure Notes (Signed)
Procedure Name: Intubation ?Date/Time: 12/26/2021 7:48 AM ?Performed by: Colin Benton, CRNA ?Pre-anesthesia Checklist: Patient identified, Emergency Drugs available, Suction available and Patient being monitored ?Patient Re-evaluated:Patient Re-evaluated prior to induction ?Oxygen Delivery Method: Circle system utilized ?Preoxygenation: Pre-oxygenation with 100% oxygen ?Induction Type: IV induction ?Ventilation: Mask ventilation without difficulty ?Laryngoscope Size: Sabra Heck and 2 ?Grade View: Grade I ?Tube type: Oral ?Tube size: 7.5 mm ?Number of attempts: 1 ?Airway Equipment and Method: Stylet ?Placement Confirmation: ETT inserted through vocal cords under direct vision, positive ETCO2 and breath sounds checked- equal and bilateral ?Secured at: 21 cm ?Tube secured with: Tape ?Dental Injury: Teeth and Oropharynx as per pre-operative assessment  ? ? ? ? ?

## 2021-12-26 NOTE — Progress Notes (Signed)
Pt successfully transferred to MRI with no events.  ?

## 2021-12-27 ENCOUNTER — Inpatient Hospital Stay (HOSPITAL_COMMUNITY): Payer: Medicare Other

## 2021-12-27 DIAGNOSIS — Z9889 Other specified postprocedural states: Secondary | ICD-10-CM

## 2021-12-27 DIAGNOSIS — Z8679 Personal history of other diseases of the circulatory system: Secondary | ICD-10-CM

## 2021-12-27 DIAGNOSIS — I714 Abdominal aortic aneurysm, without rupture, unspecified: Secondary | ICD-10-CM | POA: Diagnosis not present

## 2021-12-27 LAB — BPAM FFP
Blood Product Expiration Date: 202304262359
Blood Product Expiration Date: 202304262359
ISSUE DATE / TIME: 202304212226
ISSUE DATE / TIME: 202304212229
Unit Type and Rh: 2800
Unit Type and Rh: 8400

## 2021-12-27 LAB — CBC
HCT: 23.2 % — ABNORMAL LOW (ref 39.0–52.0)
Hemoglobin: 7.8 g/dL — ABNORMAL LOW (ref 13.0–17.0)
MCH: 31.2 pg (ref 26.0–34.0)
MCHC: 33.6 g/dL (ref 30.0–36.0)
MCV: 92.8 fL (ref 80.0–100.0)
Platelets: 82 10*3/uL — ABNORMAL LOW (ref 150–400)
RBC: 2.5 MIL/uL — ABNORMAL LOW (ref 4.22–5.81)
RDW: 15.9 % — ABNORMAL HIGH (ref 11.5–15.5)
WBC: 9.1 10*3/uL (ref 4.0–10.5)
nRBC: 0 % (ref 0.0–0.2)

## 2021-12-27 LAB — BPAM PLATELET PHERESIS
Blood Product Expiration Date: 202304222359
ISSUE DATE / TIME: 202304212226
Unit Type and Rh: 6200

## 2021-12-27 LAB — PREPARE PLATELET PHERESIS: Unit division: 0

## 2021-12-27 LAB — PREPARE FRESH FROZEN PLASMA
Unit division: 0
Unit division: 0

## 2021-12-27 LAB — BASIC METABOLIC PANEL
Anion gap: 10 (ref 5–15)
BUN: 23 mg/dL (ref 8–23)
CO2: 23 mmol/L (ref 22–32)
Calcium: 7.9 mg/dL — ABNORMAL LOW (ref 8.9–10.3)
Chloride: 105 mmol/L (ref 98–111)
Creatinine, Ser: 1.3 mg/dL — ABNORMAL HIGH (ref 0.61–1.24)
GFR, Estimated: 56 mL/min — ABNORMAL LOW (ref >60)
Glucose, Bld: 133 mg/dL — ABNORMAL HIGH (ref 70–99)
Potassium: 3.7 mmol/L (ref 3.5–5.1)
Sodium: 138 mmol/L (ref 135–145)

## 2021-12-27 LAB — LIPID PANEL
Cholesterol: 107 mg/dL (ref 0–200)
HDL: 33 mg/dL — ABNORMAL LOW (ref >40)
LDL Cholesterol: 48 mg/dL (ref 0–99)
Total CHOL/HDL Ratio: 3.2 RATIO
Triglycerides: 129 mg/dL (ref <150)
VLDL: 26 mg/dL (ref 0–40)

## 2021-12-27 LAB — APTT: aPTT: 30 seconds (ref 24–36)

## 2021-12-27 LAB — PROTIME-INR
INR: 1.4 — ABNORMAL HIGH (ref 0.8–1.2)
Prothrombin Time: 16.6 seconds — ABNORMAL HIGH (ref 11.4–15.2)

## 2021-12-27 IMAGING — MR MR CERVICAL SPINE W/O CM
5 series · 37 of 48 positions shown · non-contrast
Comparison: None.

CLINICAL DATA: Acute myelopathy

EXAM:
MRI CERVICAL SPINE WITHOUT CONTRAST
TECHNIQUE: Multiplanar, multisequence MR imaging of the cervical spine was
performed. No intravenous contrast was administered.

[Series 5: T2 · sagittal · 3.0mm · 0.69mm/px · 6 of 15 slices shown (1 of 2)]
[im 1/15]
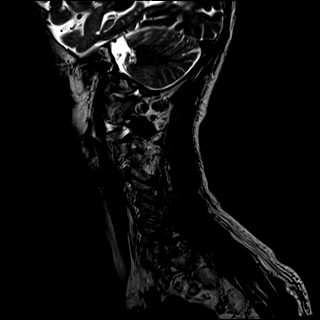
[im 3/15]
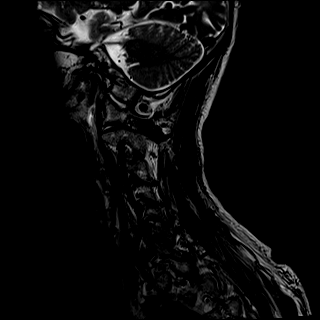
[im 6/15]
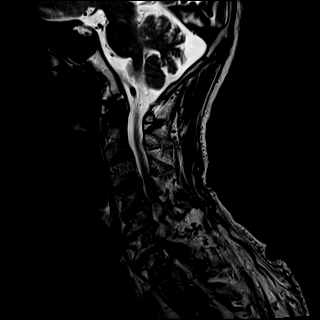
[im 9/15]
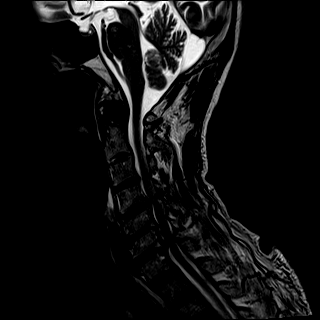
[im 12/15]
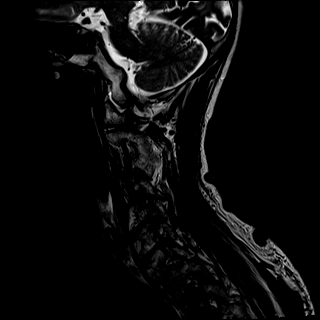
[im 15/15]
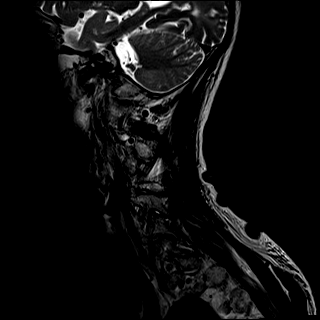

[Series 6: T1 · sagittal · 3.0mm · 0.69mm/px · 5 of 15 slices shown]
[im 1/15]
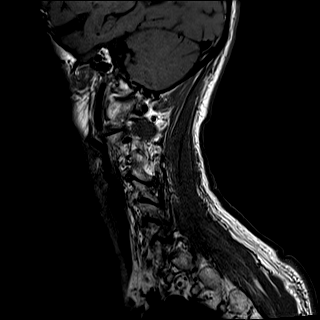
[im 4/15]
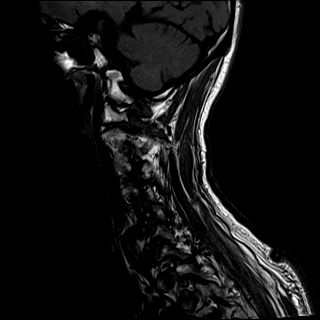
[im 8/15]
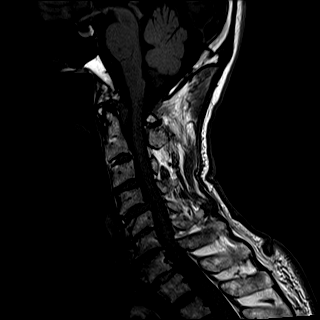
[im 11/15]
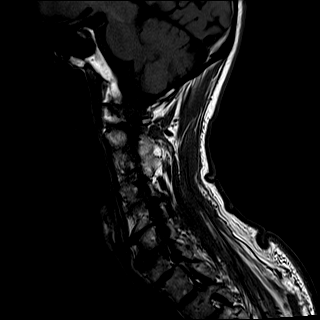
[im 15/15]
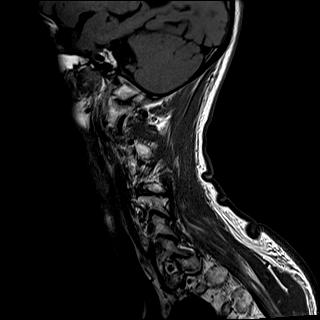

[Series 7: STIR · sagittal · 3.0mm · 0.86mm/px · 5 of 15 slices shown]
[im 1/15]
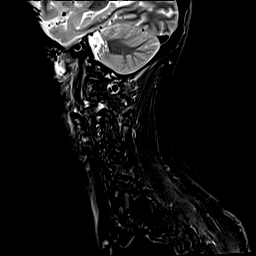
[im 4/15]
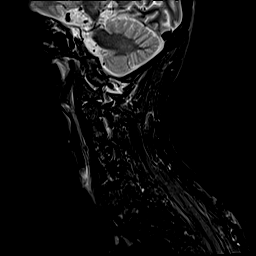
[im 8/15]
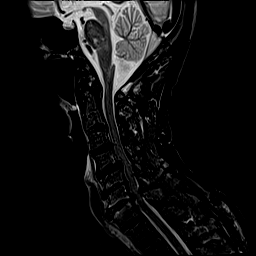
[im 11/15]
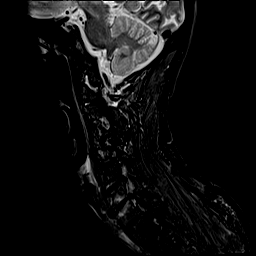
[im 15/15]
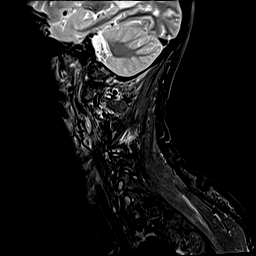

[Series 8: T2 · axial · 3.0mm · 0.66mm/px · z∈[-127,+8]mm · 13 of 44 slices shown (2 of 2)]
[im 1/44]
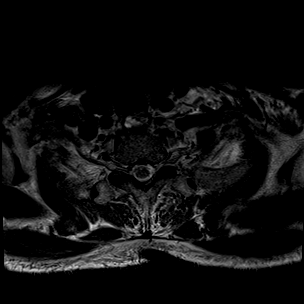
[im 3/44]
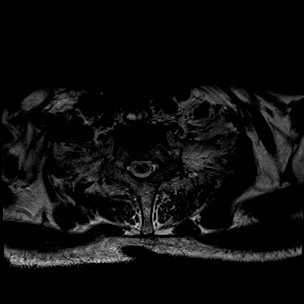
[im 6/44]
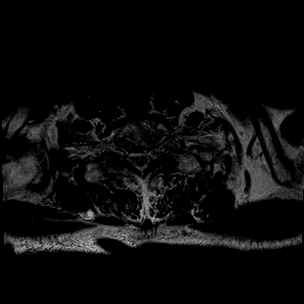
[im 9/44]
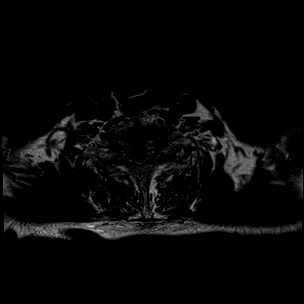
[im 12/44]
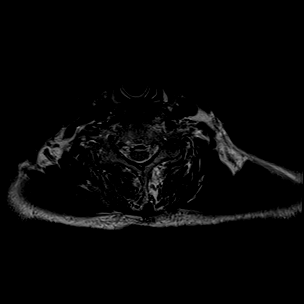
[im 15/44]
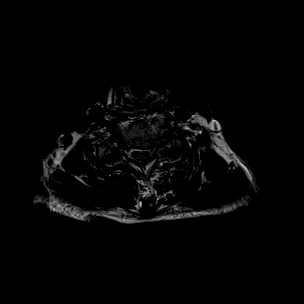
[im 18/44]
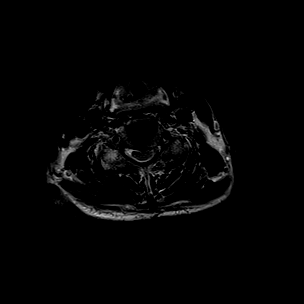
[im 21/44]
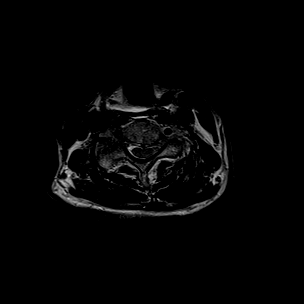
[im 23/44]
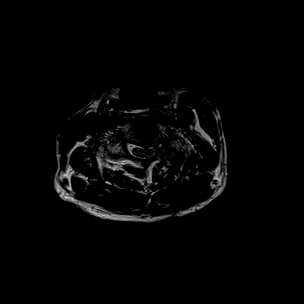
[im 26/44]
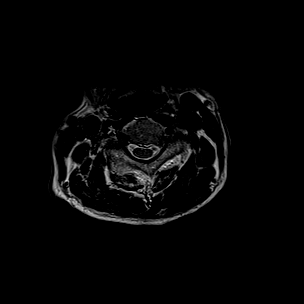
[im 32/44]
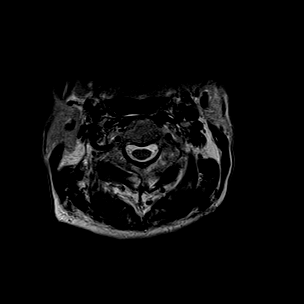
[im 38/44]
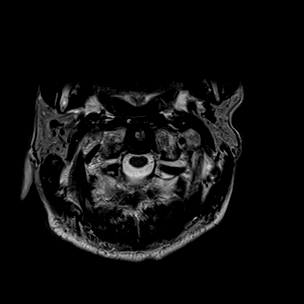
[im 44/44]
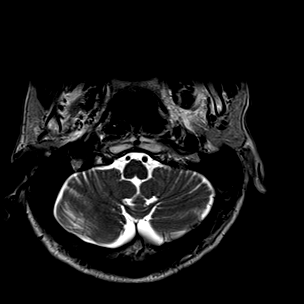

[Series 9: GRE · axial · 3.0mm · 0.39mm/px · z∈[-121,+8]mm · 8 of 44 slices shown]
[im 3/44]
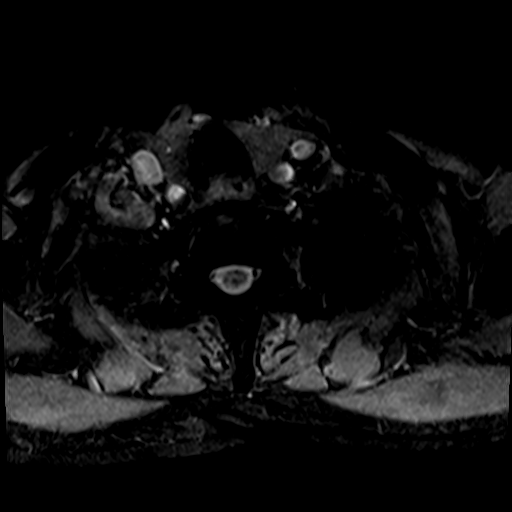
[im 9/44]
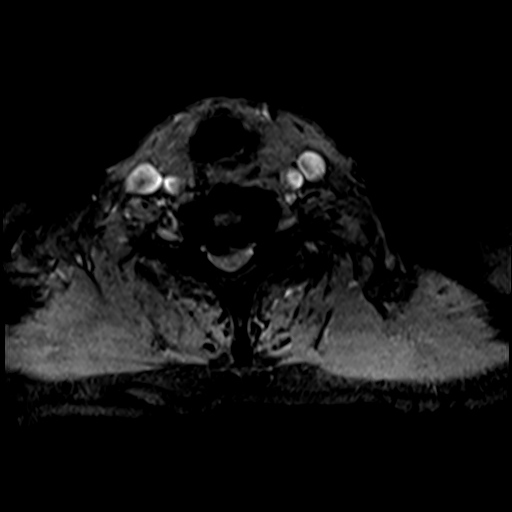
[im 15/44]
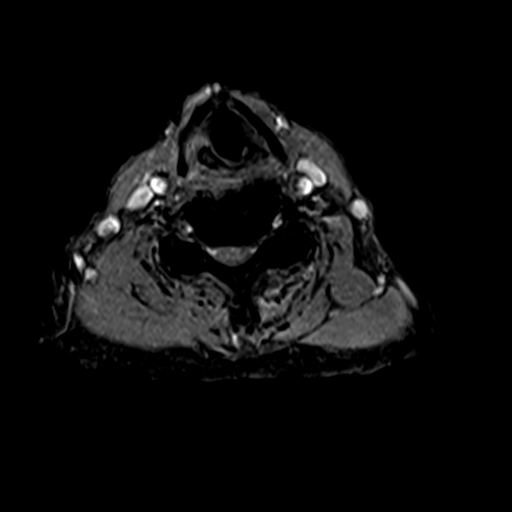
[im 21/44]
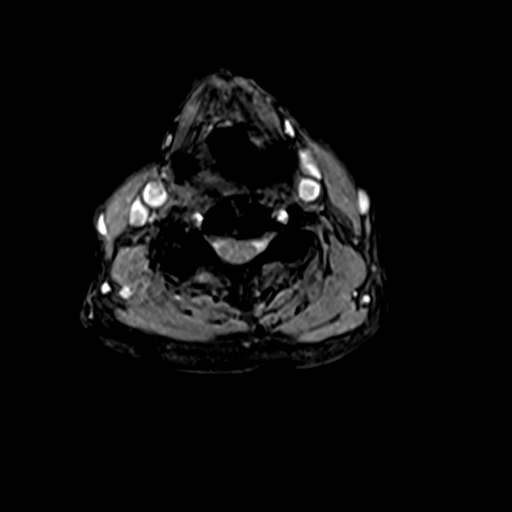
[im 26/44]
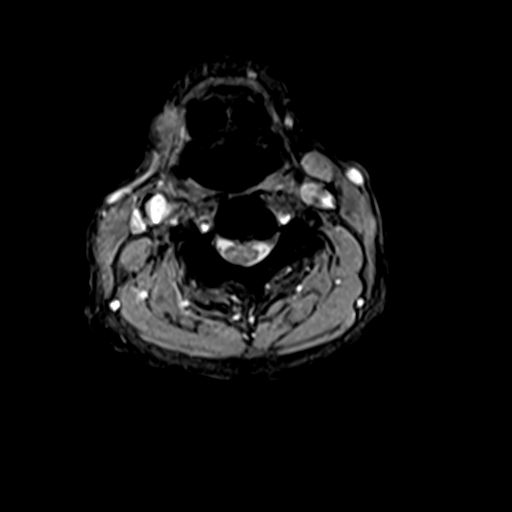
[im 32/44]
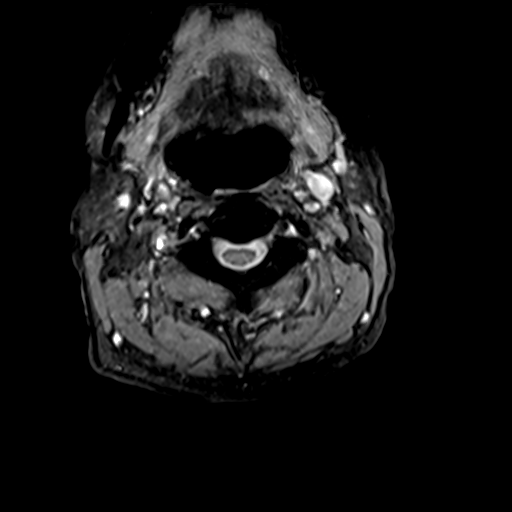
[im 38/44]
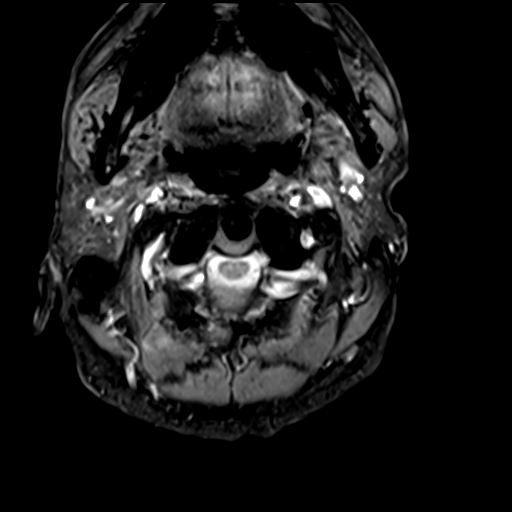
[im 44/44]
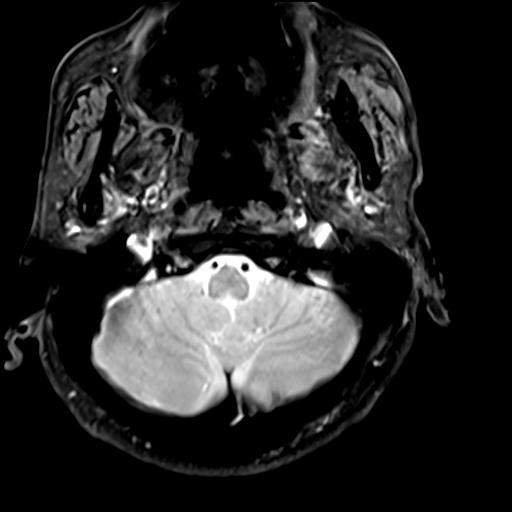

[37 of 48 positions shown; findings below may reference images not displayed]

FINDINGS: Alignment: Grade 1 anterolisthesis at C4-5

Vertebrae: No fracture, evidence of discitis, or bone lesion.

Cord: Normal signal and morphology.

Posterior Fossa, vertebral arteries, paraspinal tissues: Negative.

Disc levels:

C1-2: Unremarkable.

C2-3: Normal disc space and facet joints. There is no spinal canal
stenosis. No neural foraminal stenosis.

C3-4: Left subarticular disc protrusion and left uncovertebral
hypertrophy. Mild spinal canal stenosis. Severe left neural
foraminal stenosis.

C4-5: Small disc bulge with endplate spurring. Severe spinal canal
stenosis. There is mass effect on the spinal cord. Severe left and
moderate right neural foraminal stenosis.

C5-6: Small disc bulge with bilateral uncovertebral hypertrophy.
There is no spinal canal stenosis. No neural foraminal stenosis.

C6-7: Large disc bulge with bilateral uncovertebral hypertrophy.
Severe spinal canal stenosis. Mass effect on the spinal cord. Severe
bilateral neural foraminal stenosis.

C7-T1: Normal disc space and facet joints. There is no spinal canal
stenosis. No neural foraminal stenosis.
IMPRESSION: 1. Severe spinal canal stenosis at C4-5 and C6-7 with impingement of
the spinal cord and severe neural foraminal stenosis at both levels.
2. Severe left C3-4 and left C4-5 neural foraminal stenosis.
3. Grade 1 anterolisthesis at C4-5.

## 2021-12-27 NOTE — Consult Note (Signed)
Reason for Consult: Weakness ?Referring Physician: Vascular surgery ? ?Allen Arias is an 81 y.o. male.  ?HPI: 81 year old male status post endovascular aortic aneurysm repair yesterday.  Patient awakened postoperatively with severe bilateralRebeca Alert lower extremity weakness with sensory sparing.  No complaints of neck pain.  No radiating pain numbness or weakness into his upper extremities.  No loss of dexterity or fine motor movement in his hands.  Patient has no sacral or perineal numbness. ? ?Past Medical History:  ?Diagnosis Date  ? AAA (abdominal aortic aneurysm) (HCC)   ? Hypertension   ? ? ?Past Surgical History:  ?Procedure Laterality Date  ? CARPAL TUNNEL RELEASE    ? EYE SURGERY    ? ? ?Family History  ?Problem Relation Age of Onset  ? Gallbladder disease Mother   ?     cholangiocarcinoma  ? Gastric cancer Father   ? ? ?Social History:  reports that he has never smoked. He has never used smokeless tobacco. No history on file for alcohol use and drug use. ? ?Allergies: No Known Allergies ? ?Medications: I have reviewed the patient's current medications. ? ?Results for orders placed or performed during the hospital encounter of 12/26/21 (from the past 48 hour(s))  ?Surgical PCR screen     Status: None  ? Collection Time: 12/26/21  5:46 AM  ? Specimen: Nasal Mucosa; Nasal Swab  ?Result Value Ref Range  ? MRSA, PCR NEGATIVE NEGATIVE  ? Staphylococcus aureus NEGATIVE NEGATIVE  ?  Comment: (NOTE) ?The Xpert SA Assay (FDA approved for NASAL specimens in patients 7622 ?years of age and older), is one component of a comprehensive ?surveillance program. It is not intended to diagnose infection nor to ?guide or monitor treatment. ?Performed at [facility], [address] [place], [place] ?[id number] ?  ?ABO/Rh     Status: None  ? Collection Time: 12/26/21  7:10 AM  ?Result Value Ref Range  ? ABO/RH(D)    ?  AB POS ?Performed at [facility], [address] [place], [place] [address] ?  ?POCT Activated clotting  time     Status: None  ? Collection Time: 12/26/21  8:45 AM  ?Result Value Ref Range  ? Activated Clotting Time 185 seconds  ?  Comment: Reference range 74-137 seconds for patients not on anticoagulant therapy.  ?I-STAT 7, (LYTES, BLD GAS, ICA, H+H)     Status: Abnormal  ? Collection Time: 12/26/21 10:35 AM  ?Result Value Ref Range  ? pH, Arterial 7.434 7.35 - 7.45  ? pCO2 arterial 36.3 32 - 48 mmHg  ? pO2, Arterial 268 (H) 83 - 108 mmHg  ? Bicarbonate 24.8 20.0 - 28.0 mmol/L  ? TCO2 26 22 - 32 mmol/L  ? O2 Saturation 100 %  ? Acid-Base Excess 0.0 0.0 - 2.0 mmol/L  ? Sodium 138 135 - 145 mmol/L  ? Potassium 4.0 3.5 - 5.1 mmol/L  ? Calcium, Ion 1.13 (L) 1.15 - 1.40 mmol/L  ? HCT 32.0 (L) 39.0 - 52.0 %  ? Hemoglobin 10.9 (L) 13.0 - 17.0 g/dL  ? Patient temperature 35.0 C   ? Sample type ARTERIAL   ?Prepare RBC (crossmatch)     Status: None  ? Collection Time: 12/26/21 11:30 AM  ?Result Value Ref Range  ? Order Confirmation    ?  ORDER PROCESSED BY BLOOD BANK ?Performed at [facility], [address] [place], [place] [address] ?  ?POCT Activated clotting time     Status:  None  ? Collection Time: 12/26/21 12:05 PM  ?Result Value Ref Range  ? Activated Clotting Time 588 seconds  ?  Comment: Reference range 74-137 seconds for patients not on anticoagulant therapy.  ?I-STAT 7, (LYTES, BLD GAS, ICA, H+H)     Status: Abnormal  ? Collection Time: 12/26/21 12:18 PM  ?Result Value Ref Range  ? pH, Arterial 7.401 7.35 - 7.45  ? pCO2 arterial 39.7 32 - 48 mmHg  ? pO2, Arterial 212 (H) 83 - 108 mmHg  ? Bicarbonate 24.8 20.0 - 28.0 mmol/L  ? TCO2 26 22 - 32 mmol/L  ? O2 Saturation 100 %  ? Acid-Base Excess 0.0 0.0 - 2.0 mmol/L  ? Sodium 138 135 - 145 mmol/L  ? Potassium 4.1 3.5 - 5.1 mmol/L  ? Calcium, Ion 1.13 (L) 1.15 - 1.40 mmol/L  ? HCT 29.0 (L) 39.0 - 52.0 %  ? Hemoglobin 9.9 (L) 13.0 - 17.0 g/dL  ? Patient temperature 36.5 C   ? Sample type ARTERIAL   ?POCT Activated clotting time     Status: None  ? Collection Time:  12/26/21 12:27 PM  ?Result Value Ref Range  ? Activated Clotting Time 479 seconds  ?  Comment: Reference range 74-137 seconds for patients not on anticoagulant therapy.  ?POCT Activated clotting time     Status: None  ? Collection Time: 12/26/21 12:56 PM  ?Result Value Ref Range  ? Activated Clotting Time 143 seconds  ?  Comment: Reference range 74-137 seconds for patients not on anticoagulant therapy.  ?CBC     Status: Abnormal  ? Collection Time: 12/26/21  1:30 PM  ?Result Value Ref Range  ? WBC 8.0 4.0 - 10.5 K/uL  ? RBC 2.65 (L) 4.22 - 5.81 MIL/uL  ? Hemoglobin 9.0 (L) 13.0 - 17.0 g/dL  ? HCT 26.3 (L) 39.0 - 52.0 %  ? MCV 99.2 80.0 - 100.0 fL  ? MCH 34.0 26.0 - 34.0 pg  ? MCHC 34.2 30.0 - 36.0 g/dL  ? RDW 12.0 11.5 - 15.5 %  ? Platelets 76 (L) 150 - 400 K/uL  ?  Comment: Immature Platelet Fraction may be ?clinically indicated, consider ?ordering this additional test ?[id number] ?REPEATED TO VERIFY ?PLATELET COUNT CONFIRMED BY SMEAR ?  ? nRBC 0.0 0.0 - 0.2 %  ?  Comment: Performed at [facility], [address] [place], [place] [address]  ?Basic metabolic panel     Status: Abnormal  ? Collection Time: 12/26/21  1:30 PM  ?Result Value Ref Range  ? Sodium 137 135 - 145 mmol/L  ? Potassium 4.0 3.5 - 5.1 mmol/L  ? Chloride 109 98 - 111 mmol/L  ? CO2 20 (L) 22 - 32 mmol/L  ? Glucose, Bld 180 (H) 70 - 99 mg/dL  ?  Comment: Glucose reference range applies only to samples taken after fasting for at least 8 hours.  ? BUN 19 8 - 23 mg/dL  ? Creatinine, Ser 1.09 0.61 - 1.24 mg/dL  ? Calcium 7.7 (L) 8.9 - 10.3 mg/dL  ? GFR, Estimated >60 >60 mL/min  ?  Comment: (NOTE) ?Calculated using the CKD-EPI Creatinine Equation (2021) ?  ? Anion gap 8 5 - 15  ?  Comment: Performed at [facility], [address] [place], [place] [address]  ?Protime-INR     Status: Abnormal  ? Collection Time: 12/26/21  1:30 PM  ?Result Value Ref Range  ? Prothrombin Time 23.7 (H) 11.4 - 15.2 seconds  ?  INR 2.1 (H) 0.8 - 1.2  ?  Comment:  (NOTE) ?INR goal varies based on device and disease states. ?Performed at [facility], [address] [place], [place] ?[address] ?  ?APTT     Status: Abnormal  ? Collection Time: 12/26/21  1:30 PM  ?Result Value Ref Range  ? aPTT 56 (H) 24 - 36 seconds  ?  Comment:        ?IF BASELINE aPTT IS ELEVATED, ?SUGGEST PATIENT RISK ASSESSMENT ?BE USED TO DETERMINE APPROPRIATE ?ANTICOAGULANT THERAPY. ?Performed at [facility], [address], [place], [place] [address] ?  ?Magnesium     Status: None  ? Collection Time: 12/26/21  1:30 PM  ?Result Value Ref Range  ? Magnesium 1.7 1.7 - 2.4 mg/dL  ?  Comment: Performed at [facility], [address], [place], [place] [address]  ?Protime-INR     Status: Abnormal  ? Collection Time: 12/26/21  7:44 PM  ?Result Value Ref Range  ? Prothrombin Time 20.5 (H) 11.4 - 15.2 seconds  ? INR 1.8 (H) 0.8 - 1.2  ?  Comment: (NOTE) ?INR goal varies based on device and disease states. ?Performed at [facility], [address], [place], [place] ?[address] ?  ?CBC     Status: Abnormal  ? Collection Time: 12/26/21  7:44 PM  ?Result Value Ref Range  ? WBC 14.6 (H) 4.0 - 10.5 K/uL  ? RBC 2.66 (L) 4.22 - 5.81 MIL/uL  ? Hemoglobin 8.8 (L) 13.0 - 17.0 g/dL  ? HCT 26.4 (L) 39.0 - 52.0 %  ? MCV 99.2 80.0 - 100.0 fL  ? MCH 33.1 26.0 - 34.0 pg  ? MCHC 33.3 30.0 - 36.0 g/dL  ? RDW 12.1 11.5 - 15.5 %  ? Platelets 84 (L) 150 - 400 K/uL  ?  Comment: Immature Platelet Fraction may be ?clinically indicated, consider ?ordering this additional test ?[id number] ?CONSISTENT WITH PREVIOUS RESULT ?REPEATED TO VERIFY ?  ? nRBC 0.0 0.0 - 0.2 %  ?  Comment: Performed at [facility], [address] [place], [place] [address]  ?Prepare fresh frozen plasma     Status: None (Preliminary result)  ? Collection Time: 12/26/21  8:56 PM  ?Result Value Ref Range  ? Unit Number [id number]   ? Blood Component Type THAWED PLASMA   ? Unit division 00   ? Status of Unit ISSUED   ? Transfusion  Status OK TO TRANSFUSE   ? Unit Number [id number]   ? Blood Component Type THAWED PLASMA   ? Unit division 00   ? Status of Unit ISSUED   ? Transfusion Status OK TO TRANSFUSE   ?Prepare platelet pheresis

## 2021-12-27 NOTE — Procedures (Incomplete)
Dr. Leonel Ramsay gave daughter updates regarding MRI results over the phone. RN asked Dr. Leonel Ramsay if it was acceptable to finish the ordered platelets and plasma prior to Cervical MRI and was given the okay to finish the products then go.  ?

## 2021-12-27 NOTE — Progress Notes (Signed)
Inpatient Rehab Admissions Coordinator Note:  ? ?Per PT/OT patient was screened for CIR candidacy by Carmina Walle Luvenia Starch, CCC-SLP. At this time, pt appears to be a potential candidate for CIR. I will place an order for rehab consult for full assessment, per our protocol.  Please contact me any with questions.. ? ? ?Wolfgang Phoenix, MS, CCC-SLP ?Admissions Coordinator ?[phone] ?12/27/21 ?6:06 PM ? ?

## 2021-12-27 NOTE — Consult Note (Signed)
? ?NAME:  Allen Arias, MRN:  [id number], DOB:  10-05-1940, LOS: 1 ?ADMISSION DATE:  12/26/2021, CONSULTATION DATE:  4/21 ?REFERRING MD:  Dr. Donzetta Matters, CHIEF COMPLAINT:  BLE weakness; possible spinal cord ischemia ? ?History of Present Illness:  ?Patient is a 81 yo M  from [place] w/ pertinent PMH of HTN, AAA present to [facility] on 4/21 for AAA repair. ? ?Patient admitted 4/21 by vascular surgery and underwent Endovascular aortic aneurysm repair. Patient also underwent BLE thrombectomy. Pre-surgery patient was MAE. Post surgery patient developed BLE severe weakness from waist down with sensation in tact. Concern for possible spinal cord ischemia/stroke. Neurology consulted. Will obtain MRI thoracic and lumbar spine. Transfer to ICU. Starting levo for adequate CPP. PCCM consulted for medical management while in ICU. ? ?Pertinent  Medical History  ? ?Past Medical History:  ?Diagnosis Date  ? AAA (abdominal aortic aneurysm) ([place])   ? Hypertension   ? ? ?Significant Hospital Events: ?Including procedures, antibiotic start and stop dates in addition to other pertinent events   ?4/21: EVAR for AAA; post surgery BLE weakness concern for spinal cord infarction. MRI pending; neuro consulted. Levo to maintain CPP. Admit to ICU ?4/22 MRI of the spine reassuring.  Awaiting neurosurgical recommendations, discussed case vascular surgery ? ?Interim History / Subjective:  ? ?Sitting up in chair still has difficulty moving lower extremities.  Family at bedside ? ?Objective   ?Blood pressure 134/60, pulse (!) 101, temperature 99.5 ?F (37.5 ?C), temperature source Axillary, resp. rate 14, height 5' 4" (1.626 m), weight 52.2 kg, SpO2 96 %. ?   ?   ? ?Intake/Output Summary (Last 24 hours) at 12/27/2021 1014 ?Last data filed at 12/27/2021 0630 ?Gross per 24 hour  ?Intake 6195.02 ml  ?Output 1730 ml  ?Net 4465.02 ml  ? ? ?Filed Weights  ? 12/26/21 0600  ?Weight: 52.2 kg  ? ? ?Examination: ?General: Sitting up in chair, no acute distress ?HEENT: Mucous  membranes dry, tracking appropriately ?Neuro: Alert and oriented x3, weakness in dorsi and plantarflexion inability to move toes or dorsiflex hallux bilaterally upper extremity motor seems to be intact, decreased sensation in the lower extremities to touch ?CV: Regular rate rhythm, S1-S2 ?PULM: Bilateral breath sounds no crackles or wheeze ?GI: Soft nontender nondistended ?Extremities: Motor weakness as described above, both lower extremities cool to the touch, slight increased swelling in the right calf compared to left ?Skin: Cool extremities to touch ? ? ?Resolved Hospital Problem list   ? ? ?Assessment & Plan:  ? 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 ?Remains on low-dose Levophed at this time. ?Awaiting neurosurgery recommendations regarding cervical stenosis ?Case discussed with vascular surgery. ?Pulmonary critical care will follow along for vasopressor management. ? ?AAA s/p EVAR 4/21 ?B/L LE thrombectomy ?P: ?-Per vascular surgery ? ?Leukocytosis ?P: ?-Likely reactive from postop ? ?Anemia ?Thrombocytopenia ?Elevated INR ?P: ?-Already received FFP and platelets ?Continue to follow CBC INR/PTT/PT as needed. ? ?Hx of HLD ?Hx of HTN ?P: ?-Continue statin ?Holding antihypertensives ? ?Hx of BPH ?P: ?Foley in place, holding home Flomax ? ? ?Best Practice (right click and "Reselect all SmartList Selections" daily)  ? ?Diet/type: NPO w/ oral meds ?DVT prophylaxis: prophylactic heparin  ?GI prophylaxis: PPI ?Lines: Arterial Line ?Foley:  Yes, and it is still needed ?Code Status:  full code ?Last date of  multidisciplinary goals of care discussion [per primary] ? ?12/27/2021: Met with family at bedside ? ?Labs   ?CBC: ?Recent Labs  ?Lab 12/26/21 ?1035 12/26/21 ?1218 12/26/21 ?1330 12/26/21 ?1944  12/27/21 ?0532  ?WBC  --   --  8.0 14.6* 9.1  ?HGB 10.9* 9.9* 9.0* 8.8* 7.8*  ?HCT 32.0* 29.0* 26.3* 26.4* 23.2*  ?MCV  --   --  99.2 99.2 92.8  ?PLT  --   --  76* 84* 82*  ? ? ? ?Basic Metabolic Panel: ?Recent Labs  ?Lab 12/26/21 ?1035 12/26/21 ?1218 12/26/21 ?1330 12/27/21 ?0532  ?NA 138 138 137 138  ?K 4.0 4.1 4.0 3.7  ?CL  --   --  109 105  ?CO2  --   --  20* 23  ?GLUCOSE  --   --  180* 133*  ?BUN  --   --  19 23  ?CREATININE  --   --  1.09 1.30*  ?CALCIUM  --   --  7.7* 7.9*  ?MG  --   --  1.7  --   ? ? ?GFR: ?Estimated Creatinine Clearance: 34.6 mL/min (A) (by C-G formula based on SCr of 1.3 mg/dL (H)). ?Recent Labs  ?Lab 12/26/21 ?1330 12/26/21 ?1944 12/27/21 ?0532  ?WBC 8.0 14.6* 9.1  ? ? ? ?Liver Function Tests: ?No results for input(s): AST, ALT, ALKPHOS, BILITOT, PROT, ALBUMIN in the last 168 hours. ?No results for input(s): LIPASE, AMYLASE in the last 168 hours. ?No results for input(s): AMMONIA in the last 168 hours. ? ?ABG ?   ?Component Value Date/Time  ? PHART 7.401 12/26/2021 1218  ? PCO2ART 39.7 12/26/2021 1218  ? PO2ART 212 (H) 12/26/2021 1218  ? HCO3 24.8 12/26/2021 1218  ? TCO2 26 12/26/2021 1218  ? O2SAT 100 12/26/2021 1218  ? ?  ? ?Coagulation Profile: ?Recent Labs  ?Lab 12/26/21 ?1330 12/26/21 ?1944 12/27/21 ?0532  ?INR 2.1* 1.8* 1.4*  ? ? ? ?Cardiac Enzymes: ?No results for input(s): CKTOTAL, CKMB, CKMBINDEX, TROPONINI in the last 168 hours. ? ?HbA1C: ?No results found for: HGBA1C ? ?CBG: ?No results for input(s): GLUCAP in the last 168 hours. ? ? ?Past Medical History:  ?He,  has a past medical history of AAA (abdominal aortic aneurysm) ([place]) and Hypertension.  ? ?Surgical History:  ? ?Past Surgical History:  ?Procedure Laterality Date  ? CARPAL TUNNEL RELEASE    ? EYE SURGERY    ?  ? ?Social History:  ? reports that he has never smoked. He has never used smokeless tobacco.  ? ?Family History:  ?His family history includes Gallbladder disease in his mother; Gastric cancer in his father.   ? ?Allergies ?No Known Allergies  ? ?Home Medications  ?Prior to Admission medications   ?Medication Sig Start Date End Date Taking? Authorizing Provider  ?Ascorbic Acid (VITAMIN C PO) Take 1 capsule by mouth daily.   Yes [provider]  ?Aspirin Buf,CaCarb-MgCarb-MgO, 81 MG TABS Take 81 mg by mouth as needed for pain.   Yes [provider]  ?enalapril (VASOTEC) 10 MG tablet Take 20 mg by mouth daily. 07/18/19  Yes [provider]  ?Melatonin 10 MG CAPS Take 10 mg by mouth at bedtime.   Yes [provider]  ?Multiple Vitamin (MULTIVITAMIN WITH MINERALS) TABS tablet Take 1 tablet by mouth daily.   Yes [provider]  ?simvastatin (ZOCOR) 20 MG tablet Take 20 mg by mouth at bedtime. 07/18/19  Yes [provider]  ?tamsulosin (FLOMAX) 0.4 MG CAPS  capsule Take 0.8 mg by mouth daily. 07/18/19  Yes [provider]  ?traZODone (DESYREL) 50 MG tablet Take 50 mg by mouth at bedtime as needed for sleep.   Yes [provider]  ?VITAMIN D PO Take 1 capsule by mouth daily.   Yes [provider]  ?amLODipine (NORVASC) 5 MG tablet Take 5 mg by mouth daily. ?Patient not taking: Reported on 12/18/2021 10/01/21   [provider]  ?dutasteride (AVODART) 0.5 MG capsule Take 0.5 mg by mouth daily. ?Patient not taking: Reported on 12/18/2021 01/15/20   [provider]  ?  ?This patient is critically ill with multiple organ system failure; which, requires frequent high complexity decision making, assessment, support, evaluation, and titration of therapies. This was completed through the application of advanced monitoring technologies and extensive interpretation of multiple databases. During this encounter critical care time was devoted to patient care services described in this note for 32 minutes. ? ?Garner Nash, DO ?[facility] ?12/27/2021 10:14 AM  ? ? ? ?

## 2021-12-27 NOTE — Progress Notes (Signed)
MRI reviewed. Given the severity of the cervical changes, I question whether we could be dealing with a falsely localizing cervical lesion exacerbated by extension during intubation rather than spinal cord ischemia as previously thought. I would recommend getting a neurosurgical opinion would be prudent given the severity of the stenosis.  ? ?Roland Rack, MD ?[facility] ?[phone] ? ?If 7pm- 7am, please page neurology on call as listed in [place]. ? ?

## 2021-12-27 NOTE — Evaluation (Signed)
Occupational Therapy Evaluation ?Patient Details ?Name: Allen Arias ?MRN: [id number] ?DOB: 1941/08/17 ?Today's Date: 12/27/2021 ? ? ?History of Present Illness Allen Arias is a 81 y.o. 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 Post-op he developed BLE dense weakness. 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  PMH includes HTN, carpal tunnel release, and eye sx.  ? ?Clinical Impression ?  ?Pt is typically independent in ADL and mobility. Enjoys going on walks with wife, drives. Today he presents with "manageable" groin pain at incision, daughter acting as translator at his request. He is pleasant and cooperative. BUE are moving [name] as line allow and he has 5/5 grasp in both hands.  Total A for LB dressing, set up/min A overall for UB ADL/grooming at this time from bed level or supported seated position. Mod A +2 for bed mobility to come and sit EOB. Requires at least min A to maintain sitting position with posterior lean. total A +2 for squat pivot to Pt's right via drop arm recliner (although Pt does assist with rocking and grasping on with BUE). At this time recommending Comprehensive inpatient rehab to maximize safety and independence in ADL and functional transfers to and return home with very supportive family. OT will continue to follow acutely.  ?   ? ?Recommendations for follow up therapy are one component of a multi-disciplinary discharge planning process, led by the attending physician.  Recommendations may be updated based on patient status, additional functional criteria and insurance authorization.  ? ?Follow Up Recommendations ? Acute inpatient rehab (3hours/day)  ?  ?Assistance Recommended at Discharge Frequent or constant  Supervision/Assistance  ?Patient can return home with the following Two people to help with walking and/or transfers;A lot of help with bathing/dressing/bathroom;Assistance with cooking/housework;Assistance with feeding;Assist for transportation;Help with stairs or ramp for entrance ? ?  ?Functional Status Assessment ? Patient has had a recent decline in their functional status and demonstrates the ability to make significant improvements in function in a reasonable and predictable amount of time.  ?Equipment Recommendations ? BSC/3in1;Tub/shower bench;Wheelchair (measurements OT);Wheelchair cushion (measurements OT);Other (comment) (defer to next venue of care)  ?  ?Recommendations for Other Services Rehab consult ? ? ?  ?Precautions / Restrictions Precautions ?Precautions: Fall ?Restrictions ?Weight Bearing Restrictions: No  ? ?  ? ?Mobility Bed Mobility ?Overal bed mobility: Needs Assistance ?Bed Mobility: Supine to Sit ?  ?  ?Supine to sit: Mod assist, +2 for physical assistance, +2 for safety/equipment, HOB elevated ?  ?  ?General bed mobility comments: assist with RLE to come to EOB, verbal instrcutions for sequencing bed mobility, mod A for trunk elevation and use of pad to bring hips EOB ?  ? ?Transfers ?Overall transfer level: Needs assistance ?Equipment used: 2 person hand held assist ?Transfers: Bed to chair/wheelchair/BSC ?  ?  ?Squat pivot transfers: Total assist, +2 physical assistance, +2 safety/equipment, Max assist ?  ?  ?  ?General transfer comment: use of bed pad and gait belt to create sling chair for max to total A +2 (Pt assisting with rocking motion and holding on with BUE) squat pivot to Pt's R with drop arm recliner ?  ? ?  ?Balance Overall balance assessment: Needs assistance ?Sitting-balance support: Bilateral upper extremity supported, Feet supported ?Sitting balance-Leahy Scale: Poor ?  Sitting balance - Comments: min to mod A for seated balance at edge of ICU bed ?Postural control:  Posterior lean ?  ?  ?  ?  ?  ?  ?  ?  ?  ?  ?  ?  ?  ?  ?   ? ?ADL either performed or assessed with clinical judgement  ? ?ADL Overall ADL's : Needs assistance/impaired ?Eating/Feeding: Set up;Sitting ?  ?Grooming: Minimal assistance;Sitting;Wash/dry face ?  ?Upper Body Bathing: Moderate assistance;Sitting ?Upper Body Bathing Details (indicate cue type and reason): fro back ?Lower Body Bathing: Maximal assistance ?  ?Upper Body Dressing : Moderate assistance ?  ?Lower Body Dressing: Maximal assistance ?Lower Body Dressing Details (indicate cue type and reason): able to lift L leg to assist with donning of socks ?Toilet Transfer: Total assistance;+2 for physical assistance;+2 for safety/equipment;Squat-pivot ?Toilet Transfer Details (indicate cue type and reason): simulated through recliner transfer ?Toileting- Clothing Manipulation and Hygiene: Maximal assistance ?  ?  ?  ?Functional mobility during ADLs: Total assistance;+2 for physical assistance;+2 for safety/equipment (2 person face to face transfer) ?General ADL Comments: decreased access to LB for ADL, assist for transfers  ? ? ? ?Vision Ability to See in Adequate Light: 0 Adequate ?Patient Visual Report: No change from baseline ?Vision Assessment?: No apparent visual deficits  ?   ?Perception   ?  ?Praxis   ?  ? ?Pertinent Vitals/Pain Pain Assessment ?Pain Assessment: Faces ?Faces Pain Scale: Hurts a little bit ?Pain Location: groin incision ?Pain Descriptors / Indicators: Discomfort, Grimacing, Sore ?Pain Intervention(s): Premedicated before session, Repositioned  ? ? ? ?Hand Dominance   ?  ?Extremity/Trunk Assessment Upper Extremity Assessment ?Upper Extremity Assessment: Overall WFL for tasks assessed;Generalized weakness (grasp 5/5 BUE, A line L side) ?  ?Lower Extremity Assessment ?Lower Extremity Assessment: Defer to PT evaluation;RLE deficits/detail;LLE deficits/detail ?  ?  ?  ?Communication Communication ?Communication: Prefers language other than  English ([place]) ?  ?Cognition Arousal/Alertness: Awake/alert ?Behavior During [facility]: [name] for tasks assessed/performed, Flat affect ?Overall Cognitive Status: Difficult to assess ?  ?  ?  ?  ?  ?  ?  ?  ?  ?  ?  ?  ?  ?  ?  ?  ?General Comments: daughter Carney BernJean acting as translator, Pt following commands appropriately, compliant and cooperative ?  ?  ?General Comments  Daughter Carney BernJean present and acting as intepreter as Pt requested. ? ?  ?Exercises   ?  ?Shoulder Instructions    ? ? ?Home Living Family/patient expects to be discharged to:: Private residence ?Living Arrangements: Spouse/significant other ?Available Help at Discharge: Family ?Type of Home: House (townhouse) ?Home Access: Level entry ?  ?  ?Home Layout: Two level ?Alternate Level Stairs-Number of Steps:  (flight) ?  ?Bathroom Shower/Tub: Tub/shower unit ?  ?  ?  ?  ?  ?  ?Additional Comments: Living room and kitchen are downstairs, bedroom upstairs ?  ? ?  ?Prior Functioning/Environment Prior Level of Function : Independent/Modified Independent ?  ?  ?  ?  ?  ?  ?Mobility Comments: Enjoys walking ?  ?  ? ?  ?  ?OT Problem List: Decreased strength;Decreased range of motion;Decreased activity tolerance;Impaired balance (sitting and/or standing);Decreased knowledge of use of DME or AE;Decreased knowledge of precautions;Increased edema ?  ?   ?OT Treatment/Interventions: Self-care/ADL training;Therapeutic exercise;Neuromuscular education;DME and/or AE instruction;Therapeutic activities;Patient/family education;Balance training  ?  ?OT Goals(Current goals can be found in the care plan section) Acute Rehab OT Goals ?  Patient Stated Goal: walk again ?OT Goal Formulation: With patient/family ?Time For Goal Achievement: 01/10/22 ?Potential to Achieve Goals: Good ?ADL Goals ?Pt Will Perform Grooming: with modified independence;sitting ?Pt Will Perform Upper Body Bathing: with modified independence;sitting ?Pt Will Perform Lower Body Bathing: with supervision;with  caregiver independent in assisting;with adaptive equipment;sitting/lateral leans ?Pt Will Perform Upper Body Dressing: with modified independence;sitting ?Pt Will Perform Lower Body Dressing: with min guard assist;with caregi

## 2021-12-27 NOTE — Anesthesia Postprocedure Evaluation (Signed)
Anesthesia Post Note ? ?Patient: Allen Arias ? ?Procedure(s) Performed: ABDOMINAL AORTIC ENDOVASCULAR STENT GRAFT (Groin) ?ULTRASOUND GUIDANCE FOR VASCULAR ACCESS (Bilateral: Groin) ?PATCH ANGIOPLASTY USING XENOSURE BIOLOGIC PATCH (1cmx6cm) (Bilateral: Groin) ?RIGHT ENDARTERECTOMY FEMORAL (Right: Groin) ?BILATERAL FEMORAL ARTERY THROMBECTOMY (Bilateral: Groin) ?LEFT ENDARTERECTOMY POPLITEAL WITH ANGIOPLASTY USING XENOSURE BIOPATCH (1cmX6cm) (Left: Leg Lower) ? ?  ? ?Patient location during evaluation: PACU ?Anesthesia Type: General ?Level of consciousness: sedated and patient cooperative ?Pain management: pain level controlled ?Vital Signs Assessment: post-procedure vital signs reviewed and stable ?Respiratory status: spontaneous breathing ?Cardiovascular status: stable ?Anesthetic complications: no ? ? ?No notable events documented. ? ?Last Vitals:  ?Vitals:  ? 12/27/21 0630 12/27/21 0700  ?BP: (!) 130/58 134/60  ?Pulse: (!) 104 (!) 101  ?Resp: 18 14  ?Temp:    ?SpO2: 96% 96%  ?  ?Last Pain:  ?Vitals:  ? 12/27/21 0446  ?TempSrc: Axillary  ?PainSc:   ? ? ?  ?  ?  ?  ?  ?  ? ?Lewie Loron ? ? ? ? ?

## 2021-12-27 NOTE — Evaluation (Addendum)
Physical Therapy Evaluation ?Patient Details ?Name: Allen Arias ?MRN: [id number] ?DOB: 09/16/1940 ?Today's Date: 12/27/2021 ? ?History of Present Illness ? Allen Arias is a 81 y.o. 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 Post-op he developed BLE dense weakness. 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  PMH includes HTN, carpal tunnel release, and eye sx.  ?Clinical Impression ? PTA, pt lives with his spouse and is independent; enjoys going on walks. Pt reporting good pain control and is motivated to get out of bed to the chair. Pt presents with acute RLE weakness; has flicker of quad activation, but otherwise no active movement noted. Left lower extremity with increased edema and grossly 2/5 muscle strength. Pt reports intact sensation throughout. Pt requiring two person moderate assist for bed mobility and two person maximal assist for squat pivot transfers from bed to chair. HR 103-115 bpm, SpO2 96% on RA. Highly recommend post acute rehab to address deficits and maximize functional mobility.   ? ?Recommendations for follow up therapy are one component of a multi-disciplinary discharge planning process, led by the attending physician.  Recommendations may be updated based on patient status, additional functional criteria and insurance authorization. ? ?Follow Up Recommendations Acute inpatient rehab (3hours/day) ? ?  ?Assistance Recommended at Discharge Frequent or constant Supervision/Assistance  ?Patient can return home with the following ? Two people to help with walking and/or transfers;A lot of help with bathing/dressing/bathroom ? ?  ?Equipment Recommendations Wheelchair (measurements PT);Wheelchair cushion (measurements PT);BSC/3in1   ?Recommendations for Other Services ? Rehab consult  ?  ?Functional Status Assessment Patient has had a recent decline in their functional status and demonstrates the ability to make significant improvements in function in a reasonable and predictable amount of time.  ? ?  ?Precautions / Restrictions Precautions ?Precautions: Fall ?Restrictions ?Weight Bearing Restrictions: No  ? ?  ? ?Mobility ? Bed Mobility ?Overal bed mobility: Needs Assistance ?Bed Mobility: Supine to Sit ?  ?  ?Supine to sit: Mod assist, +2 for physical assistance, +2 for safety/equipment, HOB elevated ?  ?  ?General bed mobility comments: assist with RLE to come to EOB, verbal instrcutions for sequencing bed mobility, mod A for trunk elevation and use of pad to bring hips EOB ?  ? ?Transfers ?Overall transfer level: Needs assistance ?Equipment used: 2 person hand held assist ?Transfers: Bed to chair/wheelchair/BSC ?  ?  ?  ?Squat pivot transfers: +2 physical assistance, +2 safety/equipment, Max assist ?  ?  ?General transfer comment: use of bed pad and gait belt to create sling chair for max to total A +2 (Pt assisting with rocking motion and holding on with BUE) squat pivot to Pt's R with drop arm recliner ?  ? ?Ambulation/Gait ?  ?  ?  ?  ?  ?  ?  ?  ? ?Stairs ?  ?  ?  ?  ?  ? ?Wheelchair Mobility ?  ? ?Modified Rankin (Stroke Patients Only) ?  ? ?  ? ?Balance Overall balance assessment: Needs assistance ?Sitting-balance support: Bilateral upper extremity supported, Feet supported ?Sitting balance-Leahy Scale: Poor ?Sitting balance - Comments: min to mod A for seated balance at edge of ICU bed ?Postural control: Posterior lean ?  ?  ?  ?  ?  ?  ?  ?  ?  ?  ?  ?  ?  ?  ?   ? ? ? ?  Pertinent Vitals/Pain Pain Assessment ?Pain Assessment: Faces ?Faces Pain Scale: Hurts a little bit ?Pain Location: groin incision ?Pain Descriptors / Indicators: Discomfort, Grimacing, Sore ?Pain Intervention(s): Limited activity within patient's tolerance,  Monitored during session, Premedicated before session  ? ? ?Home Living Family/patient expects to be discharged to:: Private residence ?Living Arrangements: Spouse/significant other ?Available Help at Discharge: Family ?Type of Home: House (townhouse) ?Home Access: Level entry ?  ?  ?Alternate Level Stairs-Number of Steps:  (flight) ?Home Layout: Two level ?  ?Additional Comments: Living room and kitchen are downstairs, bedroom upstairs  ?  ?Prior Function Prior Level of Function : Independent/Modified Independent ?  ?  ?  ?  ?  ?  ?Mobility Comments: Enjoys walking ?  ?  ? ? ?Hand Dominance  ?   ? ?  ?Extremity/Trunk Assessment  ? Upper Extremity Assessment ?Upper Extremity Assessment: Defer to OT evaluation ?  ? ?Lower Extremity Assessment ?Lower Extremity Assessment: RLE deficits/detail;LLE deficits/detail ?RLE Deficits / Details: Flicker of quad activation with quad set, otherwise no active movement noted. ankle dorsiflexion ROM WFL ?LLE Deficits / Details: Increased edema. Grossly 2/5 ?  ? ?Cervical / Trunk Assessment ?Cervical / Trunk Assessment: Normal  ?Communication  ? Communication: Prefers language other than English ([place])  ?Cognition Arousal/Alertness: Awake/alert ?Behavior During Therapy: [facility] for tasks assessed/performed, Flat affect ?Overall Cognitive Status: Difficult to assess ?  ?  ?  ?  ?  ?  ?  ?  ?  ?  ?  ?  ?  ?  ?  ?  ?General Comments: daughter Carney Bern acting as Nurse, learning disability, Pt following commands appropriately, compliant and cooperative ?  ?  ? ?  ?General Comments General comments (skin integrity, edema, etc.): Daughter Carney Bern present and acting as intepreter as Pt requested. ? ?  ?Exercises    ? ?Assessment/Plan  ?  ?PT Assessment Patient needs continued PT services  ?PT Problem List Decreased strength;Decreased activity tolerance;Decreased balance;Decreased mobility ? ?   ?  ?PT Treatment Interventions DME instruction;Gait training;Functional mobility training;Balance training;Therapeutic  activities;Therapeutic exercise;Patient/family education;Wheelchair mobility training   ? ?PT Goals (Current goals can be found in the Care Plan section)  ?Acute Rehab PT Goals ?Patient Stated Goal: to get to chair ?PT Goal Formulation: With patient/family ?Time For Goal Achievement: 01/10/22 ?Potential to Achieve Goals: Good ? ?  ?Frequency Min 4X/week ?  ? ? ?Co-evaluation PT/OT/SLP Co-Evaluation/Treatment: Yes ?Reason for Co-Treatment: Complexity of the patient's impairments (multi-system involvement);For patient/therapist safety;To address functional/ADL transfers ?PT goals addressed during session: Mobility/safety with mobility ?OT goals addressed during session: ADL's and self-care;Strengthening/ROM ?  ? ? ?  ?AM-PAC PT "6 Clicks" Mobility  ?Outcome Measure Help needed turning from your back to your side while in a flat bed without using bedrails?: A Lot ?Help needed moving from lying on your back to sitting on the side of a flat bed without using bedrails?: A Lot ?Help needed moving to and from a bed to a chair (including a wheelchair)?: Total ?Help needed standing up from a chair using your arms (e.g., wheelchair or bedside chair)?: Total ?Help needed to walk in hospital room?: Total ?Help needed climbing 3-5 steps with a railing? : Total ?6 Click Score: 8 ? ?  ?End of Session Equipment Utilized During Treatment: Gait belt ?Activity Tolerance: Patient tolerated treatment well ?Patient left: in chair;with call bell/phone within reach;with family/visitor present ?Nurse Communication: Mobility status ?PT Visit Diagnosis: Other abnormalities of gait and mobility (R26.89);Difficulty in walking, not elsewhere classified (R26.2) ?  ? ?  Time: [id number] ?PT Time Calculation (min) (ACUTE ONLY): 26 min ? ? ?Charges:   PT Evaluation ?$PT Eval Moderate Complexity: 1 Mod ?  ?  ?   ? ? ?Lillia Paulsaroline Brown, PT, DPT ?Acute Rehabilitation Services ?Pager [phone] ?Office [phone] ? ? ?Carloine Ernestina Penna Brown ?12/27/2021, 11:46  AM ? ?

## 2021-12-27 NOTE — Progress Notes (Signed)
[facility] [place] ?PROGRESS NOTE ? ?ASSESSMENT / PLAN: ?Allen Arias is a 81 y.o. male s/p EVAR, bilateral common femoral thromboendarterectomy; left popliteal / TP trunk thromboendarterectomy 12/26/21 complicated by postoperative lower extremity weakness. ? ?He has no evidence of spinal cord ischemia or stroke on MRI.  He has significant cervical spinal stenosis.  Cervical manipulation during intubation may be the culprit of his symptoms.  I greatly appreciate neurology and neurosurgical services input.  We will continue supportive care.  We can discontinue maneuvers to increase cerebral perfusion pressure.  Stop vasopressor therapy.  No further need for transfusion.  Monitor CBC.  Stop IV fluids.  PT/OT therapy.  Okay for stepdown. ? ?SUBJECTIVE: ?Discussed case and postoperative problems with the help of a translator service.  The patient reports his lower extremity weakness is slightly improved.  Neurology and neurosurgery teams have evaluated the patient.  I greatly appreciate their help. ? ?OBJECTIVE: ?BP 102/60   Pulse 96   Temp 97.6 ?F (36.4 ?C) (Oral)   Resp 12   Ht 5\' 4"  (1.626 m)   Wt 52.2 kg   SpO2 97%   BMI 19.74 kg/m?  ? ?Intake/Output Summary (Last 24 hours) at 12/27/2021 1225 ?Last data filed at 12/27/2021 1000 ?Gross per 24 hour  ?Intake 4495.02 ml  ?Output 1120 ml  ?Net 3375.02 ml  ?  ?No acute distress ?Regular rate and rhythm ?Unlabored breathing ?Soft nontender abdomen ?Groin incisions healing appropriately without hematoma ?Left calf swollen without evidence of compartment syndrome.  Brisk Doppler flow in posterior tibial arteries bilaterally. ? ? ?  Latest Ref Rng & Units 12/27/2021  ?  5:32 AM 12/26/2021  ?  7:44 PM 12/26/2021  ?  1:30 PM  ?CBC  ?WBC 4.0 - 10.5 K/uL 9.1   14.6   8.0    ?Hemoglobin 13.0 - 17.0 g/dL 7.8   8.8   9.0    ?Hematocrit 39.0 - 52.0 % 23.2   26.4   26.3    ?Platelets 150 - 400 K/uL 82   84   76    ?  ? ? ?  Latest Ref Rng & Units 12/27/2021  ?   5:32 AM 12/26/2021  ?  1:30 PM 12/26/2021  ? 12:18 PM  ?CMP  ?Glucose 70 - 99 mg/dL 12/28/2021   756     ?BUN 8 - 23 mg/dL 23   19     ?Creatinine 0.61 - 1.24 mg/dL 433   2.95     ?Sodium 135 - 145 mmol/L 138   137   138    ?Potassium 3.5 - 5.1 mmol/L 3.7   4.0   4.1    ?Chloride 98 - 111 mmol/L 105   109     ?CO2 22 - 32 mmol/L 23   20     ?Calcium 8.9 - 10.3 mg/dL 7.9   7.7     ? ? ?Estimated Creatinine Clearance: 34.6 mL/min (A) (by C-G formula based on SCr of 1.3 mg/dL (H)). ? ?1.88. Rande Brunt, MD ?[facility] [place] ?Office Phone Number: [phone] ?12/27/2021 12:25 PM ? ? ? ?

## 2021-12-28 ENCOUNTER — Inpatient Hospital Stay (HOSPITAL_COMMUNITY): Payer: Medicare Other

## 2021-12-28 LAB — CBC
HCT: 19.6 % — ABNORMAL LOW (ref 39.0–52.0)
HCT: 31 % — ABNORMAL LOW (ref 39.0–52.0)
Hemoglobin: 6.7 g/dL — CL (ref 13.0–17.0)
Hemoglobin: 10.7 g/dL — ABNORMAL LOW (ref 13.0–17.0)
MCH: 31.9 pg (ref 26.0–34.0)
MCH: 30.6 pg (ref 26.0–34.0)
MCHC: 34.2 g/dL (ref 30.0–36.0)
MCHC: 34.5 g/dL (ref 30.0–36.0)
MCV: 93.3 fL (ref 80.0–100.0)
MCV: 88.6 fL (ref 80.0–100.0)
Platelets: 55 10*3/uL — ABNORMAL LOW (ref 150–400)
Platelets: 55 10*3/uL — ABNORMAL LOW (ref 150–400)
RBC: 2.1 MIL/uL — ABNORMAL LOW (ref 4.22–5.81)
RBC: 3.5 MIL/uL — ABNORMAL LOW (ref 4.22–5.81)
RDW: 16 % — ABNORMAL HIGH (ref 11.5–15.5)
RDW: 17.2 % — ABNORMAL HIGH (ref 11.5–15.5)
WBC: 6.3 10*3/uL (ref 4.0–10.5)
WBC: 8.6 10*3/uL (ref 4.0–10.5)
nRBC: 0 % (ref 0.0–0.2)
nRBC: 0 % (ref 0.0–0.2)

## 2021-12-28 LAB — CBC WITH DIFFERENTIAL/PLATELET
Abs Immature Granulocytes: 0.03 10*3/uL (ref 0.00–0.07)
Basophils Absolute: 0 10*3/uL (ref 0.0–0.1)
Basophils Relative: 0 %
Eosinophils Absolute: 0 10*3/uL (ref 0.0–0.5)
Eosinophils Relative: 0 %
HCT: 17.9 % — ABNORMAL LOW (ref 39.0–52.0)
Hemoglobin: 5.7 g/dL — CL (ref 13.0–17.0)
Immature Granulocytes: 1 %
Lymphocytes Relative: 6 %
Lymphs Abs: 0.3 10*3/uL — ABNORMAL LOW (ref 0.7–4.0)
MCH: 30.3 pg (ref 26.0–34.0)
MCHC: 31.8 g/dL (ref 30.0–36.0)
MCV: 95.2 fL (ref 80.0–100.0)
Monocytes Absolute: 0.5 10*3/uL (ref 0.1–1.0)
Monocytes Relative: 9 %
Neutro Abs: 5 10*3/uL (ref 1.7–7.7)
Neutrophils Relative %: 84 %
Platelets: 44 10*3/uL — ABNORMAL LOW (ref 150–400)
RBC: 1.88 MIL/uL — ABNORMAL LOW (ref 4.22–5.81)
RDW: 17.5 % — ABNORMAL HIGH (ref 11.5–15.5)
WBC: 5.9 10*3/uL (ref 4.0–10.5)
nRBC: 0 % (ref 0.0–0.2)

## 2021-12-28 LAB — BASIC METABOLIC PANEL
Anion gap: 6 (ref 5–15)
BUN: 22 mg/dL (ref 8–23)
CO2: 25 mmol/L (ref 22–32)
Calcium: 7.9 mg/dL — ABNORMAL LOW (ref 8.9–10.3)
Chloride: 107 mmol/L (ref 98–111)
Creatinine, Ser: 1.1 mg/dL (ref 0.61–1.24)
GFR, Estimated: >60 mL/min (ref >60)
Glucose, Bld: 91 mg/dL (ref 70–99)
Potassium: 3.8 mmol/L (ref 3.5–5.1)
Sodium: 138 mmol/L (ref 135–145)

## 2021-12-28 LAB — PREPARE RBC (CROSSMATCH)

## 2021-12-28 MED ORDER — SODIUM CHLORIDE 0.9% FLUSH: 3.0000 mL | INTRAVENOUS | Status: DC | PRN

## 2021-12-28 MED ORDER — SODIUM CHLORIDE 0.9 % IV SOLN
250.0000 mL | INTRAVENOUS | Status: DC | PRN
  Administered 2021-12-28: 250 mL via INTRAVENOUS

## 2021-12-28 MED ORDER — SODIUM CHLORIDE 0.9% IV SOLUTION: Freq: Once | INTRAVENOUS | Status: DC

## 2021-12-28 MED ORDER — SODIUM CHLORIDE 0.9% FLUSH
3.0000 mL | Freq: Two times a day (BID) | INTRAVENOUS | Status: DC
  Administered 2021-12-28 – 2021-12-31 (×6): 3 mL via INTRAVENOUS

## 2021-12-28 MED ORDER — IOHEXOL 350 MG/ML SOLN
100.0000 mL | Freq: Once | INTRAVENOUS | Status: AC | PRN
Start: 2021-12-28 — End: 2021-12-28
  Administered 2021-12-28: 100 mL via INTRAVENOUS

## 2021-12-28 NOTE — Progress Notes (Signed)
[facility] [place] ?PROGRESS NOTE ? ?ASSESSMENT / PLAN: ?Allen Arias is a 81 y.o. male s/p EVAR, bilateral common femoral thromboendarterectomy; left popliteal / TP trunk thromboendarterectomy 12/26/21 complicated by postoperative lower extremity weakness. ? ?He has no evidence of spinal cord ischemia or stroke on MRI.  He has significant cervical spinal stenosis.  Cervical manipulation during intubation may be the culprit of his symptoms.  I greatly appreciate neurology and neurosurgical services input.  We will continue supportive care.  ? ?S/P 1u PRBC. His repeat CBC shows a lower hgb and PLT count. He appears to be bleeding, but I see no external signs (eg groin hematoma or calf hematoma). Plan CT angiogram with lower extremity runoff to assess for RP hematoma, occult pseudoaneurysm, etc. Transfuse 2u PRBC STAT. Will check in serially throughout the day.  ? ?SUBJECTIVE: ?Reviewed operative details with patients daughter.  ? ?OBJECTIVE: ?BP 129/72   Pulse 92   Temp 98.1 ?F (36.7 ?C) (Oral)   Resp (!) 22   Ht 5\' 4"  (1.626 m)   Wt 52.2 kg   SpO2 95%   BMI 19.74 kg/m?  ? ?Intake/Output Summary (Last 24 hours) at 12/28/2021 0943 ?Last data filed at 12/28/2021 0800 ?Gross per 24 hour  ?Intake 810 ml  ?Output 925 ml  ?Net -115 ml  ? ?  ?No acute distress ?Regular rate and rhythm ?Unlabored breathing ?Soft nontender abdomen ?Groin incisions healing appropriately without hematoma ?Left calf swelling improved.  Brisk Doppler flow in posterior tibial arteries bilaterally. ? ? ?  Latest Ref Rng & Units 12/28/2021  ?  3:54 AM 12/27/2021  ?  5:32 AM 12/26/2021  ?  7:44 PM  ?CBC  ?WBC 4.0 - 10.5 K/uL 6.3   9.1   14.6    ?Hemoglobin 13.0 - 17.0 g/dL 6.7   7.8   8.8    ?Hematocrit 39.0 - 52.0 % 19.6   23.2   26.4    ?Platelets 150 - 400 K/uL 55   82   84    ?  ? ? ?  Latest Ref Rng & Units 12/28/2021  ?  3:54 AM 12/27/2021  ?  5:32 AM 12/26/2021  ?  1:30 PM  ?CMP  ?Glucose 70 - 99 mg/dL 91   12/28/2021   706     ?BUN 8 - 23 mg/dL 22   23   19     ?Creatinine 0.61 - 1.24 mg/dL 237     6.28    ?Sodium 135 - 145 mmol/L 138   138   137    ?Potassium 3.5 - 5.1 mmol/L 3.8   3.7   4.0    ?Chloride 98 - 111 mmol/L 107   105   109    ?CO2 22 - 32 mmol/L 25   23   20     ?Calcium 8.9 - 10.3 mg/dL 7.9   7.9   7.7    ? ? ?Estimated Creatinine Clearance: 40.9 mL/min (by C-G formula based on SCr of 1.1 mg/dL). ? ?3.15. 1.76, MD ?[facility] [place] ?Office Phone Number: [phone] ?12/28/2021 9:43 AM ? ? ? ?

## 2021-12-28 NOTE — Progress Notes (Signed)
No new events or problems overnight.  Patient is somewhat more listless and lethargic today.  He denies any pain.  He still reports normal sensation in both upper and lower extremities.  He still has minimal voluntary movement of his left lower extremity and no movement of his right lower extremity.  He continues to have good strength in both upper extremities without evidence of deficit. ? ?He is afebrile.  His urine output is good.  Follow-up labs however show significant worsening of his edema worrisome for ongoing hemorrhage.  This is being evaluated by vascular surgery. ? ?Patient has significant bilateral lower extremity weakness without sensory loss.  He has evidence of significant cervical spondylosis and stenosis with ongoing cord compression worst at C6-7 level also present at C4-5 and C5-6.  His current symptoms and examination are somewhat inconsistent with cervical spinal cord injury as a mechanism for his current symptoms but this is the only potential cause identified at this point.  Recommend continuing observation and stabilization with regard to his possible ongoing hemorrhage.  I will continue to follow.  Once patient has stabilized from a medical surgical standpoint I think we could talk about surgical cervical decompression and fusion later in the week. ?

## 2021-12-28 NOTE — Progress Notes (Signed)
PCCM: ? ?Patient no longer requiring pressors.  ?Critical care will sign off at this time.  ?Please call us if we can help in anyway  ?Thanks for the consultation  ? ?Josephine Igo, DO ?[facility] ?12/28/2021 10:06 AM   ? ?

## 2021-12-28 NOTE — Progress Notes (Signed)
eLink Physician-Brief Progress Note ?Patient Name: Allen Arias ?DOB: 08-17-1941 ?MRN: [id number] ? ? ?Date of Service ? 12/28/2021  ?HPI/Events of Note ? hgb 6.7  no obvius signs of bleeding.  ?  ?eICU Interventions ? 1 unit RBC transfusion ordered ?RN to verify consent   ? ? ? ?Intervention Category ?Intermediate Interventions: Bleeding - evaluation and treatment with blood products ? ?Sharif Rendell G Beronica Lansdale ?12/28/2021, 5:03 AM ?

## 2021-12-28 NOTE — TOC Initial Note (Signed)
Transition of Care (TOC) - Initial/Assessment Note  ? ? ?Patient Details  ?Name: Allen Arias ?MRN: [id number] ?Date of Birth: 12/28/1940 ? ?Transition of Care (TOC) CM/SW Contact:    ?Bess Kinds, RN ?Phone Number: [phone] ?12/28/2021, 5:15 PM ? ?Clinical Narrative:                 ? ?Spoke with patient and daughter at the bedside. No interpreter used, however, interpreter was offered to daughter. Daughter has questions about caregivers. Patient does have Medicaid and could access PCS benefits, however, this process takes time and patient needs a plan for home in order to be eligible for CIR. Discussed talking to DSS concerning available services including transportation. Patient and spouse do receive food benefits. Patient was independent with no mobility problems. Additional resources provided for [employer] for [employer]. Discussed that skilled services and DME can be arranged, but skilled services are separate from custodial services.  ? ?Expected Discharge Plan: IP Rehab Facility ?Barriers to Discharge: Continued Medical Work up ? ? ?Patient Goals and CMS Choice ?Patient states their goals for this hospitalization and ongoing recovery are:: return home with wife ?CMS Medicare.gov Compare Post Acute Care list provided to:: Patient ?Choice offered to / list presented to : Adult Children ? ?Expected Discharge Plan and Services ?Expected Discharge Plan: IP Rehab Facility ?In-house Referral: Clinical Social Work ?Discharge Planning Services: CM Consult ?  ?  ?                ?  ?  ?  ?  ?  ?  ?  ?  ?  ?  ? ?Prior Living Arrangements/Services ?  ?Lives with:: Self, Spouse ?Patient language and need for interpreter reviewed:: Yes ?       ?Need for Family Participation in Patient Care: Yes (Comment) ?Care giver support system in place?: No (comment) ?  ?Criminal Activity/Legal Involvement Pertinent to Current Situation/Hospitalization: No - Comment as needed ? ?Activities of Daily Living ?  ?  ? ?Permission  Sought/Granted ?  ?  ?   ?   ?   ?   ? ?Emotional Assessment ?Appearance:: Appears stated age ?  ?  ?  ?Alcohol / Substance Use: Not Applicable ?Psych Involvement: No (comment) ? ?Admission diagnosis:  AAA (abdominal aortic aneurysm) (HCC) [I71.40] ?Abdominal aortic aneurysm (AAA) greater than 5.5 cm in diameter in male [facility]) [I71.40] ?Patient Active Problem List  ? Diagnosis Date Noted  ? AAA (abdominal aortic aneurysm) (HCC) 12/26/2021  ? Abdominal aortic aneurysm (AAA) greater than 5.5 cm in diameter in male [facility]) 12/26/2021  ? Status post endovascular aneurysm repair (EVAR)   ? Weakness of both lower extremities   ? Hyponatremia 02/03/2020  ? Hyperbilirubinemia 02/03/2020  ? Essential hypertension 02/03/2020  ? Purpura (HCC) 02/03/2020  ? Dehydration 02/03/2020  ? ?PCP:  [name], MD ?Pharmacy:   ?[id number] - [place] - [address] ?[address] ?[place] [address] ?Phone: [phone] Fax: [phone] ? ? ? ? ?Social Determinants of Health (SDOH) Interventions ?  ? ?Readmission Risk Interventions ?   ? View : No data to display.  ?  ?  ?  ? ? ? ?

## 2021-12-28 NOTE — Progress Notes (Signed)
Inpatient Rehab Admissions: ? ?Inpatient Rehab Consult received.  I met with patient and daughter at the bedside for rehabilitation assessment and to discuss goals and expectations of an inpatient rehab admission.  Spoke mainly with daughter. She interpreted for the pt. Daughter acknowledged understanding of CIR goals and expectations. Daughter is interested in pt pursuing CIR. Daughter informed AC that she would be able to provide intermittent (mainly nighttime) assistance to patient after discharge. She asked if information could be provided on how to setup assistance after discharge. TOC made aware. Note pt may have a pending surgery this week. Will continue to follow medical workup and progress with therapies. ? ?Signed: ?Lauren Graves Madden, MS, CCC-SLP ?Admissions Coordinator ?[phone] ? ? ?

## 2021-12-29 ENCOUNTER — Encounter (HOSPITAL_COMMUNITY): Payer: Self-pay | Admitting: Vascular Surgery

## 2021-12-29 LAB — CBC
HCT: 29.7 % — ABNORMAL LOW (ref 39.0–52.0)
Hemoglobin: 10 g/dL — ABNORMAL LOW (ref 13.0–17.0)
MCH: 30.3 pg (ref 26.0–34.0)
MCHC: 33.7 g/dL (ref 30.0–36.0)
MCV: 90 fL (ref 80.0–100.0)
Platelets: 56 10*3/uL — ABNORMAL LOW (ref 150–400)
RBC: 3.3 MIL/uL — ABNORMAL LOW (ref 4.22–5.81)
RDW: 17.8 % — ABNORMAL HIGH (ref 11.5–15.5)
WBC: 9.5 10*3/uL (ref 4.0–10.5)
nRBC: 0 % (ref 0.0–0.2)

## 2021-12-29 LAB — BPAM RBC
Blood Product Expiration Date: 202305052359
Blood Product Expiration Date: 202305092359
Blood Product Expiration Date: 202305102359
Blood Product Expiration Date: 202305122359
ISSUE DATE / TIME: 202304220244
ISSUE DATE / TIME: 202304230536
ISSUE DATE / TIME: 202304231052
ISSUE DATE / TIME: 202304231052
Unit Type and Rh: 6200
Unit Type and Rh: 6200
Unit Type and Rh: 8400
Unit Type and Rh: 8400

## 2021-12-29 LAB — TYPE AND SCREEN
ABO/RH(D): AB POS
Antibody Screen: NEGATIVE
Unit division: 0
Unit division: 0
Unit division: 0
Unit division: 0

## 2021-12-29 LAB — BASIC METABOLIC PANEL
Anion gap: 6 (ref 5–15)
BUN: 21 mg/dL (ref 8–23)
CO2: 22 mmol/L (ref 22–32)
Calcium: 7.8 mg/dL — ABNORMAL LOW (ref 8.9–10.3)
Chloride: 107 mmol/L (ref 98–111)
Creatinine, Ser: 0.88 mg/dL (ref 0.61–1.24)
GFR, Estimated: >60 mL/min (ref >60)
Glucose, Bld: 104 mg/dL — ABNORMAL HIGH (ref 70–99)
Potassium: 3.9 mmol/L (ref 3.5–5.1)
Sodium: 135 mmol/L (ref 135–145)

## 2021-12-29 LAB — HEPARIN INDUCED PLATELET AB (HIT ANTIBODY): Heparin Induced Plt Ab: 0.062 OD (ref 0.000–0.400)

## 2021-12-29 NOTE — Progress Notes (Signed)
Physical Therapy Treatment ?Patient Details ?Name: Allen Arias ?MRN: [id number] ?DOB: 1941-05-16 ?Today's Date: 12/29/2021 ? ? ?History of Present Illness Allen Arias is a 81 y.o. 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 Post-op he developed BLE dense weakness. 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  PMH includes HTN, carpal tunnel release, and eye sx. ? ?  ?PT Comments  ? ? Pt with mildly improved RLE strength, now exhibiting flicker of activation in quad, hamstring, and hip adductors, however, still no active movement in anterior tibialis. Focus on BLE AAROM/PROM exercises, bed mobility and transfer training. Utilized Stedy to promote bilateral lower extremity weightbearing, however, minimal weight through legs accepted. Pt remains an excellent candidate for AIR based on motivation, family support, and PLOF. Pt spouse and son present today for session.  ?  ?Recommendations for follow up therapy are one component of a multi-disciplinary discharge planning process, led by the attending physician.  Recommendations may be updated based on patient status, additional functional criteria and insurance authorization. ? ?Follow Up Recommendations ? Acute inpatient rehab (3hours/day) ?  ?  ?Assistance Recommended at Discharge Frequent or constant Supervision/Assistance  ?Patient can return home with the following Two people to help with walking and/or transfers;A lot of help with bathing/dressing/bathroom ?  ?Equipment Recommendations ? Wheelchair (measurements PT);Wheelchair cushion (measurements PT);BSC/3in1  ?  ?Recommendations for Other Services Rehab consult ? ? ?  ?Precautions / Restrictions Precautions ?Precautions: Fall ?Restrictions ?Weight Bearing  Restrictions: No  ?  ? ?Mobility ? Bed Mobility ?Overal bed mobility: Needs Assistance ?Bed Mobility: Supine to Sit ?  ?  ?Supine to sit: Mod assist, +2 for physical assistance, +2 for safety/equipment, HOB elevated ?  ?  ?General bed mobility comments: Assist for BLE's off edge of bed, cues for reaching with RUE for bed rail, assist for trunk elevation to upright ?  ? ?Transfers ?Overall transfer level: Needs assistance ?Equipment used: Ambulation equipment used ?Transfers: Bed to chair/wheelchair/BSC, Sit to/from Stand ?Sit to Stand: Mod assist, Max assist, +2 physical assistance ?  ?  ?  ?  ?  ?General transfer comment: Karleen Dolphin to transfer from bed to chair. Pt able to perform x3 sit to stands, max multimodal cues for pulling with BUE's and weightbearing through BLE's. Tactile facilitation to prevent LLE external rotation and for input for R quad activation ?Transfer via Lift Equipment: Stedy ? ?Ambulation/Gait ?  ?  ?  ?  ?  ?  ?  ?  ? ? ?Stairs ?  ?  ?  ?  ?  ? ? ?Wheelchair Mobility ?  ? ?Modified Rankin (Stroke Patients Only) ?  ? ? ?  ?Balance Overall balance assessment: Needs assistance ?Sitting-balance support: Bilateral upper extremity supported, Feet supported ?Sitting balance-Leahy Scale: Poor ?Sitting balance - Comments: requiring min guard-minA, cues for hand placement ?Postural control: Posterior lean ?  ?  ?  ?  ?  ?  ?  ?  ?  ?  ?  ?  ?  ?  ?  ? ?  ?Cognition Arousal/Alertness: Awake/alert ?Behavior During Therapy: [facility] for tasks assessed/performed, Flat affect ?Overall Cognitive Status: Difficult to assess ?  ?  ?  ?  ?  ?  ?  ?  ?  ?  ?  ?  ?  ?  ?  ?  ?  General Comments: Utilized Stratus interpreter, pt seems mildly fearful of falling ?  ?  ? ?  ?Exercises General Exercises - Lower Extremity ?Long Arc Quad: AAROM, Both, Seated, 10 reps ?Low Level/ICU Exercises ?Ankle Circles/Pumps: PROM, Right, 10 reps, Supine ?Hip ABduction/ADduction: AAROM, Both, Seated, Other (comment) (7 reps) ?Heel  Slides: AAROM, Both, 5 reps, Seated ? ?  ?General Comments  HR 105-120's ?  ?  ? ?Pertinent Vitals/Pain Pain Assessment ?Pain Assessment: Faces ?Faces Pain Scale: Hurts a little bit ?Pain Location: groin incision, buttocks ?Pain Descriptors / Indicators: Discomfort, Grimacing, Sore ?Pain Intervention(s): Monitored during session  ? ? ?Home Living   ?  ?  ?  ?  ?  ?  ?  ?  ?  ?   ?  ?Prior Function    ?  ?  ?   ? ?PT Goals (current goals can now be found in the care plan section) Acute Rehab PT Goals ?Patient Stated Goal: to get to chair ?PT Goal Formulation: With patient/family ?Time For Goal Achievement: 01/10/22 ?Potential to Achieve Goals: Good ?Progress towards PT goals: Progressing toward goals ? ?  ?Frequency ? ? ? Min 4X/week ? ? ? ?  ?PT Plan Current plan remains appropriate  ? ? ?Co-evaluation PT/OT/SLP Co-Evaluation/Treatment: Yes ?Reason for Co-Treatment: Complexity of the patient's impairments (multi-system involvement);For patient/therapist safety;To address functional/ADL transfers ?PT goals addressed during session: Mobility/safety with mobility ?  ?  ? ?  ?AM-PAC PT "6 Clicks" Mobility   ?Outcome Measure ? Help needed turning from your back to your side while in a flat bed without using bedrails?: A Lot ?Help needed moving from lying on your back to sitting on the side of a flat bed without using bedrails?: A Lot ?Help needed moving to and from a bed to a chair (including a wheelchair)?: Total ?Help needed standing up from a chair using your arms (e.g., wheelchair or bedside chair)?: Total ?Help needed to walk in hospital room?: Total ?Help needed climbing 3-5 steps with a railing? : Total ?6 Click Score: 8 ? ?  ?End of Session Equipment Utilized During Treatment: Gait belt ?Activity Tolerance: Patient tolerated treatment well ?Patient left: in chair;with call bell/phone within reach;with family/visitor present ?Nurse Communication: Mobility status ?PT Visit Diagnosis: Other abnormalities of gait and  mobility (R26.89);Difficulty in walking, not elsewhere classified (R26.2) ?  ? ? ?Time: [phone] ?PT Time Calculation (min) (ACUTE ONLY): 33 min ? ?Charges:  $Therapeutic Activity: 8-22 mins          ?          ? ?Lillia Pauls, PT, DPT ?Acute Rehabilitation Services ?Pager [phone] ?Office [phone] ? ? ? ?Carloine Ernestina Penna ?12/29/2021, 9:06 AM ? ?

## 2021-12-29 NOTE — TOC Progression Note (Signed)
Transition of Care (TOC) - Progression Note  ? ? ?Patient Details  ?Name: Allen Arias ?MRN: [id number] ?Date of Birth: 01-01-1941 ? ?Transition of Care (TOC) CM/SW Contact  ?Bess Kinds, RN ?Phone Number: [phone] ?12/29/2021, 7:22 AM ? ?Clinical Narrative:    ? ?Frances Furbish accepted referral for [facility] services and will assist with setting up of PCS services.  ? ?Expected Discharge Plan: IP Rehab Facility ?Barriers to Discharge: Continued Medical Work up ? ?Expected Discharge Plan and Services ?Expected Discharge Plan: IP Rehab Facility ?In-house Referral: Clinical Social Work ?Discharge Planning Services: CM Consult ?  ?  ?                ?  ?  ?  ?  ?  ?  ?  ?  ?  ?  ? ? ?Social Determinants of Health (SDOH) Interventions ?  ? ?Readmission Risk Interventions ?   ? View : No data to display.  ?  ?  ?  ? ? ?

## 2021-12-29 NOTE — Progress Notes (Signed)
Overall unchanged.  Patient still without pain.  Still with minimal left hip rotation and quadriceps function on the left.  No voluntary movement on the right.  Sensory examination continues to be intact.  Upper extremity function remains normal. ? ?Patient is status post arterial reconstruction with postoperative weakness.  Current imaging does not definitely give a complete explanation for his current symptoms.  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  I discussed things with number of my partners.  1 partners recommended we do a repeat MRI scan of his thoracic spine to evaluate for any late signal change in the cord consistent with ischemia.  I think this sounds like a good idea and I will order a follow-up MRI scan. ?

## 2021-12-29 NOTE — Progress Notes (Signed)
Occupational Therapy Treatment ?Patient Details ?Name: Allen Arias ?MRN: [id number] ?DOB: 11/18/1940 ?Today's Date: 12/29/2021 ? ? ?History of present illness Allen Arias is a 81 y.o. 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 Post-op he developed BLE dense weakness. 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  PMH includes HTN, carpal tunnel release, and eye sx. ?  ?OT comments ? Pt progressing towards OT goals this session. Video interpreter used [facility] [place] [phone]) throughout session. Pt with improvement in BLE muscle activation - but remains limited and continues to be max/total A for LB ADL at this time. Will need education in AE/compensatory strategies. Seated balance for ADL improving. Posterior lean still present - Pt vocalized fear of falling, with noted improvement after encouragement/reassurance. Pt able to complete 3 sit<>stands from EOB with Stedy - PT focusing on RLE and providing input to muscles while OT providing maximal assist for power up. Pt assisting with BUE. For 4th sit<>stand transferred to chair. Pt demonstrating good activity tolerance, family support, PLOF and continues to be excellent AIR candidate.   ? ?Recommendations for follow up therapy are one component of a multi-disciplinary discharge planning process, led by the attending physician.  Recommendations may be updated based on patient status, additional functional criteria and insurance authorization. ?   ?Follow Up Recommendations ? Acute inpatient rehab (3hours/day)  ?  ?Assistance Recommended at Discharge Frequent or constant Supervision/Assistance  ?Patient can return home with the following ? Two people to help with walking and/or transfers;A lot of help with  bathing/dressing/bathroom;Assistance with cooking/housework;Assistance with feeding;Assist for transportation;Help with stairs or ramp for entrance ?  ?Equipment Recommendations ? BSC/3in1;Tub/shower bench;Wheelchair (measurements OT);Wheelchair cushion (measurements OT);Other (comment) (defer to next venue of care)  ?  ?Recommendations for Other Services Rehab consult ? ?  ?Precautions / Restrictions Precautions ?Precautions: Fall ?Restrictions ?Weight Bearing Restrictions: No  ? ? ?  ? ?Mobility Bed Mobility ?Overal bed mobility: Needs Assistance ?Bed Mobility: Supine to Sit ?  ?  ?Supine to sit: Mod assist, +2 for physical assistance, +2 for safety/equipment, HOB elevated ?  ?  ?General bed mobility comments: Assist for BLE's off edge of bed, cues for reaching with RUE for bed rail, assist for trunk elevation to upright, posterior lean Pt vocalizes fear of falling ?  ? ?Transfers ?Overall transfer level: Needs assistance ?Equipment used: Ambulation equipment used ?Transfers: Bed to chair/wheelchair/BSC, Sit to/from Stand ?Sit to Stand: Mod assist, Max assist, +2 physical assistance, +2 safety/equipment ?  ?  ?  ?  ?  ?General transfer comment: Michaelyn Barter to transfer from bed to chair. Pt able to perform x3 sit to stands, max multimodal cues for pulling with BUE's and weightbearing through BLE's. Tactile facilitation to prevent LLE external rotation and for input for R quad activation ?Transfer via Lift Equipment: Stedy ?  ?Balance Overall balance assessment: Needs assistance ?Sitting-balance support: Bilateral upper extremity supported, Feet supported ?Sitting balance-Leahy Scale: Poor ?Sitting balance - Comments: requiring min guard-minA, cues for hand placement ?Postural control: Posterior lean ?  ?  ?  ?  ?  ?  ?  ?  ?  ?  ?  ?  ?  ?  ?   ? ?ADL either performed or assessed with clinical judgement  ? ?ADL Overall ADL's :  Needs assistance/impaired ?  ?  ?Grooming: Set up;Wash/dry hands;Wash/dry  face;Sitting ?Grooming Details (indicate cue type and reason): supported seat in the recliner ?Upper Body Bathing: Moderate assistance;Sitting ?Upper Body Bathing Details (indicate cue type and reason): for back sitting EOB ?  ?  ?  ?  ?Lower Body Dressing: Maximal assistance ?Lower Body Dressing Details (indicate cue type and reason): able to lift L leg to assist with donning of socks ?Toilet Transfer: Maximal assistance;+2 for physical assistance;+2 for safety/equipment (stedy) ?  ?  ?  ?  ?  ?Functional mobility during ADLs: Maximal assistance;+2 for physical assistance;+2 for safety/equipment ?General ADL Comments: needs AE education ?  ? ?Extremity/Trunk Assessment Upper Extremity Assessment ?Upper Extremity Assessment: Overall WFL for tasks assessed ?  ?  ?  ?  ?  ? ?Vision   ?Vision Assessment?: No apparent visual deficits ?  ?Perception   ?  ?Praxis   ?  ? ?Cognition Arousal/Alertness: Awake/alert ?Behavior During Therapy: [facility] for tasks assessed/performed, Flat affect ?Overall Cognitive Status: Difficult to assess ?  ?  ?  ?  ?  ?  ?  ?  ?  ?  ?  ?  ?  ?  ?  ?  ?General Comments: Utilized Stratus interpreter Rufina Falco [phone], pt seems mildly fearful of falling ?  ?  ?   ?Exercises   ? ?  ?Shoulder Instructions   ? ? ?  ?General Comments Son and wife present throughout session. Video interpreter used for translation at son's/patient request  ? ? ?Pertinent Vitals/ Pain       Pain Assessment ?Faces Pain Scale: Hurts a little bit ?Pain Location: groin incision, buttocks ?Pain Descriptors / Indicators: Discomfort, Grimacing, Sore ? ?Home Living   ?  ?  ?  ?  ?  ?  ?  ?  ?  ?  ?  ?  ?  ?  ?  ?  ?  ?  ? ?  ?Prior Functioning/Environment    ?  ?  ?  ?   ? ?Frequency ? Min 2X/week  ? ? ? ? ?  ?Progress Toward Goals ? ?OT Goals(current goals can now be found in the care plan section) ? Progress towards OT goals: Progressing toward goals ? ?Acute Rehab OT Goals ?Patient Stated Goal: walk again ?OT Goal Formulation:  With patient/family ?Time For Goal Achievement: 01/10/22 ?Potential to Achieve Goals: Good  ?Plan Discharge plan remains appropriate;Frequency remains appropriate   ? ?Co-evaluation ? ? ? PT/OT/SLP Co-Evaluation/Treatment: Yes ?Reason for Co-Treatment: Complexity of the patient's impairments (multi-system involvement);For patient/therapist safety;To address functional/ADL transfers ?PT goals addressed during session: Balance;Mobility/safety with mobility;Strengthening/ROM;Proper use of DME ?OT goals addressed during session: ADL's and self-care;Proper use of Adaptive equipment and DME;Strengthening/ROM ?  ? ?  ?AM-PAC OT "6 Clicks" Daily Activity     ?Outcome Measure ? ? Help from another person eating meals?: A Little ?Help from another person taking care of personal grooming?: A Little ?Help from another person toileting, which includes using toliet, bedpan, or urinal?: A Lot ?Help from another person bathing (including washing, rinsing, drying)?: A Lot ?Help from another person to put on and taking off regular upper body clothing?: A Little ?Help from another person to put on and taking off regular lower body clothing?: Total ?6 Click Score: 14 ? ?  ?End of Session Equipment Utilized During Treatment: Gait belt;Other (comment) Charlaine Dalton) ? ?OT Visit Diagnosis: Unsteadiness on feet (R26.81);Other abnormalities of gait and mobility (R26.89);Other symptoms  and signs involving the nervous system (R29.898) ?  ?Activity Tolerance Patient tolerated treatment well ?  ?Patient Left in chair;with call bell/phone within reach;with family/visitor present ?  ?Nurse Communication Mobility status;Precautions (use of pad to assist with squat pivot back to the bed, use drop arm on L side of chair to assist) ?  ? ?   ? ?Time: 0821-0900 ?OT Time Calculation (min): 39 min ? ?Charges: OT General Charges ?$OT Visit: 1 Visit ?OT Treatments ?$Self Care/Home Management : 8-22 mins ? ?Jesse Sans OTR/L ?Acute Rehabilitation Services ?Pager:  [phone] ?Office: [phone] ? ?Merri Ray Peggi Yono ?12/29/2021, 9:41 AM ?

## 2021-12-29 NOTE — Progress Notes (Addendum)
?  Progress Note ? ? ? ?12/29/2021 ?8:22 AM ?3 Days Post-Op ? ?Subjective: Patient's wife and son are present.  Patient's son acted as a Nurse, learning disability this morning.  The patient believes that the bilateral lower extremity leg weakness is stable from yesterday. ? ? ?Vitals:  ? 12/29/21 0700 12/29/21 0800  ?BP: 111/65 104/69  ?Pulse: 98 100  ?Resp: (!) 21 (!) 21  ?Temp:    ?SpO2: 94% 94%  ? ?Physical Exam: ?Cardiac:  RRR ?Lungs: Nonlabored ?Incisions:   Left popliteal and bilateral groin incisions clean dry and intact; left calf is soft to palpation ?Extremities: Brisk PT signal bilaterally; DP signal stronger on the right and left ?Abdomen: Soft ?Neurologic: Alert and oriented; motor deficit bilateral lower extremities however sensation is intact ? ?CBC ?   ?Component Value Date/Time  ? WBC 9.5 12/29/2021 0020  ? RBC 3.30 (L) 12/29/2021 0020  ? HGB 10.0 (L) 12/29/2021 0020  ? HCT 29.7 (L) 12/29/2021 0020  ? PLT 56 (L) 12/29/2021 0020  ? MCV 90.0 12/29/2021 0020  ? MCH 30.3 12/29/2021 0020  ? MCHC 33.7 12/29/2021 0020  ? RDW 17.8 (H) 12/29/2021 0020  ? LYMPHSABS 0.3 (L) 12/28/2021 1005  ? MONOABS 0.5 12/28/2021 1005  ? EOSABS 0.0 12/28/2021 1005  ? BASOSABS 0.0 12/28/2021 1005  ? ? ?BMET ?   ?Component Value Date/Time  ? NA 135 12/29/2021 0020  ? K 3.9 12/29/2021 0020  ? CL 107 12/29/2021 0020  ? CO2 22 12/29/2021 0020  ? GLUCOSE 104 (H) 12/29/2021 0020  ? BUN 21 12/29/2021 0020  ? CREATININE 0.88 12/29/2021 0020  ? CALCIUM 7.8 (L) 12/29/2021 0020  ? GFRNONAA >60 12/29/2021 0020  ? GFRAA >60 02/04/2020 0529  ? ? ?INR ?   ?Component Value Date/Time  ? INR 1.4 (H) 12/27/2021 0532  ? ? ? ?Intake/Output Summary (Last 24 hours) at 12/29/2021 6294 ?Last data filed at 12/29/2021 0800 ?Gross per 24 hour  ?Intake 1170 ml  ?Output 875 ml  ?Net 295 ml  ? ? ? ?Assessment/Plan:  81 y.o. male is s/p EVAR, bilateral common femoral thromboendarterectomy; left popliteal / TP trunk thromboendarterectomy 12/26/21 complicated by postoperative  lower extremity weakness 3 Days Post-Op  ? ?Bilateral lower extremities are warm and well-perfused based on Doppler exam ?CTA demonstrating normal postoperative findings at the groin incisions.  Groin incisions are soft without palpable hematoma; despite CTA finding of left calf hematoma, on exam left calf is soft to palpation.  No indication for exploration and evacuation at this time ?Urine output is marginal.  Continue maintenance fluids ?ABL anemia: CBC stable this morning.  No evidence of further bleeding ?Continue mobilization with therapy teams ?Dr. Jordan Likes considering cervical decompression and fusion later in the week ? ? ? ?Emilie Rutter, PA-C ?[facility] ?[phone] ?12/29/2021 ?8:22 AM ? ? ?I have independently interviewed and examined patient and agree with PA assessment and plan above. Does not appear to be bleeding at this time. Feet ar well perfused. Neurosurgery care much appreciated.  ? ?Beverley Sherrard C. Randie Heinz, MD ?[facility] ?Office: [phone] ?Pager: [phone] ? ?

## 2021-12-30 ENCOUNTER — Inpatient Hospital Stay (HOSPITAL_COMMUNITY): Payer: Medicare Other

## 2021-12-30 LAB — SEROTONIN RELEASE ASSAY (SRA)
SRA .2 IU/mL UFH Ser-aCnc: <1 % (ref 0–20)
SRA 100IU/mL UFH Ser-aCnc: <1 % (ref 0–20)

## 2021-12-30 IMAGING — MR MR THORACIC SPINE W/O CM
7 of 10 series · 24 of 48 positions shown · non-contrast
Comparison: Four days ago

CLINICAL DATA: Spinal cord injury, follow-up. Suspect cord
ischemia. Recent aortic grafting.

EXAM:
MRI THORACIC SPINE WITHOUT CONTRAST
TECHNIQUE: Multiplanar, multisequence MR imaging of the thoracic spine was
performed. No intravenous contrast was administered.

[Series 12: T1 · sagittal · 5.0mm · 1.46mm/px · 1 of 11 slices shown (1 of 4)]
[im 1/11]
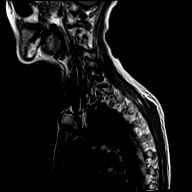

[Series 13: T1 · sagittal · 5.0mm · 1.23mm/px · 2 of 11 slices shown (2 of 4)]
[im 1/11]
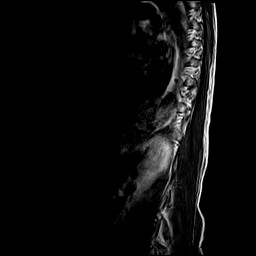
[im 11/11]
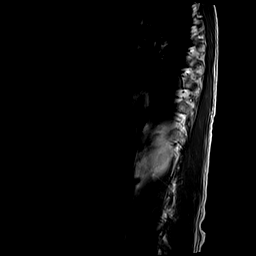

[Series 14: T1 · sagittal · 6.0mm · 1.23mm/px · 2 of 11 slices shown (3 of 4)]
[im 1/11]
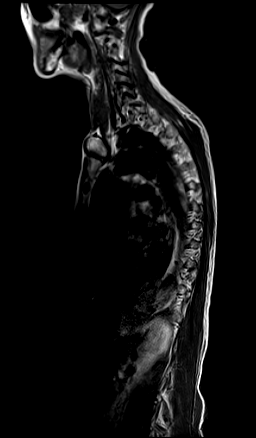
[im 11/11]
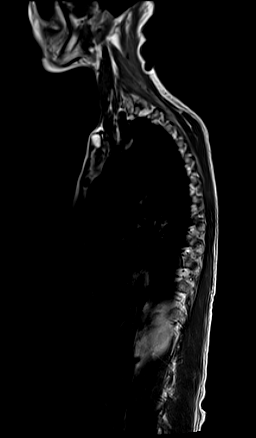

[Series 15: T2 · sagittal · 3.0mm · 0.85mm/px · 4 of 21 slices shown (1 of 2)]
[im 1/21]
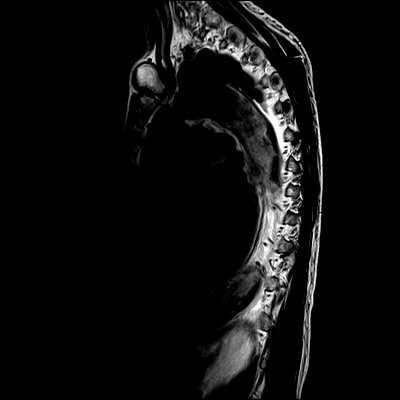
[im 7/21]
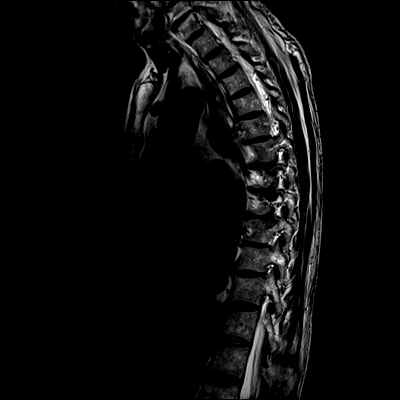
[im 14/21]
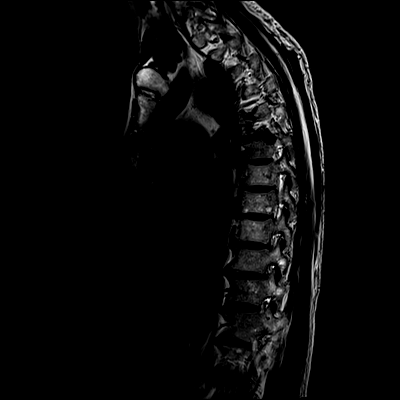
[im 21/21]
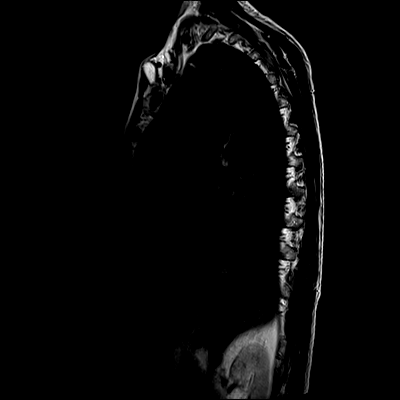

[Series 16: T1 · sagittal · 3.0mm · 0.85mm/px · 4 of 21 slices shown (4 of 4)]
[im 1/21]
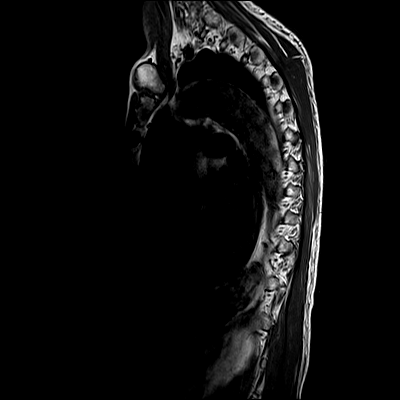
[im 7/21]
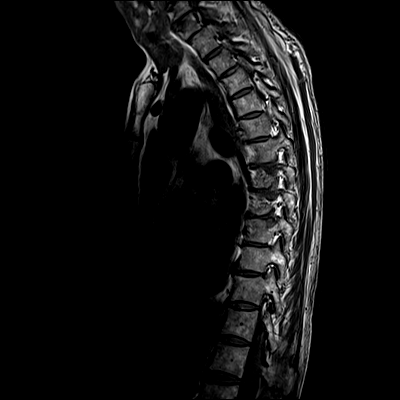
[im 14/21]
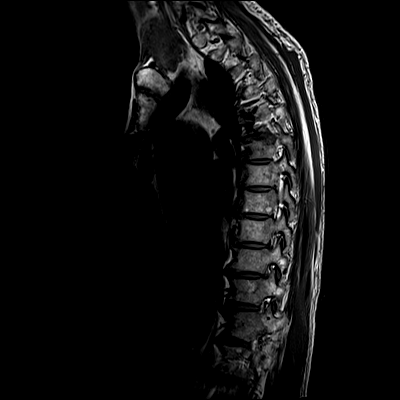
[im 21/21]
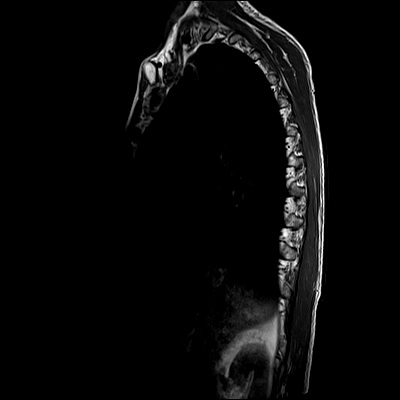

[Series 17: STIR · sagittal · 3.0mm · 0.43mm/px · 4 of 21 slices shown]
[im 1/21]
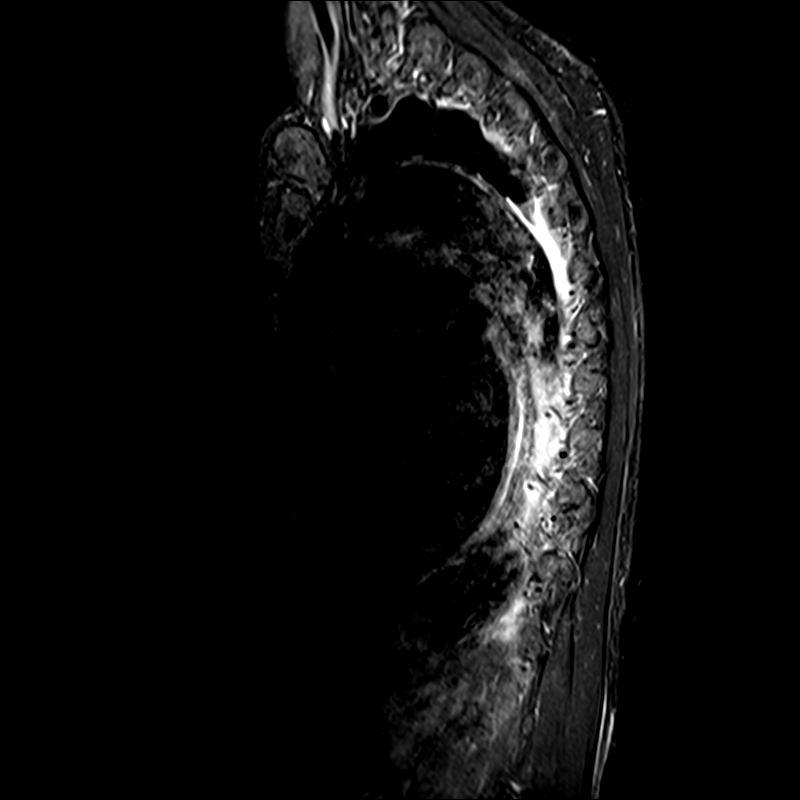
[im 7/21]
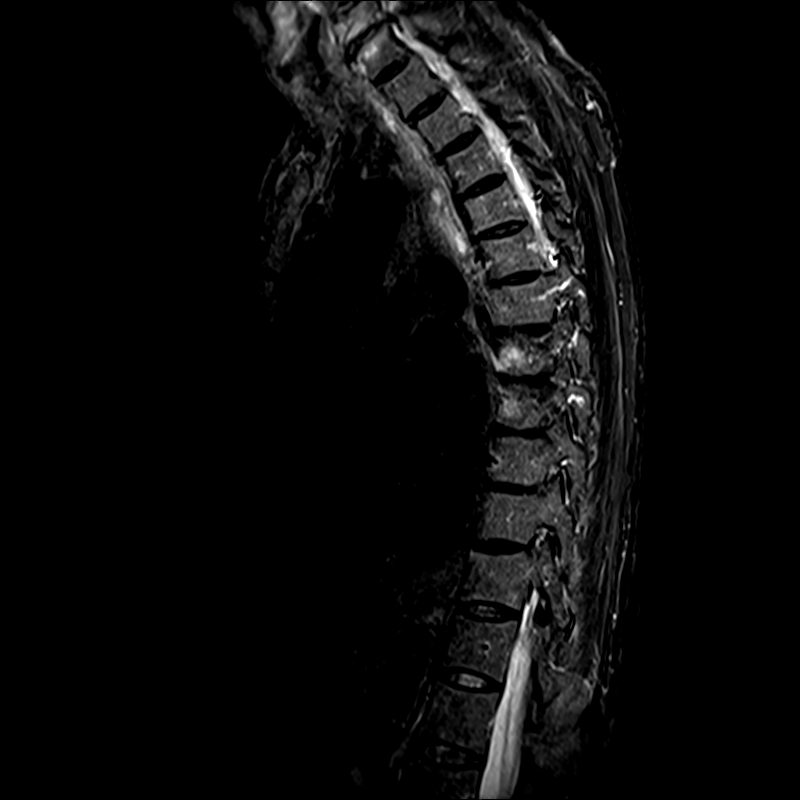
[im 14/21]
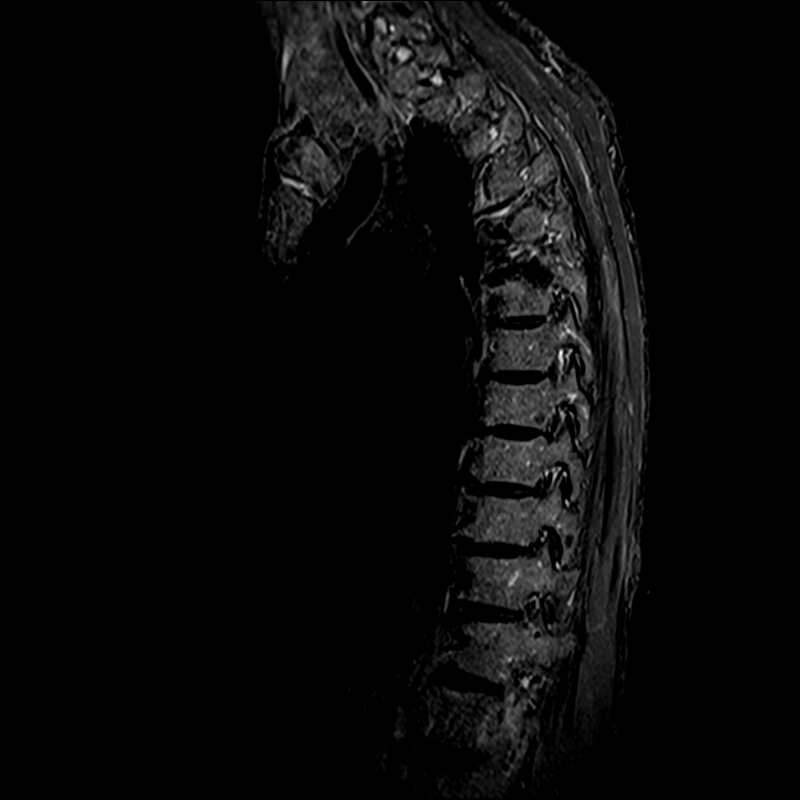
[im 21/21]
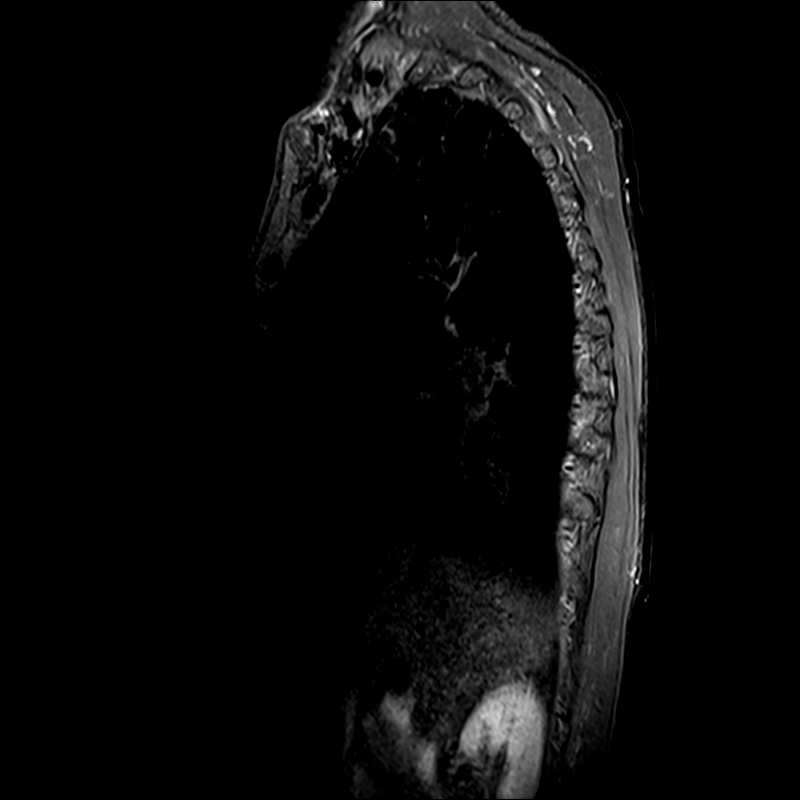

[Series 18: T2 · axial · 4.0mm · 0.59mm/px · z∈[-203,+3]mm · 7 of 39 slices shown (2 of 2)]
[im 1/39]
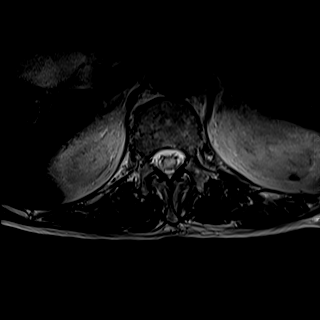
[im 7/39]
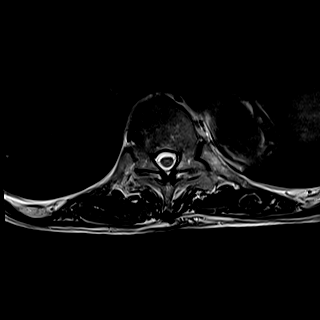
[im 13/39]
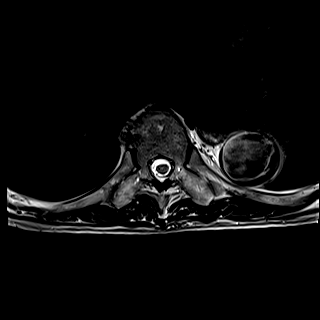
[im 20/39]
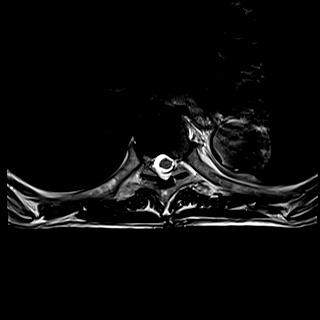
[im 26/39]
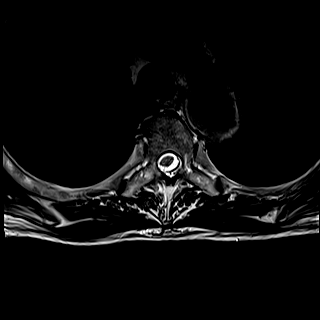
[im 32/39]
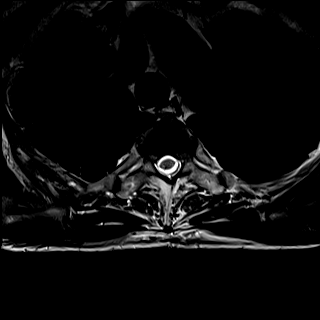
[im 39/39]
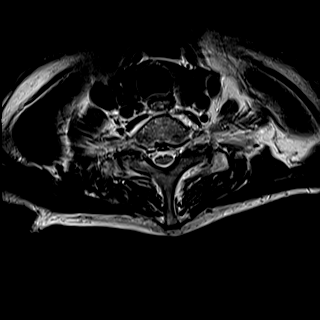

[24 of 48 positions shown; findings below may reference images not displayed]

FINDINGS: Alignment:  Exaggerated thoracic kyphosis with mild scoliosis.

Vertebrae: No evidence of bone infarct

Cord: New T2 hyperintensity and swelling of the central cord at the
level of T12 and L1, compatible with ischemia/infarct in this
setting. No spinal canal collection.

Paraspinal and other soft tissues: Thoracal abdominal aneurysm of
the aorta.

Disc levels:

Ordinary disc space narrowing and bulging. Scattered facet spurring
most notable on the right at T11-12.
IMPRESSION: Interval edematous swelling of the lower cord consistent with
infarct/ischemia.

## 2021-12-30 NOTE — Progress Notes (Signed)
Inpatient Rehab Admissions Coordinator:  ?Spoke with daughter at pt's bedside. Discussed if family had determined a plan for dispo. She is requesting more information in order to make a determination. Spoke with CM who is willing to speak with family. Will continue to follow. ? ? ? ?Gayland Curry, MS, CCC-SLP ?Admissions Coordinator ?[phone] ? ?

## 2021-12-30 NOTE — Progress Notes (Signed)
Patient arrived from [facility] to 29E23.  Telemetry monitor applied and CCMD notified.  Skin assessment and CHG bath completed.  Patient and daughter oriented to unit and room to include call light and phone.  Will continue to monitor. ?

## 2021-12-30 NOTE — PMR Pre-admission (Signed)
PMR Admission Coordinator Pre-Admission Assessment ? ?Patient: Allen Arias is an 81 y.o., male ?MRN: [id number] ?DOB: Apr 07, 1941 ?Height: 5\' 4"  (162.6 cm) ?Weight: 50 kg ? ?Insurance Information ?HMO:     PPO:      PCP:      IPA:      80/20: yes     OTHER:  ?PRIMARY: Medicare A & B      Policy#:      Subscriber: patient ?CM Name:       Phone#:      Fax#:  ?Pre-Cert#:       Employer:  ?Benefits:  Phone #: verified eligibility via [employer] on 12/30/21     Name:  ?Eff. Date: Part A & B effective 12/06/09     Deduct: $1,600      Out of Pocket Max: NA      Life Max: NA ?CIR: 100% coverage      SNF: 100% for days 1-20, 80% days 21-100 ?Outpatient: 80% coverage     Co-Pay: 20% ?Home Health: 100% coverage      Co-Pay:  ?DME: 80% coverage     Co-Pay: 20% ?Providers: pt's choice ?SECONDARY: Medicaid of       Policy#: 02/05/10 t     Phone#: [phone] ? ?Financial Counselor:       Phone#:  ? ?The ?Data Collection Information Summary? for patients in Inpatient Rehabilitation Facilities with attached ?Privacy Act Statement-Health Care Records? was provided and verbally reviewed with: Family ? ?Emergency Contact Information ?Contact Information   ? ? Name Relation Home Work Mobile  ? Axiel, Fjeld Daughter   [phone]  ? [phone] Spouse Jillyn Hidden    ? ?  ? ? 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 Pt presented to hospital on 12/26/21 for CT scan and lower extremity duplexes to evaluate for popliteal aneurysms. Aneurysm appeared to be greater than 5.5 cm. Pt underwent an Endovascular aortic aneurysm repair (EVAR) on 12/26/21. Post-op pt had decreased sensation and motor strength in bilateral lower extremities concerning for spinal cord ischemia. Thoracic MRI showed no signs of ischemia and no evidence of significant stenosis.  MRI cervical spine  showed evidence of significant multilevel cervical disc degeneration with moderately severe stenosis and early anterior listhesis at C4-5. Pt has significant spondylosis and broad-based disc bulging causing moderately severe stenosis at C5-6. Also has spondylosis and disc protrusion at C6-7 with severe stenosis; no evidence of definite signal abnormality. Pt had hemoglobin of 6.7; transfused 3 units PRBC. CTA showed left calf hematoma. No exploration or evacuation warranted at that time. 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 Cervical surgery not warranted at this time.  Therapy evaluations completed and CIR recommended d/t pt's deficits with functional mobility and inability to complete ADLs independently. ?  ? ?Patient's medical record from [facility] has been reviewed by the rehabilitation admission coordinator and physician. ? ?Past Medical History  ?Past Medical History:  ?Diagnosis Date  ? AAA (abdominal aortic aneurysm) (HCC)   ? Hypertension   ? ? ?Has the patient had major surgery during 100 days prior to admission? Yes ? ?Family History   ?family history includes Gallbladder disease in his mother; Gastric cancer in his father. ? ?Current Medications ? ?Current Facility-Administered Medications:  ?  0.9 %  sodium chloride infusion (Manually program via Guardrails IV Fluids), ,  Intravenous, Once, Emilie RutterEveland, Matthew, PA-C ?  0.9 %  sodium chloride infusion, 500 mL, Intravenous, Once PRN, Emilie RutterEveland, Matthew, PA-C ?  0.9 %  sodium chloride infusion, 500 mL, Intravenous, Once PRN, Emilie RutterEveland, Matthew, PA-C ?  0.9 %  sodium chloride infusion, , Intravenous, Continuous, Eveland, Matthew, PA-C, Stopped at 12/27/21 1000 ?  0.9 %  sodium chloride infusion, 250 mL, Intravenous, Continuous, Emilie Rutterveland, Matthew, PA-C, Last Rate: 10 mL/hr at 12/26/21 2353, 250 mL at 12/26/21 2353 ?  0.9 %  sodium chloride infusion, 250 mL, Intravenous, PRN, Emilie RutterEveland, Matthew, PA-C, Last Rate: 10 mL/hr at 12/28/21  2143, 250 mL at 12/28/21 2143 ?  acetaminophen (TYLENOL) tablet 325-650 mg, 325-650 mg, Oral, Q4H PRN, 650 mg at 12/30/21 1322 **OR** acetaminophen (TYLENOL) suppository 325-650 mg, 325-650 mg, Rectal, Q4H PRN, Emilie RutterEveland, Matthew, PA-C ?  alum & mag hydroxide-simeth (MAALOX/MYLANTA) 200-200-20 MG/5ML suspension 15-30 mL, 15-30 mL, Oral, Q2H PRN, Eveland, Matthew, PA-C ?  bisacodyl (DULCOLAX) EC tablet 5 mg, 5 mg, Oral, Daily PRN, Emilie RutterEveland, Matthew, PA-C ?  Chlorhexidine Gluconate Cloth 2 % PADS 6 each, 6 each, Topical, Q0600, Emilie Rutterveland, Matthew, PA-C, 6 each at 12/28/21 [id number] ?  docusate sodium (COLACE) capsule 100 mg, 100 mg, Oral, Daily, Emilie Rutterveland, Matthew, PA-C, 100 mg at 12/30/21 1024 ?  guaiFENesin-dextromethorphan (ROBITUSSIN DM) 100-10 MG/5ML syrup 15 mL, 15 mL, Oral, Q4H PRN, Emilie RutterEveland, Matthew, PA-C ?  magnesium sulfate IVPB 2 g 50 mL, 2 g, Intravenous, Daily PRN, Emilie RutterEveland, Matthew, PA-C ?  morphine (PF) 2 MG/ML injection 2-5 mg, 2-5 mg, Intravenous, Q1H PRN, Emilie RutterEveland, Matthew, PA-C, 2 mg at 12/26/21 1521 ?  ondansetron (ZOFRAN) injection 4 mg, 4 mg, Intravenous, Q6H PRN, Emilie RutterEveland, Matthew, PA-C, 4 mg at 12/26/21 1808 ?  oxyCODONE-acetaminophen (PERCOCET/ROXICET) 5-325 MG per tablet 1-2 tablet, 1-2 tablet, Oral, Q4H PRN, Emilie RutterEveland, Matthew, PA-C, 1 tablet at 12/27/21 [id number] ?  pantoprazole (PROTONIX) EC tablet 40 mg, 40 mg, Oral, Daily, Emilie Rutterveland, Matthew, PA-C, 40 mg at 12/31/21 1024 ?  phenol (CHLORASEPTIC) mouth spray 1 spray, 1 spray, Mouth/Throat, PRN, Eveland, Matthew, PA-C ?  potassium chloride SA (KLOR-CON M) CR tablet 20-40 mEq, 20-40 mEq, Oral, Daily PRN, Emilie RutterEveland, Matthew, PA-C ?  senna-docusate (Senokot-S) tablet 1 tablet, 1 tablet, Oral, QHS PRN, Emilie RutterEveland, Matthew, PA-C ?  simvastatin (ZOCOR) tablet 20 mg, 20 mg, Oral, QHS, Eveland, Matthew, PA-C, 20 mg at 12/30/21 2104 ?  sodium chloride flush (NS) 0.9 % injection 3 mL, 3 mL, Intravenous, Q12H, Eveland, Matthew, PA-C, 3 mL at 12/31/21 1032 ?  sodium chloride flush  (NS) 0.9 % injection 3 mL, 3 mL, Intravenous, PRN, Emilie RutterEveland, Matthew, PA-C ?  sodium phosphate (FLEET) 7-19 GM/118ML enema 1 enema, 1 enema, Rectal, Once PRN, Emilie RutterEveland, Matthew, PA-C ?  traZODone (DESYREL) tablet 50 mg, 50 mg, Oral, QHS PRN, Emilie RutterEveland, Matthew, PA-C, 50 mg at 12/30/21 2103 ? ?Patients Current Diet:  ?Diet Order   ? ?       ?  Diet Heart Room service appropriate? Yes; Fluid consistency: Thin  Diet effective now       ?  ? ?  ?  ? ?  ? ? ?Precautions / Restrictions ?Precautions ?Precautions: Fall ?Restrictions ?Weight Bearing Restrictions: No  ? ?Has the patient had 2 or more falls or a fall with injury in the past year? No ? ?Prior Activity Level ?Community (5-7x/wk): drives, gets out of house daily ? ?Prior Functional Level ?Self Care: Did the patient need help bathing, dressing, using the toilet or eating? Independent ? ?  Indoor Mobility: Did the patient need assistance with walking from room to room (with or without device)? Independent ? ?Stairs: Did the patient need assistance with internal or external stairs (with or without device)? Independent ? ?Functional Cognition: Did the patient need help planning regular tasks such as shopping or remembering to take medications? Independent ? ?Patient Information ?Are you of Hispanic, Latino/a,or Spanish origin?: A. No, not of Hispanic, Latino/a, or Spanish origin ?What is your race?: H. [place] ?Do you need or want an interpreter to communicate with a doctor or health care staff?: 1. Yes ? ?Patient's Response To:  ?Health Literacy and Transportation ?Is the patient able to respond to health literacy and transportation needs?: Yes ?Health Literacy - How often do you need to have someone help you when you read instructions, pamphlets, or other written material from your doctor or pharmacy?: Never (was told he can read it but has some difficulty speaking [place]) ?In the past 12 months, has lack of transportation kept you from medical appointments or from  getting medications?: No ?In the past 12 months, has lack of transportation kept you from meetings, work, or from getting things needed for daily living?: No ? ?Home Assistive Devices / Equipment ?Home Assistive

## 2021-12-30 NOTE — Progress Notes (Addendum)
Physical Therapy Treatment ?Patient Details ?Name: Allen Arias ?MRN: [id number] ?DOB: December 19, 1940 ?Today's Date: 12/30/2021 ? ? ?History of Present Illness Allen Arias is a 81 y.o. 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 Post-op he developed BLE dense weakness. 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  Repeat MRI 4/24 showing New T2 hyperintensity and swelling of the central cord at the  level of T12 and L1, compatible with ischemia/infarct in this setting. PMH includes HTN, carpal tunnel release, and eye sx. ? ?  ?PT Comments  ? ? Pt progressing well towards his physical therapy goals; he is very motivated to participate and demonstrates great activity tolerance. Performed AAROM/PROM to bilateral lower extremities. Worked on static/dynamic sitting balance with pt progressing to no hand support edge of bed. Pt requiring two person maximal assist for lateral scoot transfer from bed to chair. Has fear of falling which creates posterior bias at times; instruction for head/hip relationship. Pt is an excellent candidate for AIR based on motivation, family support, and PLOF.  ?   ?Recommendations for follow up therapy are one component of a multi-disciplinary discharge planning process, led by the attending physician.  Recommendations may be updated based on patient status, additional functional criteria and insurance authorization. ? ?Follow Up Recommendations ? Acute inpatient rehab (3hours/day) ?  ?  ?Assistance Recommended at Discharge Frequent or constant Supervision/Assistance  ?Patient can return home with the following Two people to help with walking and/or transfers;A lot of help with bathing/dressing/bathroom ?  ?Equipment Recommendations ?  Wheelchair (measurements PT);Wheelchair cushion (measurements PT);BSC/3in1  ?  ?Recommendations for Other Services   ? ? ?  ?Precautions / Restrictions Precautions ?Precautions: Fall ?Restrictions ?Weight Bearing Restrictions: No  ?  ? ?Mobility ? Bed Mobility ?Overal bed mobility: Needs Assistance ?Bed Mobility: Supine to Sit ?  ?  ?Supine to sit: Mod assist, +2 for physical assistance ?  ?  ?General bed mobility comments: Assist for BLE's off edge of bed, cues for reaching with RUE for bed rail, assist for trunk elevation to upright, posterior lean Pt vocalizes fear of falling ?  ? ?Transfers ?Overall transfer level: Needs assistance ?Equipment used: None ?Transfers: Bed to chair/wheelchair/BSC ?  ?  ?  ?Squat pivot transfers: Max assist, +2 physical assistance ?  ?  ?General transfer comment: MaxA + 2 to transfer towards right side. Multimodal cues for head/hip relationship, anterior weight shift, use of arms ?  ? ?Ambulation/Gait ?  ?  ?  ?  ?  ?  ?  ?  ? ? ?Stairs ?  ?  ?  ?  ?  ? ? ?Wheelchair Mobility ?  ? ?Modified Rankin (Stroke Patients Only) ?  ? ? ?  ?Balance Overall balance assessment: Needs assistance ?Sitting-balance support: No upper extremity supported, Feet supported ?Sitting balance-Leahy Scale: Fair ?Sitting balance - Comments: Able to progress to supervision with no BUE support, but when fearful, tends to have posterior bias ?  ?  ?  ?  ?  ?  ?  ?  ?  ?  ?  ?  ?  ?  ?  ?  ? ?  ?Cognition Arousal/Alertness: Awake/alert ?Behavior During Therapy: [facility] for tasks assessed/performed, Flat affect ?Overall Cognitive Status: Difficult to assess ?  ?  ?  ?  ?  ?  ?  ?  ?  ?  ?  ?  ?  ?  ?  ?  ?  General Comments: Utilized [employer] interpreter Rich Reining [phone], pt seems mildly fearful of falling ?  ?  ? ?  ?Exercises General Exercises - Lower Extremity ?Ankle Circles/Pumps: PROM, Both, 10 reps, Supine ?Quad Sets: AROM, Both, 10 reps, Supine ?Heel Slides: PROM, Both, 10 reps, Supine ?Hip ABduction/ADduction:  PROM, Both, 10 reps, Supine ?Other Exercises ?Other Exercises: Work on static sitting balance progressing to BUE support to single UE to no UE. Then functional reaching with BUE's ? ?  ?General Comments   ?  ?  ? ?Pertinent Vitals/Pain Pain Assessment ?Pain Assessment: Faces ?Faces Pain Scale: No hurt  ? ? ?Home Living   ?  ?  ?  ?  ?  ?  ?  ?  ?  ?   ?  ?Prior Function    ?  ?  ?   ? ?PT Goals (current goals can now be found in the care plan section) Acute Rehab PT Goals ?Potential to Achieve Goals: Good ?Additional Goals ?Additional Goal #1: Pt will propel w/c x 150 ft with BUE's and supervision ?Progress towards PT goals: Progressing toward goals ? ?  ?Frequency ? ? ? Min 4X/week ? ? ? ?  ?PT Plan Current plan remains appropriate  ? ? ?Co-evaluation   ?  ?  ?  ?  ? ?  ?AM-PAC PT "6 Clicks" Mobility   ?Outcome Measure ? Help needed turning from your back to your side while in a flat bed without using bedrails?: A Lot ?Help needed moving from lying on your back to sitting on the side of a flat bed without using bedrails?: A Lot ?Help needed moving to and from a bed to a chair (including a wheelchair)?: Total ?Help needed standing up from a chair using your arms (e.g., wheelchair or bedside chair)?: Total ?Help needed to walk in hospital room?: Total ?Help needed climbing 3-5 steps with a railing? : Total ?6 Click Score: 8 ? ?  ?End of Session Equipment Utilized During Treatment: Gait belt ?Activity Tolerance: Patient tolerated treatment well ?Patient left: in chair;with call bell/phone within reach;with family/visitor present ?Nurse Communication: Mobility status ?PT Visit Diagnosis: Other abnormalities of gait and mobility (R26.89);Difficulty in walking, not elsewhere classified (R26.2) ?  ? ? ?Time: 8850-2774 ?PT Time Calculation (min) (ACUTE ONLY): 30 min ? ?Charges:  $Therapeutic Activity: 8-22 mins ?$Neuromuscular Re-education: 8-22 mins          ?          ? ?Lillia Pauls, PT, DPT ?Acute Rehabilitation  Services ?Pager [phone] ?Office [phone] ? ? ? ?Carloine Ernestina Penna ?12/30/2021, 5:04 PM ? ?

## 2021-12-30 NOTE — Progress Notes (Signed)
Follow-up MRI scan of his thoracic spine demonstrates evidence of an ischemic insult to his lower thoracic cord.  This certainly makes much better sense anatomically given his current examination.  Although the patient has significant cervical spondylosis and stenosis currently he is minimally symptomatic from this, if at all.  I recommend outpatient follow-up with me over the next month to discuss relative risks and benefits with regard to proceeding with cervical surgery for hopeful prevention of progressive myelopathy.  I do not feel that cervical decompressive surgery would be of any benefit to his current situation. ?

## 2021-12-30 NOTE — Progress Notes (Addendum)
?  Progress Note ? ? ? ?12/30/2021 ?7:54 AM ?4 Days Post-Op ? ?Subjective:  no complaints.  Leg weakness stable ? ? ?Vitals:  ? 12/30/21 0700 12/30/21 0732  ?BP:    ?Pulse:    ?Resp: 18   ?Temp:  97.9 ?F (36.6 ?C)  ?SpO2:    ? ?Physical Exam: ?Lungs:  non labored ?Incisions:  groin incisions c/d/I; L popliteal incision c/d/I without hematoma ?Extremities:  brisk DP and TP signals by doppler ?Neurologic: leg weakness; sensation intact ? ?CBC ?   ?Component Value Date/Time  ? WBC 9.5 12/29/2021 0020  ? RBC 3.30 (L) 12/29/2021 0020  ? HGB 10.0 (L) 12/29/2021 0020  ? HCT 29.7 (L) 12/29/2021 0020  ? PLT 56 (L) 12/29/2021 0020  ? MCV 90.0 12/29/2021 0020  ? MCH 30.3 12/29/2021 0020  ? MCHC 33.7 12/29/2021 0020  ? RDW 17.8 (H) 12/29/2021 0020  ? LYMPHSABS 0.3 (L) 12/28/2021 1005  ? MONOABS 0.5 12/28/2021 1005  ? EOSABS 0.0 12/28/2021 1005  ? BASOSABS 0.0 12/28/2021 1005  ? ? ?BMET ?   ?Component Value Date/Time  ? NA 135 12/29/2021 0020  ? K 3.9 12/29/2021 0020  ? CL 107 12/29/2021 0020  ? CO2 22 12/29/2021 0020  ? GLUCOSE 104 (H) 12/29/2021 0020  ? BUN 21 12/29/2021 0020  ? CREATININE 0.88 12/29/2021 0020  ? CALCIUM 7.8 (L) 12/29/2021 0020  ? GFRNONAA >60 12/29/2021 0020  ? GFRAA >60 02/04/2020 0529  ? ? ?INR ?   ?Component Value Date/Time  ? INR 1.4 (H) 12/27/2021 0532  ? ? ? ?Intake/Output Summary (Last 24 hours) at 12/30/2021 0754 ?Last data filed at 12/30/2021 0700 ?Gross per 24 hour  ?Intake 320 ml  ?Output 465 ml  ?Net -145 ml  ? ? ? ?Assessment/Plan:  81 y.o. male is s/p EVAR, bilateral common femoral thromboendarterectomy; left popliteal / TP trunk thromboendarterectomy [id number] complicated by postoperative lower extremity weakness  4 Days Post-Op  ? ?BLE well perfused based on doppler exam ?Leg weakness: MR demonstrating cord infarct; continue work with therapy ?ABL anemia: labs pending ?Ok to transfer to 4e ? ? ?Dagoberto Ligas, PA-C ?[facility] ?[phone] ?12/30/2021 ?7:54 AM ? ?I have  independently interviewed and examined patient and agree with PA assessment and plan above.  MRI reviewed and discussed with Dr. Trenton Gammon and his input is much appreciated.  Unfortunately appears to have a thoracic cord infarction likely secondary to EVAR with possible showering to the hypogastric branches supplying the spinal cord with either acute or chronic occlusion of thoracic spinal artery branches due to thrombus burden of the entire aorta.  His legs are well perfused with strong signals bilaterally.  Sensation remains intact.  I have had long discussion with the patient and his family at bedside including part of the conversation using video interpreter discussed the poor prognosis and need for therapy moving forward. ? ?Guss Farruggia C. Donzetta Matters, MD ?[facility] ?Office: [phone] ?Pager: [phone] ? ? ? ?

## 2021-12-30 NOTE — Progress Notes (Signed)
Monitored transport to 4E23. Tolerated well. VSS. Daughter present.  ?

## 2021-12-30 NOTE — TOC Progression Note (Signed)
Transition of Care (TOC) - Progression Note  ? ? ?Patient Details  ?Name: Allen Arias ?MRN: [id number] ?Date of Birth: 1940/10/05 ? ?Transition of Care (TOC) CM/SW Contact  ?Gala Lewandowsky, RN ?Phone Number: ?12/30/2021, 4:23 PM ? ?Clinical Narrative: Case Manager received a call from the Inpatient Rehab Admissions Coordinator regarding care for the patient in the home. Per daughter, she cannot provide care during the day and can only secure care at night. Previous Case Manager had discussed personal care services with the family. Case Manager initiated the process and will place the forms on the chart. Case Manager attempted to secure chat the PA for vascular for signature. Case Manager will place the information on the shadow chart for a signature and then Case Manager will fax the information to [employer] in [place] at [phone]. Inpatient Rehab Admissions Coordinator will continue to follow for disposition needs. Case Manager will continue to follow as well.  ? ?Expected Discharge Plan: IP Rehab Facility ?Barriers to Discharge: Continued Medical Work up ? ?Expected Discharge Plan and Services ?Expected Discharge Plan: IP Rehab Facility ?In-house Referral: Clinical Social Work ?Discharge Planning Services: CM Consult ?  ? ?Readmission Risk Interventions ?   ? View : No data to display.  ?  ?  ?  ? ? ?

## 2021-12-30 NOTE — Progress Notes (Signed)
Critical radiology results(spinal cord infarct/Ischemia)conveyed to Dr Randie Heinz.  ?

## 2021-12-31 ENCOUNTER — Other Ambulatory Visit: Payer: Self-pay

## 2021-12-31 ENCOUNTER — Encounter (HOSPITAL_COMMUNITY): Payer: Self-pay | Admitting: Vascular Surgery

## 2021-12-31 ENCOUNTER — Encounter (HOSPITAL_COMMUNITY): Payer: Self-pay | Admitting: Physical Medicine and Rehabilitation

## 2021-12-31 ENCOUNTER — Inpatient Hospital Stay (HOSPITAL_COMMUNITY)
Admission: RE | Admit: 2021-12-31 | Discharge: 2022-01-26 | DRG: 052 | Disposition: A | Discharge status: Skilled Nursing Facility | Payer: Medicare Other | Source: Intra-hospital | Attending: Physical Medicine and Rehabilitation | Admitting: Physical Medicine and Rehabilitation

## 2021-12-31 DIAGNOSIS — I10 Essential (primary) hypertension: Secondary | ICD-10-CM | POA: Diagnosis present

## 2021-12-31 DIAGNOSIS — R159 Full incontinence of feces: Secondary | ICD-10-CM | POA: Diagnosis present

## 2021-12-31 DIAGNOSIS — R351 Nocturia: Secondary | ICD-10-CM | POA: Diagnosis present

## 2021-12-31 DIAGNOSIS — Z960 Presence of urogenital implants: Secondary | ICD-10-CM | POA: Diagnosis present

## 2021-12-31 DIAGNOSIS — R29898 Other symptoms and signs involving the musculoskeletal system: Secondary | ICD-10-CM

## 2021-12-31 DIAGNOSIS — R7989 Other specified abnormal findings of blood chemistry: Secondary | ICD-10-CM | POA: Diagnosis present

## 2021-12-31 DIAGNOSIS — D696 Thrombocytopenia, unspecified: Secondary | ICD-10-CM | POA: Diagnosis present

## 2021-12-31 DIAGNOSIS — I700 Atherosclerosis of aorta: Secondary | ICD-10-CM | POA: Diagnosis present

## 2021-12-31 DIAGNOSIS — R58 Hemorrhage, not elsewhere classified: Secondary | ICD-10-CM | POA: Diagnosis present

## 2021-12-31 DIAGNOSIS — D539 Nutritional anemia, unspecified: Secondary | ICD-10-CM | POA: Diagnosis not present

## 2021-12-31 DIAGNOSIS — K5909 Other constipation: Secondary | ICD-10-CM | POA: Diagnosis present

## 2021-12-31 DIAGNOSIS — G9511 Acute infarction of spinal cord (embolic) (nonembolic): Secondary | ICD-10-CM | POA: Diagnosis not present

## 2021-12-31 DIAGNOSIS — R338 Other retention of urine: Secondary | ICD-10-CM | POA: Diagnosis present

## 2021-12-31 DIAGNOSIS — B9620 Unspecified Escherichia coli [E. coli] as the cause of diseases classified elsewhere: Secondary | ICD-10-CM | POA: Diagnosis not present

## 2021-12-31 DIAGNOSIS — Z8679 Personal history of other diseases of the circulatory system: Secondary | ICD-10-CM

## 2021-12-31 DIAGNOSIS — E785 Hyperlipidemia, unspecified: Secondary | ICD-10-CM | POA: Diagnosis present

## 2021-12-31 DIAGNOSIS — R630 Anorexia: Secondary | ICD-10-CM

## 2021-12-31 DIAGNOSIS — D62 Acute posthemorrhagic anemia: Secondary | ICD-10-CM | POA: Diagnosis present

## 2021-12-31 DIAGNOSIS — I714 Abdominal aortic aneurysm, without rupture, unspecified: Secondary | ICD-10-CM | POA: Diagnosis present

## 2021-12-31 DIAGNOSIS — K8020 Calculus of gallbladder without cholecystitis without obstruction: Secondary | ICD-10-CM

## 2021-12-31 DIAGNOSIS — N390 Urinary tract infection, site not specified: Secondary | ICD-10-CM

## 2021-12-31 DIAGNOSIS — K592 Neurogenic bowel, not elsewhere classified: Secondary | ICD-10-CM | POA: Diagnosis present

## 2021-12-31 DIAGNOSIS — D72829 Elevated white blood cell count, unspecified: Secondary | ICD-10-CM | POA: Diagnosis not present

## 2021-12-31 DIAGNOSIS — G8382 Anterior cord syndrome: Secondary | ICD-10-CM | POA: Diagnosis present

## 2021-12-31 DIAGNOSIS — G8918 Other acute postprocedural pain: Secondary | ICD-10-CM | POA: Diagnosis present

## 2021-12-31 DIAGNOSIS — Z800 Family history of malignant neoplasm of digestive organs: Secondary | ICD-10-CM

## 2021-12-31 DIAGNOSIS — F329 Major depressive disorder, single episode, unspecified: Secondary | ICD-10-CM

## 2021-12-31 DIAGNOSIS — Z1152 Encounter for screening for COVID-19: Secondary | ICD-10-CM

## 2021-12-31 DIAGNOSIS — R609 Edema, unspecified: Secondary | ICD-10-CM | POA: Diagnosis not present

## 2021-12-31 DIAGNOSIS — N39490 Overflow incontinence: Secondary | ICD-10-CM | POA: Diagnosis not present

## 2021-12-31 DIAGNOSIS — N5089 Other specified disorders of the male genital organs: Secondary | ICD-10-CM | POA: Diagnosis present

## 2021-12-31 DIAGNOSIS — N319 Neuromuscular dysfunction of bladder, unspecified: Secondary | ICD-10-CM

## 2021-12-31 DIAGNOSIS — G4700 Insomnia, unspecified: Secondary | ICD-10-CM

## 2021-12-31 DIAGNOSIS — N401 Enlarged prostate with lower urinary tract symptoms: Secondary | ICD-10-CM | POA: Diagnosis not present

## 2021-12-31 DIAGNOSIS — E871 Hypo-osmolality and hyponatremia: Secondary | ICD-10-CM | POA: Diagnosis present

## 2021-12-31 DIAGNOSIS — Z87891 Personal history of nicotine dependence: Secondary | ICD-10-CM

## 2021-12-31 DIAGNOSIS — Z79899 Other long term (current) drug therapy: Secondary | ICD-10-CM

## 2021-12-31 DIAGNOSIS — Z95820 Peripheral vascular angioplasty status with implants and grafts: Secondary | ICD-10-CM

## 2021-12-31 DIAGNOSIS — R600 Localized edema: Secondary | ICD-10-CM | POA: Diagnosis present

## 2021-12-31 DIAGNOSIS — R238 Other skin changes: Secondary | ICD-10-CM | POA: Diagnosis not present

## 2021-12-31 DIAGNOSIS — R5381 Other malaise: Secondary | ICD-10-CM | POA: Diagnosis not present

## 2021-12-31 DIAGNOSIS — G8222 Paraplegia, incomplete: Principal | ICD-10-CM | POA: Diagnosis present

## 2021-12-31 DIAGNOSIS — Z7982 Long term (current) use of aspirin: Secondary | ICD-10-CM

## 2021-12-31 DIAGNOSIS — R519 Headache, unspecified: Secondary | ICD-10-CM | POA: Diagnosis not present

## 2021-12-31 DIAGNOSIS — M4802 Spinal stenosis, cervical region: Secondary | ICD-10-CM | POA: Diagnosis present

## 2021-12-31 DIAGNOSIS — F32A Depression, unspecified: Secondary | ICD-10-CM | POA: Diagnosis not present

## 2021-12-31 DIAGNOSIS — Z9889 Other specified postprocedural states: Secondary | ICD-10-CM

## 2021-12-31 MED ORDER — FLEET ENEMA 7-19 GM/118ML RE ENEM
1.0000 | ENEMA | Freq: Once | RECTAL | Status: DC | PRN
Start: 2021-12-31 — End: 2022-01-26

## 2021-12-31 MED ORDER — OXYCODONE-ACETAMINOPHEN 5-325 MG PO TABS
1.0000 | ORAL_TABLET | ORAL | Status: DC | PRN
  Administered 2022-01-15 – 2022-01-16 (×2): 1 via ORAL
  Administered 2022-01-17 – 2022-01-19 (×2): 2 via ORAL
  Filled 2021-12-31: qty 1
  Filled 2021-12-31 (×3): qty 2
  Filled 2021-12-31: qty 1

## 2021-12-31 MED ORDER — TRAZODONE HCL 50 MG PO TABS
25.0000 mg | ORAL_TABLET | Freq: Every evening | ORAL | Status: DC | PRN
  Administered 2021-12-31 – 2022-01-01 (×2): 50 mg via ORAL
  Filled 2021-12-31 (×2): qty 1

## 2021-12-31 MED ORDER — PHENOL 1.4 % MT LIQD
1.0000 | OROMUCOSAL | Status: DC | PRN
Start: 2021-12-31 — End: 2022-01-26
  Filled 2021-12-31: qty 177

## 2021-12-31 MED ORDER — LIDOCAINE HCL URETHRAL/MUCOSAL 2 % EX GEL
CUTANEOUS | Status: DC | PRN
Start: 2021-12-31 — End: 2022-01-26
  Administered 2022-01-03: 6 via TOPICAL
  Filled 2021-12-31 (×3): qty 6

## 2021-12-31 MED ORDER — ACETAMINOPHEN 325 MG PO TABS: 325.0000 mg | ORAL_TABLET | ORAL | Status: DC | PRN

## 2021-12-31 MED ORDER — ALUM & MAG HYDROXIDE-SIMETH 200-200-20 MG/5ML PO SUSP: 30.0000 mL | ORAL | Status: DC | PRN

## 2021-12-31 MED ORDER — PROCHLORPERAZINE MALEATE 5 MG PO TABS: 5.0000 mg | ORAL_TABLET | Freq: Four times a day (QID) | ORAL | Status: DC | PRN

## 2021-12-31 MED ORDER — POLYETHYLENE GLYCOL 3350 17 G PO PACK: 17.0000 g | PACK | Freq: Every day | ORAL | Status: DC | PRN

## 2021-12-31 MED ORDER — BISACODYL 10 MG RE SUPP: 10.0000 mg | Freq: Every day | RECTAL | Status: DC | PRN

## 2021-12-31 MED ORDER — SIMVASTATIN 20 MG PO TABS
20.0000 mg | ORAL_TABLET | Freq: Every day | ORAL | Status: DC
  Administered 2021-12-31 – 2022-01-01 (×2): 20 mg via ORAL
  Filled 2021-12-31 (×2): qty 1

## 2021-12-31 MED ORDER — PROCHLORPERAZINE EDISYLATE 10 MG/2ML IJ SOLN: 5.0000 mg | Freq: Four times a day (QID) | INTRAMUSCULAR | Status: DC | PRN

## 2021-12-31 MED ORDER — ACETAMINOPHEN 325 MG PO TABS
650.0000 mg | ORAL_TABLET | Freq: Three times a day (TID) | ORAL | Status: DC
  Administered 2022-01-01 – 2022-01-26 (×66): 650 mg via ORAL
  Filled 2021-12-31 (×71): qty 2

## 2021-12-31 MED ORDER — PROCHLORPERAZINE 25 MG RE SUPP
12.5000 mg | Freq: Four times a day (QID) | RECTAL | Status: DC | PRN
Start: 2021-12-31 — End: 2022-01-26

## 2021-12-31 MED ORDER — DIPHENHYDRAMINE HCL 12.5 MG/5ML PO ELIX: 12.5000 mg | ORAL_SOLUTION | Freq: Four times a day (QID) | ORAL | Status: DC | PRN

## 2021-12-31 MED ORDER — PANTOPRAZOLE SODIUM 40 MG PO TBEC
40.0000 mg | DELAYED_RELEASE_TABLET | Freq: Every day | ORAL | Status: DC
  Administered 2022-01-01 – 2022-01-08 (×8): 40 mg via ORAL
  Filled 2021-12-31 (×8): qty 1

## 2021-12-31 MED ORDER — GUAIFENESIN-DM 100-10 MG/5ML PO SYRP: 5.0000 mL | ORAL_SOLUTION | Freq: Four times a day (QID) | ORAL | Status: DC | PRN

## 2021-12-31 NOTE — Progress Notes (Signed)
Inpatient Rehab Admissions Coordinator:  ?There is a bed available for pt in CIR today. Dr. Randie Heinz is aware and in agreement. Pt, pt's daughter Tammi Sou, pt's wife , pt's son, NSG, and TOC are aware ? ?Wolfgang Phoenix, MS, CCC-SLP ?Admissions Coordinator ?[phone] ? ?

## 2021-12-31 NOTE — Care Management Important Message (Signed)
Important Message ? ?Patient Details  ?Name: Allen Arias ?MRN: [id number] ?Date of Birth: May 02, 1941 ? ? ?Medicare Important Message Given:  Yes ? ? ? ? ?[name] ?12/31/2021, 10:52 AM ?

## 2021-12-31 NOTE — Progress Notes (Signed)
Inpatient Rehabilitation Admission Medication Review by a Pharmacist ? ?A complete drug regimen review was completed for this patient to identify any potential clinically significant medication issues. ? ?High Risk Drug Classes Is patient taking? Indication by Medication  ?Antipsychotic Yes Compazine- N/V  ?Anticoagulant No   ?Antibiotic No   ?Opioid Yes Percocet- acute pain  ?Antiplatelet No   ?Hypoglycemics/insulin No   ?Vasoactive Medication No   ?Chemotherapy No   ?Other Yes Zocor- HLD ?Protonix- GERD ?Trazodone- sleep  ? ? ? ?Type of Medication Issue Identified Description of Issue Recommendation(s)  ?Drug Interaction(s) (clinically significant) ?    ?Duplicate Therapy ?    ?Allergy ?    ?No Medication Administration End Date ?    ?Incorrect Dose ?    ?Additional Drug Therapy Needed ?    ?Significant med changes from prior encounter (inform family/care partners about these prior to discharge).    ?Other ? PTA meds ?Vasotec ?Melatonin ?Flomax ?Vitamin D and C Restart PTA meds when and if clinically necessary or at time of discharge, if warranted  ? ? ?Clinically significant medication issues were identified that warrant physician communication and completion of prescribed/recommended actions by midnight of the next day:  No ? ?Time spent performing this drug regimen review (minutes):  30 ? ? ?Adit Riddles BS, PharmD, BCPS ?Clinical Pharmacist ?01/01/2022 4:28 AM ? ?Contact: [phone] after 3 PM ? ?"Be curious, not judgmental..." -Debbora Dus ?

## 2021-12-31 NOTE — Progress Notes (Signed)
Orders received to discharge patient to CIR.  Telemetry monitor removed and CCMD notified.  PIV access x3 removed.  Report called to receiving RN in [id number]. ?

## 2021-12-31 NOTE — Progress Notes (Addendum)
[facility] ? ?Subjective  - no new complaints ? ? ?Objective ?130/72 ?88 ?98 ?F (36.7 ?C) (Oral) ?18 ?97% ? ?Intake/Output Summary (Last 24 hours) at 12/31/2021 7408 ?Last data filed at 12/31/2021 0400 ?Gross per 24 hour  ?Intake 920 ml  ?Output 900 ml  ?Net 20 ml  ? ? ?B groins soft with out hematoma ?Sitting up in bed, minimal left toe motor, sensation intact B LE, doppler signal DP B, feet/legs warm to sensation. ?B LE loss of mobility ?Lungs non labored breathing ? ? ?Assessment/Planning: ?81 y.o. male is s/p EVAR, bilateral common femoral thromboendarterectomy; left popliteal / TP trunk thromboendarterectomy 12/26/21 complicated by postoperative lower extremity weakness  5 Days Post-Op  ? ?Well perfused B LE ?Loss of LE motor due to MR demonstrating cord infarct ?Patients family in room used as interpreter ?Pending rehab ? ?Mosetta Pigeon ?12/31/2021 ?8:12 AM ?-- ? ?Laboratory ?Lab Results: ?Recent Labs  ?  12/28/21 ?1338 12/29/21 ?0020  ?WBC 8.6 9.5  ?HGB 10.7* 10.0*  ?HCT 31.0* 29.7*  ?PLT 55* 56*  ? ?BMET ?Recent Labs  ?  12/29/21 ?0020  ?NA 135  ?K 3.9  ?CL 107  ?CO2 22  ?GLUCOSE 104*  ?BUN 21  ?CREATININE 0.88  ?CALCIUM 7.8*  ? ? ?COAG ?Lab Results  ?Component Value Date  ? INR 1.4 (H) 12/27/2021  ? INR 1.8 (H) 12/26/2021  ? INR 2.1 (H) 12/26/2021  ? ?No results found for: PTT ? ?I have independently interviewed and examined patient and agree with PA assessment and plan above.  His groins are soft his bilateral feet are well-perfused incisions are all healing well.  I have again had discussion with the patient and his family regarding his outcomes and requirement for rehab.  They demonstrate good understanding. ? ?Oren Barella C. Randie Heinz, MD ?[facility] ?Office: [phone] ?Pager: [phone] ? ? ?

## 2021-12-31 NOTE — H&P (Shared)
? ? ?Physical Medicine and Rehabilitation Admission H&P ? ?  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 ? 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 He was admitted on  04/21/213 for TVAR, R-CFA endarterectomy and LLE thrombectomy by Dr. Donzetta Matters. 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 Acute on chronic Thrombocytopenia treated with FFP, flomax d/c and Levo added to help maintain BP.  ? ?Dr. 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 Dr. Annette Stable was consulted for input on cervical stenosis and question incomplete SCI due to positioning and periop perfusion changes but patient with intact sensory/motor exam BUE and may require decompression once medically stable.   ? ?He had significant drop in H/H to 5.7/17.9 and CTA abdomen/BLE  04/23 was negative for endoleak, showed severe irregular thoracic and abdominal aortic atherosclerosis with large burden of mural thrombus, small hematoma R-CFA, large hematoma L-PA and ill defined muscle body hematoma left adductor and hamstring musculature. He was transfused with 3 units PRBC with improvement in H/H. MRI thoracic spine repeated 04/24 and revealed interval edematous swelling and narrowing of central cord at level of T12 and L1 c/w spinal cord ischemia/infarct. Foley remains in place as failed voiding trial. He has had issues with bowel incontinence. 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 CIR recommended due to functional decline.  ? ? ?Review of Systems  ?Constitutional:  Negative for chills and fever.  ?HENT:  Positive for hearing loss (mild?). Negative for tinnitus.   ?Eyes:  Negative for blurred vision.  ?Respiratory:  Negative for shortness of breath.   ?Cardiovascular:  Negative for chest pain.  ?Gastrointestinal:  Positive for constipation (usually went every 4-5 days/took miralax occasionally).  ?     Appetite has been poor.   ?Genitourinary:   ?     Foley in place  ?Musculoskeletal:  Negative for myalgias.  ?Neurological:  Positive for focal weakness. Negative for dizziness and sensory change.  ?Psychiatric/Behavioral:  The patient is nervous/anxious (due to SCI/worries about his wife) and has insomnia (acute on chronic).   ? ? ?Past Medical History:  ?Diagnosis Date  ? AAA (abdominal aortic aneurysm) ([facility])   ? Hypertension   ? ? ?Past Surgical History:  ?Procedure Laterality Date  ? ABDOMINAL AORTIC ENDOVASCULAR STENT GRAFT N/A 12/26/2021  ? Procedure: ABDOMINAL AORTIC ENDOVASCULAR STENT GRAFT;  Surgeon: Waynetta Sandy, MD;  Location: [facility];  Service: Vascular;  Laterality: N/A;  ? CARPAL TUNNEL RELEASE    ? ENDARTERECTOMY FEMORAL Right 12/26/2021  ? Procedure: RIGHT ENDARTERECTOMY FEMORAL;  Surgeon: Waynetta Sandy, MD;  Location: [place];  Service: Vascular;  Laterality: Right;  ? ENDARTERECTOMY POPLITEAL Left 12/26/2021  ? Procedure: LEFT ENDARTERECTOMY POPLITEAL WITH ANGIOPLASTY USING XENOSURE BIOPATCH (1cmX6cm);  Surgeon: Waynetta Sandy, MD;  Location: [facility];  Service: Vascular;  Laterality: Left;  ? EYE SURGERY    ?  PATCH ANGIOPLASTY Bilateral 12/26/2021  ? Procedure: PATCH ANGIOPLASTY USING XENOSURE BIOLOGIC PATCH (1cmx6cm);  Surgeon: Waynetta Sandy, MD;  Location: [facility];  Service: Vascular;  Laterality: Bilateral;  ? THROMBECTOMY FEMORAL ARTERY Bilateral 12/26/2021  ? Procedure: BILATERAL FEMORAL  ARTERY THROMBECTOMY;  Surgeon: Waynetta Sandy, MD;  Location: [facility];  Service: Vascular;  Laterality: Bilateral;  ? ULTRASOUND GUIDANCE FOR VASCULAR ACCESS Bilateral 12/26/2021  ? Procedure: ULTRASOUND GUIDANCE FOR VASCULAR ACCESS;  Surgeon: Waynetta Sandy, MD;  Location: [facility];  Service: Vascular;  Laterality: Bilateral;  ? ? ?Family History  ?Problem Relation Age of Onset  ? Gallbladder disease Mother   ?     cholangiocarcinoma  ? Gastric cancer Father   ? ? ?Social History: Married. Has been in [place] for 20 years. 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 He used to smoke cigarettes--1/2 PPD for about 15-20 years. He has never used smokeless tobacco. He does not use any alcohol or drugs.  ? ?Allergies: No Active Allergies ? ? ?Medications Prior to Admission  ?Medication Sig Dispense Refill  ? Ascorbic Acid (VITAMIN C PO) Take 1 capsule by mouth daily.    ? Aspirin Buf,CaCarb-MgCarb-MgO, 81 MG TABS Take 81 mg by mouth as needed for pain.    ? enalapril (VASOTEC) 10 MG tablet Take 20 mg by mouth daily.    ? Melatonin 10 MG CAPS Take 10 mg by mouth at bedtime.    ? Multiple Vitamin (MULTIVITAMIN WITH MINERALS) TABS tablet Take 1 tablet by mouth daily.    ? simvastatin (ZOCOR) 20 MG tablet Take 20 mg by mouth at bedtime.    ? tamsulosin (FLOMAX) 0.4 MG CAPS capsule Take 0.8 mg by mouth daily.    ? traZODone (DESYREL) 50 MG tablet Take 50 mg by mouth at bedtime as needed for sleep.    ? VITAMIN D PO Take 1 capsule by mouth daily.    ? amLODipine (NORVASC) 5 MG tablet Take 5 mg by mouth daily. (Patient not taking: Reported on 12/18/2021)    ? dutasteride (AVODART) 0.5 MG capsule Take 0.5 mg by mouth daily. (Patient not taking: Reported on 12/18/2021)    ? ? ? ? ?Home: ?Home Living ?Family/patient expects to be discharged to:: Private residence ?Living Arrangements: Spouse/significant other ?Available Help at Discharge: Family, Available PRN/intermittently ?Type of Home: Other(Comment) (townhouse) ?Home Access:  Level entry ?Home Layout: Two level ?Alternate Level Stairs-Number of Steps: flight ?Alternate Level Stairs-Rails: Left ?Bathroom Shower/Tub: Tub/shower unit ?Bathroom Toilet: Standard ?Bathroom Accessibility: Yes ?Additional Comments: Living room and kitchen are downstairs, bedroom upstairs ? Lives With: Spouse ?  ?Functional History: ?Prior Function ?Prior Level of Function : Independent/Modified Independent ?Mobility Comments: Enjoys walking ? ?Functional Status:  ?Mobility: ?Bed Mobility ?Overal bed mobility: Needs Assistance ?Bed Mobility: Supine to Sit ?Supine to sit: Mod assist, +2 for physical assistance ?General bed mobility comments: Assist for BLE's off edge of bed, cues for reaching with RUE for bed rail, assist for trunk elevation to upright, posterior lean Pt vocalizes fear of falling ?Transfers ?Overall transfer level: Needs assistance ?Equipment used: None ?Transfers: Bed to chair/wheelchair/BSC ?Sit to Stand: Mod assist, Max assist, +2 physical assistance, +2 safety/equipment ?Bed to/from chair/wheelchair/BSC transfer type:: Lateral/scoot transfer ?Squat pivot transfers: Max assist, +2 physical assistance ?Transfer via Lift Equipment: Stedy ?General transfer comment: MaxA + 2 to transfer towards right side. Multimodal cues for head/hip relationship, anterior weight shift, use of arms ?  ?  ? ?ADL: ?ADL ?Overall ADL's : Needs assistance/impaired ?  Eating/Feeding: Set up, Sitting ?Grooming: Set up, Wash/dry hands, Wash/dry face, Sitting ?Grooming Details (indicate cue type and reason): supported seat in the recliner ?Upper Body Bathing: Moderate assistance, Sitting ?Upper Body Bathing Details (indicate cue type and reason): for back sitting EOB ?Lower Body Bathing: Maximal assistance ?Upper Body Dressing : Moderate assistance ?Lower Body Dressing: Maximal assistance ?Lower Body Dressing Details (indicate cue type and reason): able to lift L leg to assist with donning of socks ?Toilet Transfer: Maximal  assistance, +2 for physical assistance, +2 for safety/equipment (stedy) ?Toilet Transfer Details (indicate cue type and reason): simulated through recliner transfer ?Toileting- Clothing Manipulation and

## 2021-12-31 NOTE — Progress Notes (Signed)
Occupational Therapy Treatment ?Patient Details ?Name: Allen Arias ?MRN: [id number] ?DOB: 11-30-1940 ?Today's Date: 12/31/2021 ? ? ?History of present illness Allen Arias is a 81 y.o. 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 Post-op he developed BLE dense weakness. 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  Repeat MRI 4/24 showing New T2 hyperintensity and swelling of the central cord at the  level of T12 and L1, compatible with ischemia/infarct in this setting. PMH includes HTN, carpal tunnel release, and eye sx. ?  ?OT comments ? Patient seen with [facility] interpreter. Patient received in supine and agreeable to OT/PT session. Patient required mod assist +2 to get from supine to sitting EOB. Patient educated on sliding board use and mod assist +2 to transfer to wheelchair. Education provided on wheelchair mobility with min to mod assist to perform. Patient was assisted back to bed with sliding board and mod assist +2. Patient is expected to discharge to AIR today for continued OT for increased independence with functional transfers and self care.   ? ?Recommendations for follow up therapy are one component of a multi-disciplinary discharge planning process, led by the attending physician.  Recommendations may be updated based on patient status, additional functional criteria and insurance authorization. ?   ?Follow Up Recommendations ? Acute inpatient rehab (3hours/day)  ?  ?Assistance Recommended at Discharge Frequent or constant Supervision/Assistance  ?Patient can return home with the following ? Two people to help with walking and/or transfers;A lot of help with bathing/dressing/bathroom;Assistance with cooking/housework;Assistance with  feeding;Assist for transportation;Help with stairs or ramp for entrance ?  ?Equipment Recommendations ? BSC/3in1;Tub/shower bench;Wheelchair (measurements OT);Wheelchair cushion (measurements OT);Other (comment)  ?  ?Recommendations for Other Services   ? ?  ?Precautions / Restrictions Precautions ?Precautions: Fall ?Restrictions ?Weight Bearing Restrictions: No  ? ? ?  ? ?Mobility Bed Mobility ?Overal bed mobility: Needs Assistance ?Bed Mobility: Supine to Sit, Sit to Supine ?  ?  ?Supine to sit: Mod assist, +2 for physical assistance ?Sit to supine: Mod assist, +2 for physical assistance ?  ?General bed mobility comments: verbal and tactile cues for hand placement with assistance needed with BLEs ?  ? ?Transfers ?Overall transfer level: Needs assistance ?Equipment used: Sliding board ?Transfers: Bed to chair/wheelchair/BSC ?  ?  ?  ?  ?  ? Lateral/Scoot Transfers: Mod assist, +2 physical assistance, With slide board ?General transfer comment: eduction on board placement and hand positioning to assist with transfers. Patient demonstrated good forward flexion during tranfer ?  ?  ?Balance Overall balance assessment: Needs assistance ?Sitting-balance support: No upper extremity supported, Feet supported ?Sitting balance-Leahy Scale: Fair ?Sitting balance - Comments: able to keep balance on EOB and with slide board transfer ?Postural control: Posterior lean ?  ?  ?  ?  ?  ?  ?  ?  ?  ?  ?  ?  ?  ?  ?   ? ?ADL either performed or assessed with clinical judgement  ? ?ADL   ?  ?  ?  ?  ?  ?  ?  ?  ?  ?  ?  ?  ?  ?  ?  ?  ?  ?  ?  ?  ?  ? ?Extremity/Trunk Assessment   ?  ?  ?  ?  ?  ? ?  Vision   ?  ?  ?Perception   ?  ?Praxis   ?  ? ?Cognition Arousal/Alertness: Awake/alert ?Behavior During Therapy: [facility] for tasks assessed/performed, Flat affect ?Overall Cognitive Status: Difficult to assess ?  ?  ?  ?  ?  ?  ?  ?  ?  ?  ?  ?  ?  ?  ?  ?  ?General Comments: Utilized [employer] interpreter ?  ?  ?   ?Exercises Exercises: Other  exercises ?Other Exercises ?Other Exercises: wheelchair mobility performed with education and min to mod assist ? ?  ?Shoulder Instructions   ? ? ?  ?General Comments    ? ? ?Pertinent Vitals/ Pain       Pain Assessment ?Pain Assessment: Faces ?Faces Pain Scale: Hurts a little bit ?Pain Location: groin area ?Pain Descriptors / Indicators: Burning, Discomfort, Grimacing ?Pain Intervention(s): Monitored during session ? ?Home Living   ?  ?  ?  ?  ?  ?  ?  ?  ?  ?  ?  ?  ?  ?  ?  ?  ?  ?  ? ?  ?Prior Functioning/Environment    ?  ?  ?  ?   ? ?Frequency ? Min 2X/week  ? ? ? ? ?  ?Progress Toward Goals ? ?OT Goals(current goals can now be found in the care plan section) ? Progress towards OT goals: Progressing toward goals ? ?Acute Rehab OT Goals ?Patient Stated Goal: rehab ?OT Goal Formulation: With patient/family ?Time For Goal Achievement: 01/10/22 ?Potential to Achieve Goals: Good ?ADL Goals ?Pt Will Perform Grooming: with modified independence;sitting ?Pt Will Perform Upper Body Bathing: with modified independence;sitting ?Pt Will Perform Lower Body Bathing: with supervision;with caregiver independent in assisting;with adaptive equipment;sitting/lateral leans ?Pt Will Perform Upper Body Dressing: with modified independence;sitting ?Pt Will Perform Lower Body Dressing: with min guard assist;with caregiver independent in assisting;sit to/from stand;with adaptive equipment ?Pt Will Transfer to Toilet: with min assist;squat pivot transfer;bedside commode ?Pt Will Perform Toileting - Clothing Manipulation and hygiene: with min assist;sitting/lateral leans ?Additional ADL Goal #1: Pt will perform bed mobility at min A prior to engaging in ADL  ?Plan Discharge plan remains appropriate;Frequency remains appropriate   ? ?Co-evaluation ? ? ? PT/OT/SLP Co-Evaluation/Treatment: Yes ?Reason for Co-Treatment: For patient/therapist safety;To address functional/ADL transfers ?  ?OT goals addressed during session:  Strengthening/ROM ?  ? ?  ?AM-PAC OT "6 Clicks" Daily Activity     ?Outcome Measure ? ? Help from another person eating meals?: A Little ?Help from another person taking care of personal grooming?: A Little ?Help from another person toileting, which includes using toliet, bedpan, or urinal?: A Lot ?Help from another person bathing (including washing, rinsing, drying)?: A Lot ?Help from another person to put on and taking off regular upper body clothing?: A Little ?Help from another person to put on and taking off regular lower body clothing?: Total ?6 Click Score: 14 ? ?  ?End of Session Equipment Utilized During Treatment: Gait belt;Other (comment) (sliding board) ? ?OT Visit Diagnosis: Unsteadiness on feet (R26.81);Other abnormalities of gait and mobility (R26.89);Other symptoms and signs involving the nervous system (R29.898) ?  ?Activity Tolerance Patient tolerated treatment well ?  ?Patient Left in bed;with call bell/phone within reach;with family/visitor present ?  ?Nurse Communication Mobility status ?  ? ?   ? ?Time: 8119-14781035-1104 ?OT Time Calculation (min): 29 min ? ?Charges: OT General Charges ?$OT Visit: 1 Visit ?OT Treatments ?$Therapeutic Activity: 8-22 mins ? ?  Alfonse Flavors, OTA ?Acute Rehabilitation Services  ?Pager [phone] ?Office [phone] ? ? ?Mekayla Soman Jeannett Senior ?12/31/2021, 1:09 PM ?

## 2021-12-31 NOTE — Progress Notes (Signed)
Inpatient Rehabilitation Admission Medication Review by a Pharmacist ? ?A complete drug regimen review was completed for this patient to identify any potential clinically significant medication issues. ? ?High Risk Drug Classes Is patient taking? Indication by Medication  ?Antipsychotic Yes Compazine- N/V  ?Anticoagulant Yes Lovenox- VTE prophylaxis  ?Antibiotic No   ?Opioid Yes Percocet- acute pain  ?Antiplatelet No   ?Hypoglycemics/insulin No   ?Vasoactive Medication No   ?Chemotherapy No   ?Other Yes Zocor- HLD ?Protonix- GERD ?Trazodone- sleep  ? ? ? ?Type of Medication Issue Identified Description of Issue Recommendation(s)  ?Drug Interaction(s) (clinically significant) ?    ?Duplicate Therapy ?    ?Allergy ?    ?No Medication Administration End Date ?    ?Incorrect Dose ?    ?Additional Drug Therapy Needed ?    ?Significant med changes from prior encounter (inform family/care partners about these prior to discharge).    ?Other ? PTA meds ?Vasotec ?Melatonin ?Flomax ?Vitamin D and C Restart PTA meds when and if clinically necessary or at time of discharge, if warranted  ? ? ?Clinically significant medication issues were identified that warrant physician communication and completion of prescribed/recommended actions by midnight of the next day:  No ? ?Time spent performing this drug regimen review (minutes):  30 ? ? ?Earle Reome BS, PharmD, BCPS ?Clinical Pharmacist ?12/31/2021 12:37 PM ? ?Contact: [phone] after 3 PM ? ?"Be curious, not judgmental..." -Debbora Dus ?

## 2021-12-31 NOTE — TOC Transition Note (Signed)
Transition of Care (TOC) - CM/SW Discharge Note ?Donn Pierini Charity fundraiser, BSN ?Transitions of Care ?Unit 4E- RN Case Manager ?See Treatment Team for direct phone #  ? ? ?Patient Details  ?Name: Allen Arias ?MRN: [id number] ?Date of Birth: April 20, 1941 ? ?Transition of Care (TOC) CM/SW Contact:  ?Zenda Alpers, Lenn Sink, RN ?Phone Number: ?12/31/2021, 2:23 PM ? ? ?Clinical Narrative:    ?Notified this am by [facility] INPT rehab AC-Lauren M. -pt has bed available today and plan for pt to admit to INPT rehab later this afternoon. Pt has been cleared by MD and stable for transition to INPT rehab.  ? ?Noted previous CM note regarding PCS paperwork and need for signature- have spoken with Narda Amber PA- however she is in the office today, reached out to C. Baglia PA w/ vascular who is willing to come fill out and sign the PCS paperwork- once signed- CM will fax to [facility] in [place] at [phone] to initiate process for [facility] PCS services. ? ? ?Final next level of care: IP Rehab Facility ?Barriers to Discharge: Barriers Resolved ? ? ?Patient Goals and CMS Choice ?Patient states their goals for this hospitalization and ongoing recovery are:: return home with wife ?CMS Medicare.gov Compare Post Acute Care list provided to:: Patient ?Choice offered to / list presented to : Adult Children ? ?Discharge Placement ?  ?           ? INPT rehab- [facility] ?  ?  ?  ? ?Discharge Plan and Services ?In-house Referral: Clinical Social Work ?Discharge Planning Services: CM Consult ?Post Acute Care Choice: NA          ?DME Arranged: N/A ?DME Agency: NA ?  ?  ?  ?HH Arranged: NA ?HH Agency: NA ?  ?  ?  ? ?Social Determinants of Health (SDOH) Interventions ?  ? ? ?Readmission Risk Interventions ? ?  12/31/2021  ? 11:53 AM  ?Readmission Risk Prevention Plan  ?Medication Screening Complete  ?Transportation Screening Complete  ? ? ? ? ? ?

## 2021-12-31 NOTE — Progress Notes (Signed)
Signed    ? ?   ?   ?   ?   ?   ?   ?   ?   ?   ?   ?   ?   ?   ?   ?   ?   ?   ?   ?   ?   ?   ?   ?   ?   ?   ?   ?   ?   ?   ?   ?   ?   ?   ?   ?   ?   ?   ?   ?   ?   ?   ?   ?   ?   ?   ?   ?   ?   ?   ?   ?   ?   ?   ?   ?   ?   ?   ?   ?   ?   ?   ?   ?   ?   ?   ?   ?   ?   ?   ?   ?   ?   ?   ?   ?   ?   ?   ?   ?   ?   ?   ?   ?   ?   ?   ?   ?   ?   ?   ?   ?   ?   ?   ?   ?   ?   ?   ?   ?   ?   ?   ?   ?   ?   ?   ?   ?   ?   ?   ?   ?   ?   ?   ?   ?   ?   ?   ?   ?   ?   ?   ?   ?   ?   ?   ?   ?   ?   ?   ?   ?   ?   ?   ?   ?   ?   ?   ?   ?   ?   ?   ?   ?   ?   ?   ?   ?   ?   ?   ?   ?   ?   ?   ?   ?   ?   ?   ?   ?   ?   ?   ?   ?   ?   ?   ?   ?PMR Admission Coordinator Pre-Admission Assessment ?  ?Patient: Allen Arias is an 81 y.o., male ?MRN: [id number] ?DOB: Jan 10, 1941 ?Height: 5\' 4"  (162.6 cm) ?Weight: 50 kg ?  ?Insurance Information ?HMO:     PPO:      PCP:      IPA:      80/20: yes     OTHER:  ?PRIMARY: Medicare A & B      Policy#:      Subscriber: patient ?CM Name:       Phone#:      Fax#:  ?  Pre-Cert#:       Employer:  ?Benefits:  Phone #: verified eligibility via [employer] on 12/30/21     Name:  ?Eff. Date: Part A & B effective 12/06/09     Deduct: $1,600      Out of Pocket Max: NA      Life Max: NA ?CIR: 100% coverage      SNF: 100% for days 1-20, 80% days 21-100 ?Outpatient: 80% coverage     Co-Pay: 20% ?Home Health: 100% coverage      Co-Pay:  ?DME: 80% coverage     Co-Pay: 20% ?Providers: pt's choice ?SECONDARY: Medicaid of [place]      Policy#: [id number] t     Phone#: [phone] ?  ?Financial Counselor:       Phone#:  ?  ?The ?Data Collection Information Summary? for patients in Inpatient Rehabilitation Facilities with attached ?Privacy Act Statement-Health Care Records? was provided and verbally reviewed with: Family ?  ?Emergency Contact Information ?Contact Information   ?  ?  Name Relation Home Work Mobile  ?  Trixie RudeHwang, [name] Daughter     [phone]  ?  Jillyn HiddenLee,Kyung Sook  Spouse [phone]      ?  ?   ?  ?  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 Pt presented to hospital on 12/26/21 for CT scan and lower extremity duplexes to evaluate for popliteal aneurysms. Aneurysm appeared to be greater than 5.5 cm. Pt underwent an Endovascular aortic aneurysm repair (EVAR) on 12/26/21. Post-op pt had decreased sensation and motor strength in bilateral lower extremities concerning for spinal cord ischemia. Thoracic MRI showed no signs of ischemia and no evidence of significant stenosis.  MRI cervical spine showed evidence of significant multilevel cervical disc degeneration with moderately severe stenosis and early anterior listhesis at C4-5. Pt has significant spondylosis and broad-based disc bulging causing moderately severe stenosis at C5-6. Also has spondylosis and disc protrusion at C6-7 with severe stenosis; no evidence of definite signal abnormality. Pt had hemoglobin of 6.7; transfused 3 units PRBC. CTA showed left calf hematoma. No exploration or evacuation warranted at that time. 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 Cervical surgery not warranted at this time.  Therapy evaluations completed and CIR recommended d/t pt's deficits with functional mobility and inability to complete ADLs independently. ?  ?[facility] by the rehabilitation admission coordinator and physician. ?  ?Past Medical History  ?    ?Past Medical History:  ?Diagnosis Date  ? AAA (abdominal aortic aneurysm) (HCC)    ? Hypertension    ?  ?  ?Has the patient had major surgery during 100 days prior to admission? Yes ?  ?Family History   ?family history includes Gallbladder disease in his mother; Gastric cancer in his father. ?  ?Current Medications ?  ?Current Facility-Administered  Medications:  ?  0.9 %  sodium chloride infusion (Manually program via Guardrails IV Fluids), , Intravenous, Once, Emilie RutterEveland, Matthew, PA-C ?  0.9 %  sodium chloride infusion, 500 mL, Intravenous, Once PRN, Emilie RutterEveland, Matthew, PA-C ?  0.9 %  sodium chloride infusion, 500 mL, Intravenous, Once PRN, Emilie RutterEveland, Matthew, PA-C ?  0.9 %  sodium chloride infusion, , Intravenous, Continuous, Eveland, Matthew, PA-C, Stopped at 12/27/21 1000 ?  0.9 %  sodium chloride infusion, 250 mL, Intravenous, Continuous, Eveland,  Josephine Igo, Last Rate: 10 mL/hr at 12/26/21 2353, 250 mL at 12/26/21 2353 ?  0.9 %  sodium chloride infusion, 250 mL, Intravenous, PRN, Emilie Rutter, PA-C, Last Rate: 10 mL/hr at 12/28/21 2143, 250 mL at 12/28/21 2143 ?  acetaminophen (TYLENOL) tablet 325-650 mg, 325-650 mg, Oral, Q4H PRN, 650 mg at 12/30/21 1322 **OR** acetaminophen (TYLENOL) suppository 325-650 mg, 325-650 mg, Rectal, Q4H PRN, Emilie Rutter, PA-C ?  alum & mag hydroxide-simeth (MAALOX/MYLANTA) 200-200-20 MG/5ML suspension 15-30 mL, 15-30 mL, Oral, Q2H PRN, Eveland, Matthew, PA-C ?  bisacodyl (DULCOLAX) EC tablet 5 mg, 5 mg, Oral, Daily PRN, Emilie Rutter, PA-C ?  Chlorhexidine Gluconate Cloth 2 % PADS 6 each, 6 each, Topical, Q0600, Emilie Rutter, PA-C, 6 each at 12/28/21 1610 ?  docusate sodium (COLACE) capsule 100 mg, 100 mg, Oral, Daily, Emilie Rutter, PA-C, 100 mg at 12/30/21 1024 ?  guaiFENesin-dextromethorphan (ROBITUSSIN DM) 100-10 MG/5ML syrup 15 mL, 15 mL, Oral, Q4H PRN, Emilie Rutter, PA-C ?  magnesium sulfate IVPB 2 g 50 mL, 2 g, Intravenous, Daily PRN, Emilie Rutter, PA-C ?  morphine (PF) 2 MG/ML injection 2-5 mg, 2-5 mg, Intravenous, Q1H PRN, Emilie Rutter, PA-C, 2 mg at 12/26/21 1521 ?  ondansetron (ZOFRAN) injection 4 mg, 4 mg, Intravenous, Q6H PRN, Emilie Rutter, PA-C, 4 mg at 12/26/21 1808 ?  oxyCODONE-acetaminophen (PERCOCET/ROXICET) 5-325 MG per tablet 1-2 tablet, 1-2 tablet, Oral, Q4H PRN, Emilie Rutter, PA-C, 1 tablet at 12/27/21 9604 ?  pantoprazole (PROTONIX) EC tablet 40 mg, 40 mg, Oral, Daily, Emilie Rutter, PA-C, 40 mg at 12/31/21 1024 ?  phenol (CHLORASEPTIC) mouth spray 1 spray, 1 spray, Mouth/Throat, PRN, Eveland, Matthew, PA-C ?  potassium chloride SA (KLOR-CON M) CR tablet 20-40 mEq, 20-40 mEq, Oral, Daily PRN, Emilie Rutter, PA-C ?  senna-docusate (Senokot-S) tablet 1 tablet, 1 tablet, Oral, QHS PRN, Emilie Rutter, PA-C ?  simvastatin (ZOCOR) tablet 20 mg, 20 mg, Oral, QHS, Eveland, Matthew, PA-C, 20 mg at 12/30/21 2104 ?  sodium chloride flush (NS) 0.9 % injection 3 mL, 3 mL, Intravenous, Q12H, Eveland, Matthew, PA-C, 3 mL at 12/31/21 1032 ?  sodium chloride flush (NS) 0.9 % injection 3 mL, 3 mL, Intravenous, PRN, Emilie Rutter, PA-C ?  sodium phosphate (FLEET) 7-19 GM/118ML enema 1 enema, 1 enema, Rectal, Once PRN, Emilie Rutter, PA-C ?  traZODone (DESYREL) tablet 50 mg, 50 mg, Oral, QHS PRN, Emilie Rutter, PA-C, 50 mg at 12/30/21 2103 ?  ?Patients Current Diet:  ?Diet Order   ?  ?         ?    Diet Heart Room service appropriate? Yes; Fluid consistency: Thin  Diet effective now       ?  ?  ?   ?  ?  ?   ?  ?  ?Precautions / Restrictions ?Precautions ?Precautions: Fall ?Restrictions ?Weight Bearing Restrictions: No  ?  ?Has the patient had 2 or more falls or a fall with injury in the past year? No ?  ?Prior Activity Level ?Community (5-7x/wk): drives, gets out of house daily ?  ?Prior Functional Level ?Self Care: Did the patient need help bathing, dressing, using the toilet or eating? Independent ?  ?Indoor Mobility: Did the patient need assistance with walking from room to room (with or without device)? Independent ?  ?Stairs: Did the patient need assistance with internal or external stairs (with or without device)? Independent ?  ?Functional Cognition: Did the patient need help planning regular tasks such as shopping or remembering  to take medications? Independent ?   ?Patient Information ?Are you of Hispanic, Latino/a,or Spanish origin?: A. No, not of Hispanic, Latino/a, or Spanish origin ?What is your race?: [place]. [place] ?Do you need or want an interpreter to communicate with a

## 2021-12-31 NOTE — Progress Notes (Signed)
Physical Therapy Treatment ?Patient Details ?Name: Allen AlertYong Lizer ?MRN: [id number] ?DOB: 08/28/1941 ?Today's Date: 12/31/2021 ? ? ?History of Present Illness Allen Arias is a 81 y.o. 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 Post-op he developed BLE dense weakness. 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  Repeat MRI 4/24 showing New T2 hyperintensity and swelling of the central cord at the  level of T12 and L1, compatible with ischemia/infarct in this setting. PMH includes HTN, carpal tunnel release, and eye sx. ? ?  ?PT Comments  ? ? Pt received in supine, pleasantly cooperative and agreeable to therapy session with emphasis on bed mobility, slide board transfer from bed<>wheelchair and wheelchair mobility training. Collaborative session with OT due to pt multidisciplinary therapy needs and pt very motivated and with excellent participation and fair carryover of instructions for wheelchair mobility progression, needing up to modA initially for proper hand placement/technique and for safety while propelling chair out of/back into room. Pt needing up to +2 modA for slide board transfers to/from wheelchair<>bed and improved technique from previous session but still needing increased time and mod cues for hand placement and body mechanics. Pt denies dizziness or lightheadedness during functional mobility tasks and VSS per chart review. Pt continues to benefit from PT services to progress toward functional mobility goals.   ?Recommendations for follow up therapy are one component of a multi-disciplinary discharge planning process, led by the attending physician.  Recommendations may be updated based on patient status, additional functional criteria and  insurance authorization. ? ?Follow Up Recommendations ? Acute inpatient rehab (3hours/day) ?  ?  ?Assistance Recommended at Discharge Frequent or constant Supervision/Assistance  ?Patient can return home with the following Two people to help with walking and/or transfers;A lot of help with bathing/dressing/bathroom;Help with stairs or ramp for entrance ?  ?Equipment Recommendations ? Wheelchair (measurements PT);Wheelchair cushion (measurements PT);BSC/3in1  ?  ?Recommendations for Other Services Rehab consult ? ? ?  ?Precautions / Restrictions Precautions ?Precautions: Fall ?Precaution Comments: foley ?Restrictions ?Weight Bearing Restrictions: No  ?  ? ?Mobility ? Bed Mobility ?Overal bed mobility: Needs Assistance ?Bed Mobility: Rolling, Sidelying to Sit, Sit to Sidelying ?Rolling: Mod assist ?Sidelying to sit: Mod assist, +2 for physical assistance ?  ?  ?Sit to sidelying: Mod assist, +2 for physical assistance ?General bed mobility comments: verbal and tactile cues for hand placement with assistance needed with BLEs, increased time to initiate/perform ?  ? ?Transfers ?Overall transfer level: Needs assistance ?Equipment used: Sliding board ?Transfers: Bed to chair/wheelchair/BSC ?  ?  ?  ?  ?  ? Lateral/Scoot Transfers: Mod assist, +2 physical assistance, With slide board ?General transfer comment: Education on board placement and hand positioning to assist with transfers. Patient demonstrated good forward flexion during transfer but needs heavy cues and  hand over hand assist for improved body mechanics and technique. ?  ? ?  ?  ?  ? ? ?Wheelchair Mobility ?Wheelchair Mobility ?Wheelchair mobility: Yes ?Wheelchair propulsion: Both upper extremities ?Wheelchair parts: Needs assistance ?Distance: 35 ?Wheelchair Assistance Details (indicate cue type and reason): pt needing min to modA to propel/turn chair with dense cues and some hand over hand assist for improved technique to propel chair and to turn. Possibly  some increased confusion due to language barrier (audio  interpreter having difficulty translating quickly enough so pt son also assisting to translate). WC used was a bit too wide for him so pt having difficulty with longer "push" movements with wheel rim, will likely do better with accurately sized WC (pt is 64ft 4" and 110 lb likely to need max of 18" wc possibly narrower). Pt able to carryover instruction and propel chair out of room, into hallway then turning around and propelling back into room. Also instructed pt/family on WC brakes and leg rests, pt needing manual assist to place BLE safely on leg rests but he did attempt to assist with this. ? ?  ?Balance Overall balance assessment: Needs assistance ?Sitting-balance support: Feet supported, Single extremity supported ?Sitting balance-Leahy Scale: Fair ?Sitting balance - Comments: able to keep balance on EOB and with slide board transfer but initially needing minA trunk support to maintain upright posture ?Postural control: Posterior lean ?  ?  ? ?  ?Cognition Arousal/Alertness: Awake/alert ?Behavior During Therapy: [facility] for tasks assessed/performed, Flat affect ?Overall Cognitive Status: Difficult to assess ?  ?  ?  ?  ?  ?  ?  ?  ?  ?  ?  ?  ?  ?  ?  ?  ?General Comments: Utilized [employer] audio interpreter for [place] language [phone]. Pt son present and at time assisting with translation due to delay from audio interpreter and pt with some confusion following ipad interpreter instructions. ?  ?  ? ?  ?   ?   ? ?Pertinent Vitals/Pain Pain Assessment ?Pain Assessment: Faces ?Faces Pain Scale: Hurts a little bit ?Pain Location: groin area ?Pain Descriptors / Indicators: Discomfort ?Pain Intervention(s): Monitored during session, Repositioned  ? ? ? ?PT Goals (current goals can now be found in the care plan section) Acute Rehab PT Goals ?Patient Stated Goal: to get stronger and more independent ?PT Goal Formulation: With patient/family ?Time For Goal Achievement:  01/10/22 ?Progress towards PT goals: Progressing toward goals ? ?  ?Frequency ? ? ? Min 4X/week ? ? ? ?  ?PT Plan Current plan remains appropriate  ? ? ?Co-evaluation PT/OT/SLP Co-Evaluation/Treatment: Yes ?Reason for Co-Treatment: For patient/therapist safety;To address functional/ADL transfers ?PT goals addressed during session: Mobility/safety with mobility;Balance;Proper use of DME;Strengthening/ROM ?OT goals addressed during session: Strengthening/ROM ?  ? ?  ?AM-PAC PT "6 Clicks" Mobility   ?Outcome Measure ? Help needed turning from your back to your side while in a flat bed without using bedrails?: A Lot ?Help needed moving from lying on your back to sitting on the side of a flat bed without using bedrails?: A Lot ?Help needed moving to and from a bed to a chair (including a wheelchair)?: A Lot ?Help needed standing up from a chair using your arms (e.g., wheelchair or bedside chair)?: Total ?Help needed to walk in hospital room?: Total ?Help needed climbing 3-5 steps with a railing? : Total ?6 Click Score: 9 ? ?  ?End of Session   ?Activity Tolerance: Patient tolerated treatment well ?Patient left: in bed;with call bell/phone within reach;with bed alarm set;with family/visitor present;Other (comment) (representative from CIR entering room to speak with him and wife/son.) ?Nurse Communication: Mobility status ?PT Visit Diagnosis: Other abnormalities of gait and mobility (R26.89);Difficulty in walking, not elsewhere classified (R26.2) ?  ? ? ?Time: 0454-0981 ?PT Time Calculation (min) (ACUTE ONLY): 34 min ? ?Charges:  $Wheel Chair Management: 8-22 mins          ?          ? ?  [place] P., PTA ?Acute Rehabilitation Services ?Secure Chat Preferred 9a-5:30pm ?Office: [phone]  ? ? ?Allen Arias ?12/31/2021, 2:01 PM ? ?

## 2021-12-31 NOTE — H&P (Signed)
? ? ?Physical Medicine and Rehabilitation Admission H&P ? ?  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 He was admitted on  04/21/213 for TVAR, R-CFA endarterectomy and LLE thrombectomy by Dr. Donzetta Matters. 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 Acute on chronic Thrombocytopenia treated with FFP, flomax d/c and Levo added to help maintain BP.  ? ?Dr. 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 Dr. Annette Stable was consulted for input on cervical stenosis and question incomplete SCI due to positioning and periop perfusion changes but patient with intact sensory/motor exam BUE and may require decompression once medically stable.   ? ?He had significant drop in H/H to 5.7/17.9 and CTA abdomen/BLE  04/23 was negative for endoleak, showed severe irregular thoracic and abdominal aortic atherosclerosis with large burden of mural thrombus, small hematoma R-CFA, large hematoma L-PA and ill defined muscle body hematoma left adductor and hamstring musculature. He was transfused with 3 units PRBC with improvement in H/H. MRI thoracic spine repeated 04/24 and revealed interval edematous swelling and narrowing of central cord at level of T12 and L1 c/w spinal cord ischemia/infarct. Foley remains in place as failed voiding trial. He has had issues with bowel incontinence. 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 CIR recommended due to functional decline. Son notes poor appetite. ? ? ?Review of Systems  ?Constitutional:  Negative for chills and fever.  ?HENT:  Positive for hearing loss (mild?). Negative for tinnitus.   ?Eyes:  Negative for blurred vision.  ?Respiratory:  Negative for shortness of breath.   ?Cardiovascular:  Negative for chest pain.  ?Gastrointestinal:  Positive for constipation (usually went every 4-5 days/took miralax occasionally).  ?     Appetite has been poor.   ?Genitourinary:   ?     Foley in place  ?Musculoskeletal:  Negative for myalgias.  ?Neurological:  Positive for focal weakness. Negative for dizziness and sensory change.  ?Psychiatric/Behavioral:  The patient is nervous/anxious (due to SCI/worries about his wife) and has insomnia (acute on chronic).   ? ? ?Past Medical History:  ?Diagnosis Date  ? AAA (abdominal aortic aneurysm) ([place])   ? BPH (benign prostatic hyperplasia)   ? Elevated lipids   ? Hypertension   ? Nocturia   ? ? ?Past Surgical History:  ?Procedure Laterality Date  ? ABDOMINAL AORTIC ENDOVASCULAR STENT GRAFT N/A 12/26/2021  ? Procedure: ABDOMINAL AORTIC ENDOVASCULAR STENT GRAFT;  Surgeon: Waynetta Sandy, MD;  Location: [place];  Service: Vascular;  Laterality: N/A;  ? CARPAL TUNNEL RELEASE    ? ENDARTERECTOMY FEMORAL Right 12/26/2021  ? Procedure: RIGHT ENDARTERECTOMY FEMORAL;  Surgeon: Waynetta Sandy, MD;  Location: [place];  Service: Vascular;  Laterality: Right;  ? ENDARTERECTOMY POPLITEAL Left 12/26/2021  ? Procedure: LEFT ENDARTERECTOMY POPLITEAL WITH ANGIOPLASTY USING XENOSURE BIOPATCH (1cmX6cm);  Surgeon: Donzetta Matters,  Georgia Dom, MD;  Location: [place];  Service: Vascular;  Laterality: Left;  ? EYE SURGERY    ? PATCH ANGIOPLASTY Bilateral 12/26/2021  ? Procedure: PATCH ANGIOPLASTY USING XENOSURE BIOLOGIC PATCH (1cmx6cm);  Surgeon: Waynetta Sandy, MD;  Location: [place];  Service: Vascular;  Laterality:  Bilateral;  ? THROMBECTOMY FEMORAL ARTERY Bilateral 12/26/2021  ? Procedure: BILATERAL FEMORAL ARTERY THROMBECTOMY;  Surgeon: Waynetta Sandy, MD;  Location: [facility];  Service: Vascular;  Laterality: Bilateral;  ? ULTRASOUND GUIDANCE FOR VASCULAR ACCESS Bilateral 12/26/2021  ? Procedure: ULTRASOUND GUIDANCE FOR VASCULAR ACCESS;  Surgeon: Waynetta Sandy, MD;  Location: [place];  Service: Vascular;  Laterality: Bilateral;  ? ? ?Family History  ?Problem Relation Age of Onset  ? Gallbladder disease Mother   ?     cholangiocarcinoma  ? Gastric cancer Father   ? ? ?Social History: Married. Has been in [place] for 20 years. 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 He used to smoke cigarettes--1/2 PPD for about 15-20 years. He has never used smokeless tobacco. He does not use any alcohol or drugs.  ? ?Allergies: No Active Allergies ? ? ?Medications Prior to Admission  ?Medication Sig Dispense Refill  ? amLODipine (NORVASC) 5 MG tablet Take 5 mg by mouth daily. (Patient not taking: Reported on 12/18/2021)    ? Ascorbic Acid (VITAMIN C PO) Take 1 capsule by mouth daily.    ? Aspirin Buf,CaCarb-MgCarb-MgO, 81 MG TABS Take 81 mg by mouth as needed for pain.    ? dutasteride (AVODART) 0.5 MG capsule Take 0.5 mg by mouth daily. (Patient not taking: Reported on 12/18/2021)    ? enalapril (VASOTEC) 10 MG tablet Take 20 mg by mouth daily.    ? Melatonin 10 MG CAPS Take 10 mg by mouth at bedtime.    ? Multiple Vitamin (MULTIVITAMIN WITH MINERALS) TABS tablet Take 1 tablet by mouth daily.    ? simvastatin (ZOCOR) 20 MG tablet Take 20 mg by mouth at bedtime.    ? tamsulosin (FLOMAX) 0.4 MG CAPS capsule Take 0.8 mg by mouth daily.    ? traZODone (DESYREL) 50 MG tablet Take 50 mg by mouth at bedtime as needed for sleep.    ? VITAMIN D PO Take 1 capsule by mouth daily.    ? ?Home: ?Home Living ?Family/patient expects to be discharged to:: Private residence ?Living Arrangements: Spouse/significant other ?Available Help at Discharge:  Family, Available PRN/intermittently ?Type of Home: Other(Comment) (townhouse) ?Home Access: Level entry ?Home Layout: Two level ?Alternate Level Stairs-Number of Steps: flight ?Alternate Level Stairs-Rails: Left ?Bathroom Shower/Tub: Tub/shower unit ?Bathroom Toilet: Standard ?Bathroom Accessibility: Yes ?Additional Comments: Living room and kitchen are downstairs, bedroom upstairs ? Lives With: Spouse ?  ?Functional History: ?Prior Function ?Prior Level of Function : Independent/Modified Independent ?Mobility Comments: Enjoys walking ?  ?Functional Status:  ?Mobility: ?Bed Mobility ?Overal bed mobility: Needs Assistance ?Bed Mobility: Supine to Sit ?Supine to sit: Mod assist, +2 for physical assistance ?General bed mobility comments: Assist for BLE's off edge of bed, cues for reaching with RUE for bed rail, assist for trunk elevation to upright, posterior lean Pt vocalizes fear of falling ?Transfers ?Overall transfer level: Needs assistance ?Equipment used: None ?Transfers: Bed to chair/wheelchair/BSC ?Sit to Stand: Mod assist, Max assist, +2 physical assistance, +2 safety/equipment ?Bed to/from chair/wheelchair/BSC transfer type:: Lateral/scoot transfer ?Squat pivot transfers: Max assist, +2 physical assistance ?Transfer via Lift Equipment: Stedy ?General transfer comment: MaxA + 2 to transfer towards right side. Multimodal cues for head/hip  relationship, anterior weight shift, use of arms ?  ?ADL: ?ADL ?Overall ADL's : Needs assistance/impaired ?Eating/Feeding: Set up, Sitting ?Grooming: Set up, Wash/dry hands, Wash/dry face, Sitting ?Grooming Details (indicate cue type and reason): supported seat in the recliner ?Upper Body Bathing: Moderate assistance, Sitting ?Upper Body Bathing Details (indicate cue type and reason): for back sitting EOB ?Lower Body Bathing: Maximal assistance ?Upper Body Dressing : Moderate assistance ?Lower Body Dressing: Maximal assistance ?Lower Body Dressing Details (indicate cue type  and reason): able to lift L leg to assist with donning of socks ?Toilet Transfer: Maximal assistance, +2 for physical assistance, +2 for safety/equipment (stedy) ?Toilet Transfer Details (indicate cue type and

## 2022-01-01 ENCOUNTER — Inpatient Hospital Stay (HOSPITAL_COMMUNITY): Payer: Medicare Other

## 2022-01-01 DIAGNOSIS — G9511 Acute infarction of spinal cord (embolic) (nonembolic): Secondary | ICD-10-CM | POA: Diagnosis not present

## 2022-01-01 DIAGNOSIS — R609 Edema, unspecified: Secondary | ICD-10-CM

## 2022-01-01 LAB — COMPREHENSIVE METABOLIC PANEL
ALT: 49 U/L — ABNORMAL HIGH (ref 0–44)
AST: 90 U/L — ABNORMAL HIGH (ref 15–41)
Albumin: 2.5 g/dL — ABNORMAL LOW (ref 3.5–5.0)
Alkaline Phosphatase: 28 U/L — ABNORMAL LOW (ref 38–126)
Anion gap: 7 (ref 5–15)
BUN: 38 mg/dL — ABNORMAL HIGH (ref 8–23)
CO2: 24 mmol/L (ref 22–32)
Calcium: 8 mg/dL — ABNORMAL LOW (ref 8.9–10.3)
Chloride: 106 mmol/L (ref 98–111)
Creatinine, Ser: 0.84 mg/dL (ref 0.61–1.24)
GFR, Estimated: >60 mL/min (ref >60)
Glucose, Bld: 87 mg/dL (ref 70–99)
Potassium: 3.7 mmol/L (ref 3.5–5.1)
Sodium: 137 mmol/L (ref 135–145)
Total Bilirubin: 2.3 mg/dL — ABNORMAL HIGH (ref 0.3–1.2)
Total Protein: 4.4 g/dL — ABNORMAL LOW (ref 6.5–8.1)

## 2022-01-01 LAB — CBC WITH DIFFERENTIAL/PLATELET
Abs Immature Granulocytes: 0 10*3/uL (ref 0.00–0.07)
Basophils Absolute: 0 10*3/uL (ref 0.0–0.1)
Basophils Relative: 0 %
Eosinophils Absolute: 0.2 10*3/uL (ref 0.0–0.5)
Eosinophils Relative: 4 %
HCT: 26.6 % — ABNORMAL LOW (ref 39.0–52.0)
Hemoglobin: 9.1 g/dL — ABNORMAL LOW (ref 13.0–17.0)
Lymphocytes Relative: 11 %
Lymphs Abs: 0.6 10*3/uL — ABNORMAL LOW (ref 0.7–4.0)
MCH: 31.9 pg (ref 26.0–34.0)
MCHC: 34.2 g/dL (ref 30.0–36.0)
MCV: 93.3 fL (ref 80.0–100.0)
Monocytes Absolute: 0.2 10*3/uL (ref 0.1–1.0)
Monocytes Relative: 4 %
Neutro Abs: 4.6 10*3/uL (ref 1.7–7.7)
Neutrophils Relative %: 81 %
Platelets: 73 10*3/uL — ABNORMAL LOW (ref 150–400)
RBC: 2.85 MIL/uL — ABNORMAL LOW (ref 4.22–5.81)
RDW: 17.4 % — ABNORMAL HIGH (ref 11.5–15.5)
WBC: 5.7 10*3/uL (ref 4.0–10.5)
nRBC: 0 % (ref 0.0–0.2)
nRBC: 0 /100 WBC

## 2022-01-01 IMAGING — DX DG ABD PORTABLE 1V
1 series · 1 of 1 positions shown · non-contrast
Comparison: [DATE], CTA [DATE]

CLINICAL DATA: Neurogenic bowel, constipation

EXAM:
PORTABLE ABDOMEN - 1 VIEW

[abdomen]
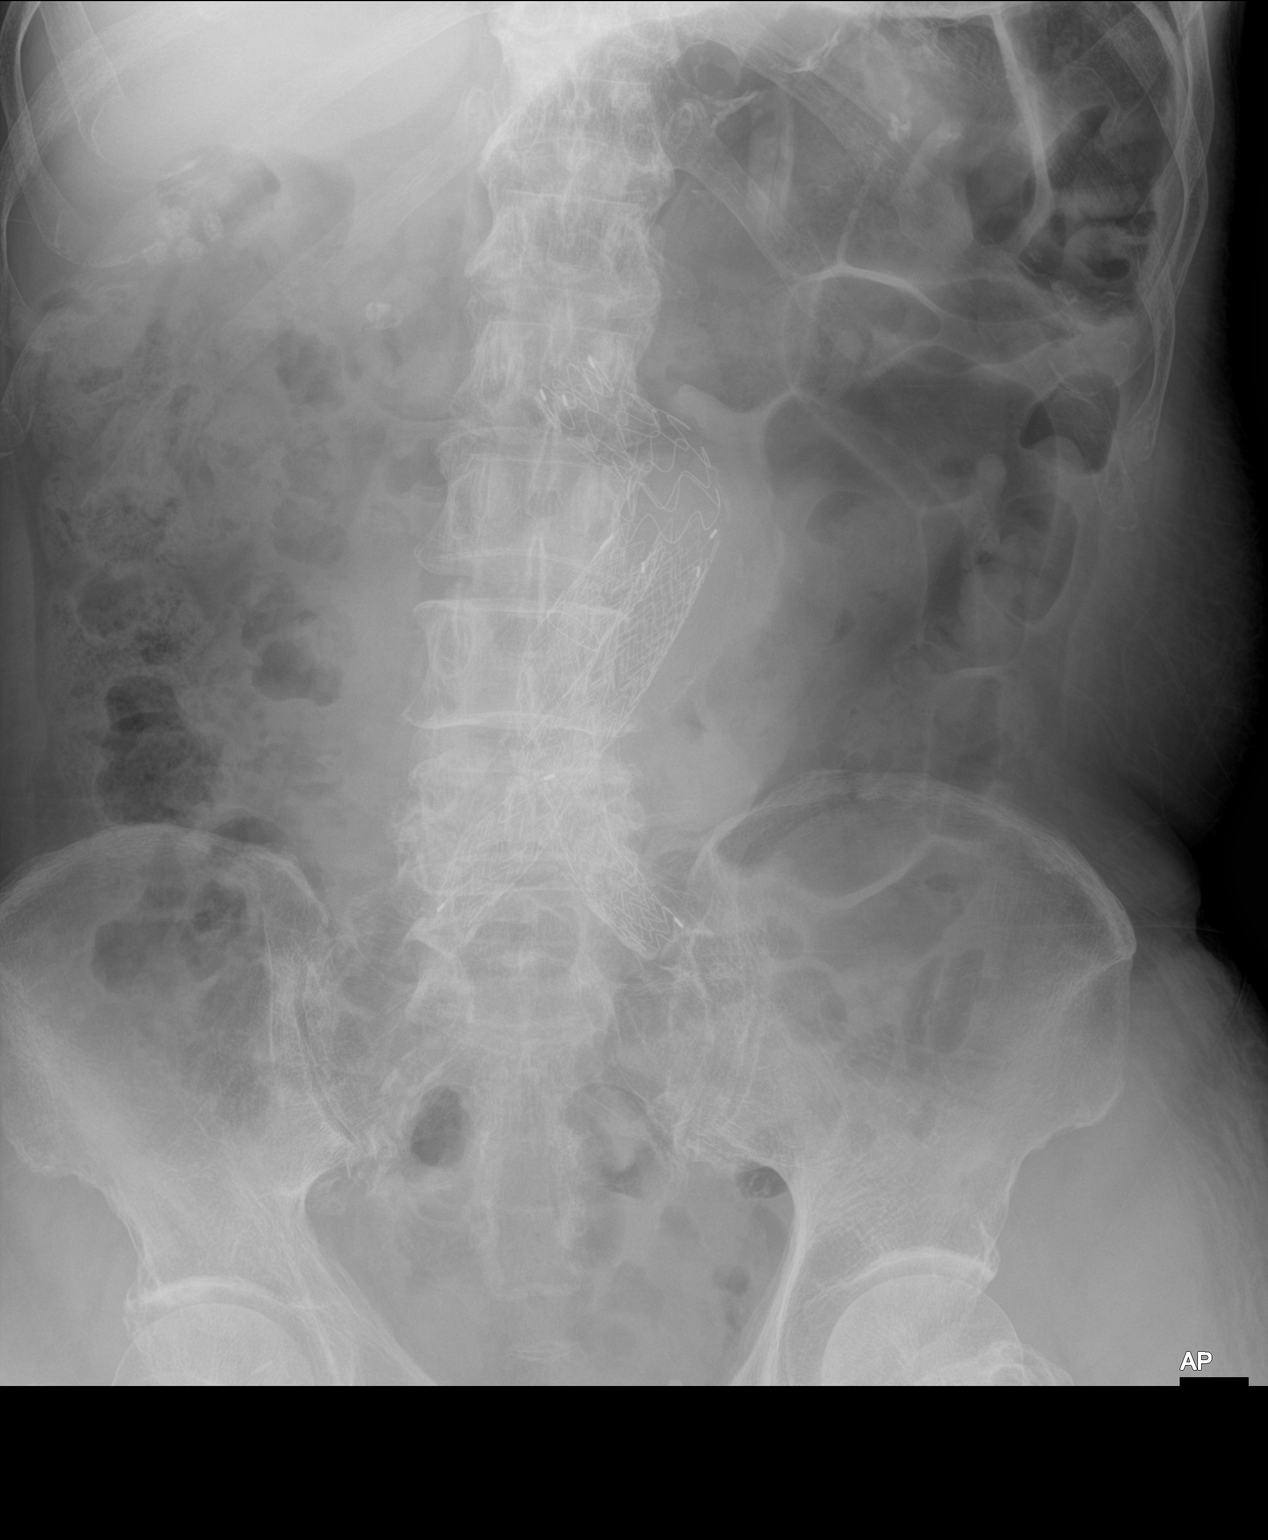

[1 of 1 positions shown; findings below may reference images not displayed]

FINDINGS: Nonobstructive bowel gas pattern. Small to moderate stool within the
proximal colon. The distal colon appears evacuated. Bifurcated
endovascular stent graft repair of the previously noted abdominal
aortic aneurysm is again noted. Cholelithiasis noted. A 8 mm
calcification is seen within the right paraspinal region
corresponding to the a gallbladder neck on prior CT arteriogram and
stable since prior examination. No acute bone abnormality.
IMPRESSION: Normal abdominal gas pattern.  Small to moderate stool burden.

Cholelithiasis. Fixed calculus in the expected location of the
gallbladder neck when compared to prior examination. Correlation
with liver enzymes would be helpful in excluding an obstructive or
inflammatory process.

## 2022-01-01 MED ORDER — ASCORBIC ACID 500 MG PO TABS
500.0000 mg | ORAL_TABLET | Freq: Every day | ORAL | Status: DC
  Administered 2022-01-01 – 2022-01-02 (×2): 500 mg via ORAL
  Filled 2022-01-01 (×2): qty 1

## 2022-01-01 MED ORDER — BISACODYL 10 MG RE SUPP
10.0000 mg | Freq: Every day | RECTAL | Status: DC
  Administered 2022-01-01 – 2022-01-05 (×4): 10 mg via RECTAL
  Filled 2022-01-01 (×6): qty 1

## 2022-01-01 MED ORDER — CITALOPRAM HYDROBROMIDE 20 MG PO TABS
20.0000 mg | ORAL_TABLET | Freq: Every day | ORAL | Status: DC
  Administered 2022-01-02 – 2022-01-26 (×25): 20 mg via ORAL
  Filled 2022-01-01 (×25): qty 1

## 2022-01-01 MED ORDER — TAMSULOSIN HCL 0.4 MG PO CAPS
0.4000 mg | ORAL_CAPSULE | Freq: Every day | ORAL | Status: DC
  Administered 2022-01-01: 0.4 mg via ORAL
  Filled 2022-01-01: qty 1

## 2022-01-01 MED ORDER — VITAMIN D 25 MCG (1000 UNIT) PO TABS
1000.0000 [IU] | ORAL_TABLET | Freq: Every day | ORAL | Status: DC
  Administered 2022-01-01 – 2022-01-26 (×26): 1000 [IU] via ORAL
  Filled 2022-01-01 (×26): qty 1

## 2022-01-01 MED ORDER — SODIUM CHLORIDE 0.9 % IV SOLN: INTRAVENOUS | Status: AC

## 2022-01-01 MED ORDER — ADULT MULTIVITAMIN W/MINERALS CH
1.0000 | ORAL_TABLET | Freq: Every day | ORAL | Status: DC
  Administered 2022-01-01 – 2022-01-02 (×2): 1 via ORAL
  Filled 2022-01-01 (×2): qty 1

## 2022-01-01 NOTE — Progress Notes (Signed)
Physical Therapy Assessment and Plan ? ?Patient Details  ?Name: Allen Arias ?MRN: [id number] ?Date of Birth: Jul 22, 1941 ? ?PT Diagnosis: Abnormal posture, Muscle weakness, Paraplegia, and Pain in BLEs ?Rehab Potential: Good (Simultaneous filing. User may not have seen previous data.) ?ELOS: 4 weeks  ? ?Today's Date: 01/01/2022 ?PT Individual Time: [id number] ?PT Individual Time Calculation (min): 60 min   ? ?Hospital Problem: Principal Problem: ?  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 ? ?Past Medical History:  ?Past Medical History:  ?Diagnosis Date  ? AAA (abdominal aortic aneurysm) ([place])   ? BPH (benign prostatic hyperplasia)   ? Elevated lipids   ? Hypertension   ? Nocturia   ? ?Past Surgical History:  ?Past Surgical History:  ?Procedure Laterality Date  ? ABDOMINAL AORTIC ENDOVASCULAR STENT GRAFT N/A 12/26/2021  ? Procedure: ABDOMINAL AORTIC ENDOVASCULAR STENT GRAFT;  Surgeon: Waynetta Sandy, MD;  Location: [facility];  Service: Vascular;  Laterality: N/A;  ? CARPAL TUNNEL RELEASE    ? ENDARTERECTOMY FEMORAL Right 12/26/2021  ? Procedure: RIGHT ENDARTERECTOMY FEMORAL;  Surgeon: Waynetta Sandy, MD;  Location: [facility];  Service: Vascular;  Laterality: Right;  ? ENDARTERECTOMY POPLITEAL Left 12/26/2021  ? Procedure: LEFT ENDARTERECTOMY POPLITEAL WITH ANGIOPLASTY USING XENOSURE BIOPATCH (1cmX6cm);  Surgeon: Waynetta Sandy, MD;  Location: [place] [facility];  Service: Vascular;  Laterality: Left;  ? EYE SURGERY    ? PATCH ANGIOPLASTY Bilateral 12/26/2021  ? Procedure: PATCH ANGIOPLASTY USING XENOSURE BIOLOGIC PATCH (1cmx6cm);  Surgeon: Waynetta Sandy, MD;  Location: [facility];  Service: Vascular;  Laterality: Bilateral;  ? THROMBECTOMY FEMORAL ARTERY Bilateral 12/26/2021  ? Procedure: BILATERAL FEMORAL ARTERY THROMBECTOMY;  Surgeon: Waynetta Sandy, MD;  Location: [facility];  Service: Vascular;  Laterality: Bilateral;  ? ULTRASOUND GUIDANCE FOR VASCULAR ACCESS Bilateral 12/26/2021  ?  Procedure: ULTRASOUND GUIDANCE FOR VASCULAR ACCESS;  Surgeon: Waynetta Sandy, MD;  Location: [place];  Service: Vascular;  Laterality: Bilateral;  ? ? ?Assessment & Plan ?Clinical Impression: Allen Arias is a 81 year old [place] male with h/o BPH, nocturia, Hyperlipidemia, who was found to have 5 cm AAA with scattered mural thrombus in 11/22 with follow up CTA showing increase in size. He was admitted on  04/21/213 for TVAR, R-CFA endarterectomy and LLE thrombectomy by Dr. Donzetta Matters. 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 Acute on chronic Thrombocytopenia treated with FFP, flomax d/c and Levo added to help maintain BP.  ?  ?Dr. 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 Dr. Annette Stable was consulted for input on cervical stenosis and question incomplete SCI due to positioning and periop perfusion changes but patient with intact sensory/motor exam BUE and may require decompression once medically stable.   ?  ?He had significant drop in H/H to 5.7/17.9 and CTA abdomen/BLE  04/23 was negative for endoleak, showed severe irregular thoracic and abdominal aortic atherosclerosis with large burden of mural thrombus, small hematoma R-CFA, large hematoma L-PA and ill defined muscle body hematoma left adductor and hamstring musculature. He was transfused with 3 units PRBC with improvement in H/H. MRI thoracic spine repeated 04/24 and revealed interval edematous swelling and narrowing  of central cord at level of T12 and L1 c/w spinal cord ischemia/infarct. Foley remains in place as failed voiding trial. He has had issues with bowel incontinence. He  continues to be limited by BLE paraplegia with posterior lean at EOB due to fear of falling and working on SB use for transfers. CIR recommended due to functional decline. Son notes poor appetite. Patient transferred to CIR on 12/31/2021 .  ? ?Patient currently requires max with mobility secondary to muscle weakness and muscle paralysis.  Prior to hospitalization, patient was independent  with mobility and lived with Spouse in a Other(Comment) (Townhouse) home.  Home access is  Level entry. ? ?Patient will benefit from skilled PT intervention to maximize safe functional mobility, minimize fall risk, and decrease caregiver burden for planned discharge home with 24 hour assist.  Anticipate patient will benefit from follow up [facility] at discharge. ? ?PT - End of Session ?Activity Tolerance: Tolerates 30+ min activity with multiple rests ?Endurance Deficit: Yes ?PT Assessment ?Rehab Potential (ACUTE/IP ONLY): Good (Simultaneous filing. User may not have seen previous data.) ?PT Barriers to Discharge: Inaccessible home environment;Decreased caregiver support;Neurogenic Bowel & Bladder;Home environment access/layout;Incontinence ?PT Patient demonstrates impairments in the following area(s): Balance;Endurance;Motor;Pain;Safety ?PT Transfers Functional Problem(s): Bed Mobility;Bed to Chair;Car;Furniture;Floor ?PT Locomotion Functional Problem(s): Wheelchair Mobility;Ambulation;Stairs ?PT Plan ?PT Intensity: Minimum of 1-2 x/day ,45 to 90 minutes ?PT Frequency: 5 out of 7 days ?PT Duration Estimated Length of Stay: 4 weeks ?PT Treatment/Interventions: Ambulation/gait training;Balance/vestibular training;Discharge planning;DME/adaptive equipment instruction;Functional mobility training;Neuromuscular re-education;Pain management;Patient/family education;Therapeutic Activities;Therapeutic Exercise;UE/LE Strength taining/ROM;Wheelchair propulsion/positioning;Community reintegration;Disease management/prevention;Functional electrical  stimulation;Psychosocial support;Splinting/orthotics;Stair training;UE/LE Coordination activities ?PT Transfers Anticipated Outcome(s): min A ?PT Locomotion Anticipated Outcome(s): min A at w/c level ?PT Recommendation ?Recommendations for Other Services: Neuropsych consult ?Follow Up Recommendations: Home health PT ?Patient destination: Home ?Equipment Recommended: Sliding board;Wheelchair (measurements);Wheelchair cushion (measurements) ?Equipment Details: TBD pending progress ? ? ?PT Evaluation ?Precautions/Restrictions ?Precautions ?Precautions: Fall ?Precaution Comments: foley ?Restrictions ?Weight Bearing Restrictions: No ?General ?PT Amount of Missed Time (min): 9 Minutes Vital SignsTherapy Vitals ?Temp: 98.4 ?F (36.9 ?C) ?Temp Source: Oral ?Pulse Rate: 81 ?Resp: 15 ?BP: 132/69 ?Patient Position (if appropriate): Lying ?Oxygen Therapy ?SpO2: 100 % ?O2 Device: Room Air ?Pain ?  ?Pain Interference ?Pain Interference ?Pain Effect on Sleep: 2. Occasionally ?Pain Interference with Therapy Activities: 2. Occasionally ?Pain Interference with Day-to-Day Activities: 2. Occasionally ?Home Living/Prior Functioning ?Home Living ?Available Help at Discharge: Family;Available PRN/intermittently ?Type of Home: Other(Comment) (Townhouse) ?Home Access: Level entry ?Home Layout: Two level ?Alternate Level Stairs-Number of Steps: flight ?Alternate Level Stairs-Rails: Left ?Bathroom Shower/Tub: Tub/shower unit ? Lives With: Spouse ?Prior Function ?Level of Independence: Independent with gait;Independent with homemaking with ambulation;Independent with basic ADLs ? Able to Take Stairs?: Yes ?Driving: Yes ?Vision/Perception  ?Vision - History ?Ability to See in Adequate Light: 0 Adequate ?Patient Visual Report: Other (comment) (Pt reports decline in vision with aging but no signficant change from baseline) ?Perception ?Perception: Within Functional Limits ?Praxis ?Praxis: Intact  ?Cognition ?Overall Cognitive Status: Within  Functional Limits for tasks assessed ?Arousal/Alertness: Awake/alert ?Orientation Level: Oriented X4 ?Attention: Focused;Sustained;Selective ?Focused Attention: Appears intact ?Selective Attention: Appears intact ?[place]

## 2022-01-01 NOTE — Progress Notes (Signed)
Interpreter ipad used to communicate with pt this morning. Pt denies new additional pain. Pt requested to wait before eating breakfast until the daughter got here. All other pt needs met. Pt resting in bed at this time.  ?

## 2022-01-01 NOTE — Progress Notes (Signed)
Arrived to patient room and patient is currently up in [facility] and SW at bedside. Notified nurse and requested to have consult placed when patient is back in bed. Nurse VU. Tomasita Morrow, RN VAST ?

## 2022-01-01 NOTE — Progress Notes (Signed)
Inpatient Rehabilitation  Patient information reviewed and entered into eRehab system by Ellisyn Icenhower M. Jude Naclerio, M.A., CCC/SLP, PPS Coordinator.  Information including medical coding, functional ability and quality indicators will be reviewed and updated through discharge.    

## 2022-01-01 NOTE — Progress Notes (Signed)
Occupational Therapy Assessment and Plan ? ?Patient Details  ?Name: Allen Arias ?MRN: [id number] ?Date of Birth: [birth year] ? ?OT Diagnosis: abnormal posture and paraplegia at level T12 incomplete; generalized weakness ?Rehab Potential: Rehab Potential (ACUTE ONLY): Good ?ELOS: 3-4 weeks  ? ?   ?Today's Date: 01/01/2022 ?OT Individual Time: 0800-0900 ?OT Individual Time Calculation (min): 60 min  ?Hospital Problem: Principal Problem: ?  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 ? ?Past Medical History:  ?Past Medical History:  ?Diagnosis Date  ? AAA (abdominal aortic aneurysm) (HCC)   ? BPH (benign prostatic hyperplasia)   ? Elevated lipids   ? Hypertension   ? Nocturia   ? ?Past Surgical History:  ?Past Surgical History:  ?Procedure Laterality Date  ? ABDOMINAL AORTIC ENDOVASCULAR STENT GRAFT N/A 12/26/2021  ? Procedure: ABDOMINAL AORTIC ENDOVASCULAR STENT GRAFT;  Surgeon: Maeola Harman, MD;  Location: [facility] OR;  Service: Vascular;  Laterality: N/A;  ? CARPAL TUNNEL RELEASE    ? ENDARTERECTOMY FEMORAL Right 12/26/2021  ? Procedure: RIGHT ENDARTERECTOMY FEMORAL;  Surgeon: Maeola Harman, MD;  Location: [facility] OR;  Service: Vascular;  Laterality: Right;  ? ENDARTERECTOMY POPLITEAL Left 12/26/2021  ? Procedure: LEFT ENDARTERECTOMY POPLITEAL WITH ANGIOPLASTY USING XENOSURE BIOPATCH (1cmX6cm);  Surgeon: Maeola Harman, MD;  Location: [facility] OR;  Service: Vascular;  Laterality: Left;  ? EYE SURGERY    ? PATCH ANGIOPLASTY Bilateral 12/26/2021  ? Procedure: PATCH ANGIOPLASTY USING XENOSURE BIOLOGIC PATCH (1cmx6cm);  Surgeon: Maeola Harman, MD;  Location: [facility] OR;  Service: Vascular;  Laterality: Bilateral;  ? THROMBECTOMY FEMORAL ARTERY Bilateral 12/26/2021  ? Procedure: BILATERAL FEMORAL ARTERY THROMBECTOMY;  Surgeon: Maeola Harman, MD;  Location: [facility] OR;  Service: Vascular;  Laterality: Bilateral;  ? ULTRASOUND GUIDANCE FOR VASCULAR ACCESS Bilateral 12/26/2021  ? Procedure:  ULTRASOUND GUIDANCE FOR VASCULAR ACCESS;  Surgeon: Maeola Harman, MD;  Location: [facility] OR;  Service: Vascular;  Laterality: Bilateral;  ? ? ?Assessment & Plan ?Clinical Impression: Allen Arias is a 81 year old [place] male with h/o BPH, nocturia, Hyperlipidemia, who was found to have 5 cm AAA with scattered mural thrombus in 11/22 with follow up CTA showing increase in size. He was admitted on  04/21/213 for TVAR, R-CFA endarterectomy and LLE thrombectomy by Dr. Randie Heinz. 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 Acute on chronic Thrombocytopenia treated with FFP, flomax d/c and Levo added to help maintain BP.  ?  ?Dr. 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 Dr. Jordan Likes was consulted for input on cervical stenosis and question incomplete SCI due to positioning and periop perfusion changes but patient with intact sensory/motor exam BUE and may require decompression once medically stable.   ?  ?He had significant drop in H/H to 5.7/17.9 and CTA abdomen/BLE  04/23 was negative for endoleak, showed severe irregular thoracic and abdominal aortic atherosclerosis with large burden of mural thrombus, small hematoma R-CFA, large hematoma L-PA and ill defined muscle body hematoma left adductor and hamstring musculature. He was transfused with 3 units PRBC with improvement in H/H. MRI thoracic spine repeated 04/24 and revealed interval edematous swelling and narrowing of central cord  at level of T12 and L1 c/w spinal cord ischemia/infarct. Foley remains in place as failed voiding trial. He has had issues with bowel incontinence. He continues to  be limited by BLE paraplegia with posterior lean at EOB due to fear of falling and working on SB use for transfers. CIR recommended due to functional decline. Son notes poor appetite ? ?Patient currently requires max with basic self-care skills secondary to muscle weakness and muscle paralysis, decreased cardiorespiratoy endurance, impaired timing and sequencing, abnormal tone, unbalanced muscle activation, decreased coordination, and decreased motor planning, and decreased sitting balance, decreased standing balance, decreased postural control, and decreased balance strategies.  Prior to hospitalization, patient could complete BADL/IADL with modified independent . ? ?Patient will benefit from skilled intervention to decrease level of assist with basic self-care skills and increase independence with basic self-care skills prior to discharge home with care partner.  Anticipate patient will require 24 hour supervision and minimal physical assistance and follow up home health. ? ?OT - End of Session ?Activity Tolerance: Tolerates 30+ min activity with multiple rests ?Endurance Deficit: Yes ?OT Assessment ?Rehab Potential (ACUTE ONLY): Good ?OT Barriers to Discharge: Inaccessible home environment;Home environment access/layout;Incontinence;Neurogenic Bowel & Bladder ?OT Patient demonstrates impairments in the following area(s): Balance;Cognition;Endurance;Motor;Perception;Safety;Sensory ?OT Basic ADL's Functional Problem(s): Grooming;Bathing;Dressing;Toileting ?OT Transfers Functional Problem(s): Toilet;Tub/Shower ?OT Plan ?OT Intensity: Minimum of 1-2 x/day, 45 to 90 minutes ?OT Frequency: 5 out of 7 days ?OT Duration/Estimated Length of Stay: 3-4 weeks ?OT Treatment/Interventions: Balance/vestibular training;Discharge planning;Functional electrical stimulation;Pain management;Self Care/advanced ADL retraining;Therapeutic Activities;UE/LE Coordination activities;Visual/perceptual remediation/compensation;Therapeutic  Exercise;Skin care/wound managment;Functional mobility training;Patient/family education;Disease mangement/prevention;Cognitive remediation/compensation;Community reintegration;Neuromuscular re-education;DME/adaptive equipment instruction;Psychosocial support;Splinting/orthotics;UE/LE Strength taining/ROM;Wheelchair propulsion/positioning ?OT Self Feeding Anticipated Outcome(s): S ?OT Basic Self-Care Anticipated Outcome(s): MIN ?OT Toileting Anticipated Outcome(s): MIN ?OT Bathroom Transfers Anticipated Outcome(s): MIN ?OT Recommendation ?Follow Up Recommendations: Home health OT ?Equipment Recommended: 3 in 1 bedside comode;Tub/shower bench;To be determined ?Equipment Details: need to clarify bathroom set up in level of home to determine shower equipment ? ? ?OT Evaluation ?Precautions/Restrictions  ?Precautions ?Precautions: Fall ?Precaution Comments: foley ?Restrictions ?Weight Bearing Restrictions: No ?General ?Chart Reviewed: Yes ?Family/Caregiver Present: No ?Vital Signs ?  ?Pain ?  ?Home Living/Prior Functioning ?Home Living ?Family/patient expects to be discharged to:: Private residence ?Living Arrangements: Spouse/significant other ?Available Help at Discharge: Family, Available PRN/intermittently ?Type of Home: Other(Comment) ?Home Access: Level entry ?Home Layout: Two level ?Alternate Level Stairs-Number of Steps: flight ?Alternate Level Stairs-Rails: Left ?Bathroom Shower/Tub: Tub/shower unit ?Bathroom Toilet: Standard ?Bathroom Accessibility: Yes ?Additional Comments: Living room and kitchen are downstairs, bedroom upstairs ? Lives With: Spouse ?Prior Function ?Level of Independence: Independent with basic ADLs, Independent with homemaking with ambulation ?Vision ?Ability to See in Adequate Light: 0 Adequate ?Patient Visual Report: No change from baseline ?Vision Assessment?: No apparent visual deficits ?Perception  ?Perception: Within Functional Limits ?Praxis ?Praxis:  Intact ?Cognition ?Cognition ?Overall Cognitive Status: Within Functional Limits for tasks assessed ?Orientation Level: Person;Place;Situation ?Person: Oriented ?Place: Oriented ?Situation: Oriented ?Brief Interview for Mental Status (BIMS) ?R

## 2022-01-01 NOTE — Progress Notes (Signed)
?                                                       PROGRESS NOTE ? ? ?Subjective/Complaints: ? ?Pt reports no pain except RLE bothering him overnight. Muscle tightness ?LBM incontinent last evening- ?Has foley-  ?He took care of wife, who has dementia, prior to surgery . ? ? ? ?ROS: ?Limited by language ? ?Objective: ?  ?No results found. ?Recent Labs  ?  01/01/22 ?6948  ?WBC 5.7  ?HGB 9.1*  ?HCT 26.6*  ?PLT 73*  ? ?Recent Labs  ?  01/01/22 ?5462  ?NA 137  ?K 3.7  ?CL 106  ?CO2 24  ?GLUCOSE 87  ?BUN 38*  ?CREATININE 0.84  ?CALCIUM 8.0*  ? ? ?Intake/Output Summary (Last 24 hours) at 01/01/2022 0904 ?Last data filed at 01/01/2022 0500 ?Gross per 24 hour  ?Intake 120 ml  ?Output 300 ml  ?Net -180 ml  ?  ? ?  ? ?Physical Exam: ?Vital Signs ?Blood pressure 131/66, pulse 81, temperature 98.4 ?F (36.9 ?C), resp. rate 14, height 5\' 4"  (1.626 m), weight 50 kg, SpO2 97 %. ? ? ?General: awake, alert, appropriate, NAD ?HENT: conjugate gaze; oropharynx moist ?CV: regular rate; no JVD ?Pulmonary: CTA B/L; no W/R/R- good air movement ?GI: soft, NT, ND, (+)BS ?Psychiatric: appropriate ?Neurological: Ox3 ?MS: 1/5 in RLE; and 2-/5 in LLE ? ?Assessment/Plan: ?1. Functional deficits which require 3+ hours per day of interdisciplinary therapy in a comprehensive inpatient rehab setting. ?Physiatrist is providing close team supervision and 24 hour management of active medical problems listed below. ?Physiatrist and rehab team continue to assess barriers to discharge/monitor patient progress toward functional and medical goals ? ?Care Tool: ? ?Bathing ?   ?   ?   ?  ?  ?Bathing assist Assist Level: Moderate Assistance - Patient 50 - 74% ?  ?  ?Upper Body Dressing/Undressing ?Upper body dressing   ?What is the patient wearing?: Hospital gown only ?   ?Upper body assist Assist Level: Moderate Assistance - Patient 50 - 74% ?   ?Lower Body Dressing/Undressing ?Lower body dressing ? ? ?   ?  ? ?  ? ?Lower body assist   ?    ? ?Toileting ?Toileting    ?Toileting assist   ?  ?  ?Transfers ?Chair/bed transfer ? ?Transfers assist ?   ? ?  ?  ?  ?Locomotion ?Ambulation ? ? ?Ambulation assist ? ?   ? ?  ?  ?   ? ?Walk 10 feet activity ? ? ?Assist ?   ? ?  ?   ? ?Walk 50 feet activity ? ? ?Assist   ? ?  ?   ? ? ?Walk 150 feet activity ? ? ?Assist   ? ?  ?  ?  ? ?Walk 10 feet on uneven surface  ?activity ? ? ?Assist   ? ? ?  ?   ? ?Wheelchair ? ? ? ? ?Assist   ?Type of Wheelchair: Manual ?  ? ?  ?   ? ? ?Wheelchair 50 feet with 2 turns activity ? ? ? ?Assist ? ?  ?  ? ? ?   ? ?Wheelchair 150 feet activity  ? ? ? ?Assist ?   ? ? ?   ? ?Blood pressure 131/66, pulse  81, temperature 98.4 ?F (36.9 ?C), resp. rate 14, height 5\' 4"  (1.626 m), weight 50 kg, SpO2 97 %. ? ?Medical Problem List and Plan: ?1. 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  ?            -patient may shower but incisions must be covered ?            -ELOS/Goals: MinA 10-14 days ?            Admit to CIR ?2.  Antithrombotics: ?-DVT/anticoagulation:  Mechanical: Sequential compression devices, below knee Bilateral lower extremities ?--check dopplers in am.  ? 4/27- cannot be on Lovenox/Eliquis due to low plts- risk of bleeding. If gets ?100k, will start- Doppler spending.  ?            -antiplatelet therapy: N/A ?3. Postoperative pain:  Start tylenol 650mg  TID. Oxycodone prn.  ?4. Mood: Team to provide ego support. May need antidepressant-->patient and family very disappointed and multiple questions/anxiety re: recovery, interventions, 2nd opinion etc. Discussed Dr. 5/27 specialty on Spinal cord pathology.                 4/27- will start Celexa 20 mg daily for mood.  ?-antipsychotic agents: N/A ?5. Neuropsych: This patient may be capable of making decisions on his own behalf. ?6. Skin/Wound Care: Routine pressure relief measures.  ?            --Gerhadt's cream to scrotal breakdown ?  ?7. Fluids/Electrolytes/Nutrition: Monitor I/O. Check CMET in am. ?8.  H/o BPH/urinary retention: Continue to hold Hytrin and Flomax for now.  ?            --monitor BP for any orthostatic changes/drops.  ?9. Thrombocytopenia: Chronic? Monitor for signs of bleeding ?--recheck in am. Has varied from 100-->44-->56 ? 4/28- Plts 73k today- doing better- will check Saturday and qMonday and will decide what to do Monday.  ?10. ABLA: Monitor H/H for stability.  ?--Improved from 5.7 -->10. Recheck CBC in am.  ? 4/27- Hb 9.1-  ?11. Insomnia: Slept well last nigh with trazodone. Continue prn ?12. 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  ? 4/27- started bowel program q night- with dig stim and suppository. Educated pt and son on bowel program.  ? 47. Neurogenic bladder- has foley due to urinary retention/low BP ? 4/27- will remove foley in AM- and start Flomax 0.4 mg q night since was having low BP up until now-will monitor and stop if BP goes low again.   ?14. Azotemia ? 4/27- BUN 38- was 21- will give IVFs- 50cc/hour- and recheck in AM- if not better tomorrow, will not remove foley.  ? ? ?I spent a total of 55   minutes on total care today- >50% coordination of care- due to prolonged education to pt/son via translator/interpretor; also d/w PA about all medical issues- high medical complexity.  ? ?LOS: ?1 days ?A FACE TO FACE EVALUATION WAS PERFORMED ? ?Allen Arias ?01/01/2022, 9:04 AM  ? ? ? ?

## 2022-01-01 NOTE — Progress Notes (Signed)
Physical Therapy Session Note ? ?Patient Details  ?Name: Allen Arias ?MRN: [id number] ?Date of Birth: [birth year] ? ?Today's Date: 01/01/2022 ?PT Individual Time: 4834-7583 ?PT Individual Time Calculation (min): 51 min  ? ?Short Term Goals: ?Week 1:   PT Short Term Goal 1 (Week 1): Will be able to perform bed to chair transfer with slideboard minA ?PT Short Term Goal 2 (Week 1): Will be able to perform supine to sit with modA ?PT Short Term Goal 3 (Week 1): Will be able to maintain dynamic sitting balance during reaching activity with feet supported minA ?PT Short Term Goal 4 (Week 1): Pt will initiate w/c mobility ? ?Skilled Therapeutic Interventions/Progress Updates: Pt presented in bed agreeable to therapy with family and interpreter present. Pt states some pain but did not rate and no pain behaviors noted during session. Per family request pt and family provided quick tour of unit but ended in ortho gym with family returning back to room. Pt performed Slide board transfer to R with total A for Slide board set up and modA for transfer. Pt required multimodal cues for improved head hips relationship. At [facility] pt participated in various balance activities including maintaining static balance without UE support, reaching to contralateral side of mat, shoulder flexion to 90, all performed 5-8 reps bilaterally. Pt required  intermittent supported rest breaks due to fatigue. Pt also participated in small range modified sit ups with PTA providing minA to initiate. Performed forward reaching to tap targets on chair and returning to midline. Pt required minA for activity and decreased control when reaching forward and pt "pushing off" with UE to return to midline. Pt was able to stop propulsion prior to going into posterior lean. Pt then able to perform lateral scoot towards TIS and performed Slide board transfer to L to return to TIS with minA demonstrating improved carryover of head/hips relationship. Pt transported back to  room and remained in TIS per family request. Nsg present to prepare pt for IV, with chair tilted upright pt able to sit up and don scrub shirt with minA. Pt left at end of session with nsg and family present and current needs met.  ?   ? ?Therapy Documentation ?Precautions:  ?Precautions ?Precautions: Fall ?Precaution Comments: foley ?Restrictions ?Weight Bearing Restrictions: No ?General: ?PT Amount of Missed Time (min): 9 Minutes ?Vital Signs: ?Therapy Vitals ?Temp: 98.4 ?F (36.9 ?C) ?Temp Source: Oral ?Pulse Rate: 81 ?Resp: 15 ?BP: 132/69 ?Patient Position (if appropriate): Lying ?Oxygen Therapy ?SpO2: 100 % ?O2 Device: Room Air ?Pain: ?  ?Mobility: ?Bed Mobility ?Bed Mobility: Rolling Right;Rolling Left;Supine to Sit;Sit to Supine ?Rolling Right: Moderate Assistance - Patient 50-74% ?Rolling Left: Moderate Assistance - Patient 50-74% ?Supine to Sit: Maximal Assistance - Patient - Patient 25-49% ?Sit to Supine: Maximal Assistance - Patient 25-49% ?Locomotion : ?   ?Trunk/Postural Assessment : ?Cervical Assessment ?Cervical Assessment: Exceptions to [facility] (head forward) ?Thoracic Assessment ?Thoracic Assessment: Exceptions to [facility] (rounded shoulders) ?Lumbar Assessment ?Lumbar Assessment: Exceptions to [facility] (post pelvic tilt) ?Postural Control ?Postural Control: Deficits on evaluation (reliant on UEs)  ?Balance: ?Balance ?Balance Assessed: Yes ?Static Sitting Balance ?Static Sitting - Level of Assistance: 5: Stand by assistance;4: Min assist ?Dynamic Sitting Balance ?Dynamic Sitting - Level of Assistance: 4: Min assist;3: Mod assist ?Exercises: ?  ?Other Treatments:   ? ? ? ?Therapy/Group: Individual Therapy ? ?Shivani Barrantes ?01/01/2022, 4:07 PM  ?

## 2022-01-01 NOTE — Progress Notes (Signed)
Family asked that VAST return at a latter time. Tomasita Morrow, RN VAST ?

## 2022-01-01 NOTE — Progress Notes (Signed)
Bilateral lower extremity venous duplex completed. ?Refer to "CV Proc" under chart review to view preliminary results. ? ?01/01/2022 3:53 PM ?Eula Fried., MHA, RVT, RDCS, RDMS   ?

## 2022-01-02 ENCOUNTER — Inpatient Hospital Stay (HOSPITAL_COMMUNITY): Payer: Medicare Other

## 2022-01-02 DIAGNOSIS — G9511 Acute infarction of spinal cord (embolic) (nonembolic): Secondary | ICD-10-CM | POA: Diagnosis not present

## 2022-01-02 LAB — BASIC METABOLIC PANEL
Anion gap: 3 — ABNORMAL LOW (ref 5–15)
BUN: 33 mg/dL — ABNORMAL HIGH (ref 8–23)
CO2: 26 mmol/L (ref 22–32)
Calcium: 8.1 mg/dL — ABNORMAL LOW (ref 8.9–10.3)
Chloride: 106 mmol/L (ref 98–111)
Creatinine, Ser: 0.82 mg/dL (ref 0.61–1.24)
GFR, Estimated: >60 mL/min (ref >60)
Glucose, Bld: 95 mg/dL (ref 70–99)
Potassium: 4.6 mmol/L (ref 3.5–5.1)
Sodium: 135 mmol/L (ref 135–145)

## 2022-01-02 LAB — HEPATIC FUNCTION PANEL
ALT: 47 U/L — ABNORMAL HIGH (ref 0–44)
AST: 72 U/L — ABNORMAL HIGH (ref 15–41)
Albumin: 2.6 g/dL — ABNORMAL LOW (ref 3.5–5.0)
Alkaline Phosphatase: 34 U/L — ABNORMAL LOW (ref 38–126)
Bilirubin, Direct: 0.4 mg/dL — ABNORMAL HIGH (ref 0.0–0.2)
Indirect Bilirubin: 2.1 mg/dL — ABNORMAL HIGH (ref 0.3–0.9)
Total Bilirubin: 2.5 mg/dL — ABNORMAL HIGH (ref 0.3–1.2)
Total Protein: 4.5 g/dL — ABNORMAL LOW (ref 6.5–8.1)

## 2022-01-02 IMAGING — US US ABDOMEN LIMITED
1 series · 14 of 25 positions shown · non-contrast
Comparison: CTA abdomen [DATE]

CLINICAL DATA: Abnormal LFTs.

EXAM:
ULTRASOUND ABDOMEN LIMITED RIGHT UPPER QUADRANT

[Series 1: us abdomen limited ruq (liver/gb) · 55 acquisitions, 14 frames shown]
[im 1/55]
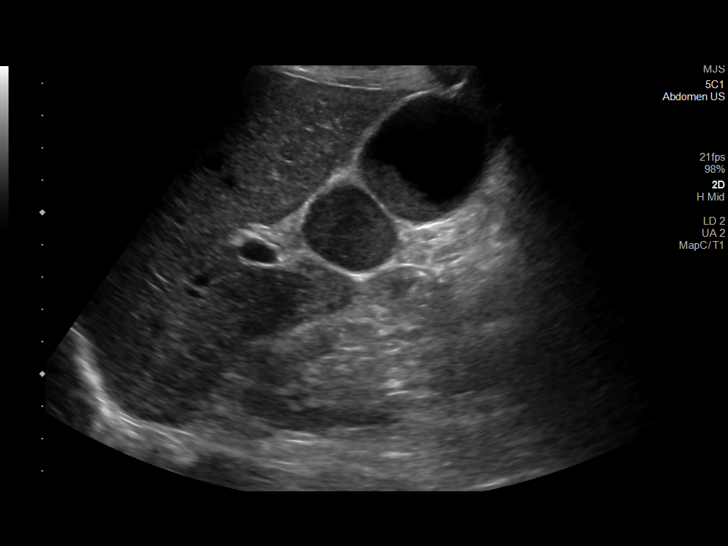
[im 5/55]
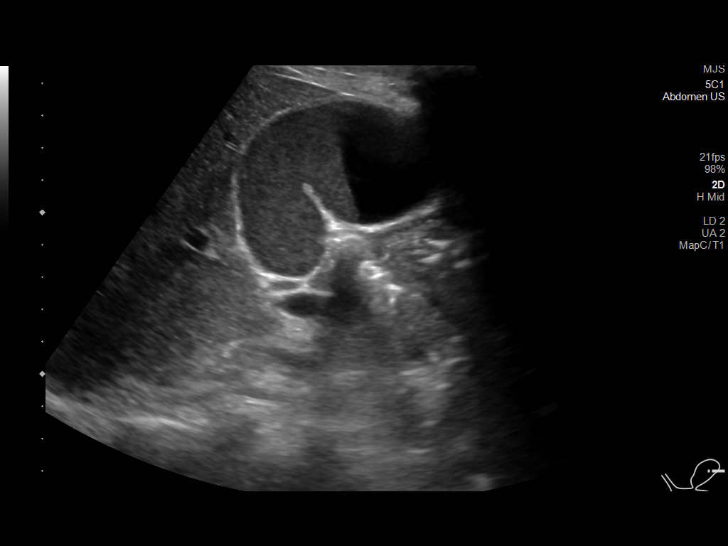
[im 10/55]
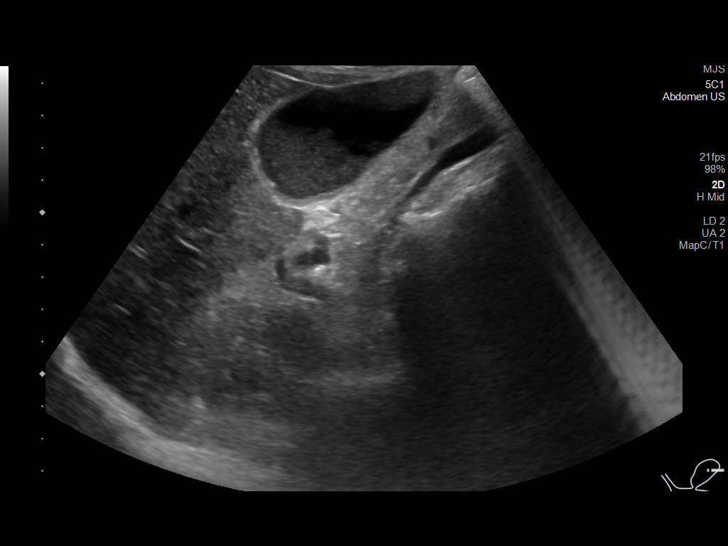
[im 14/55]
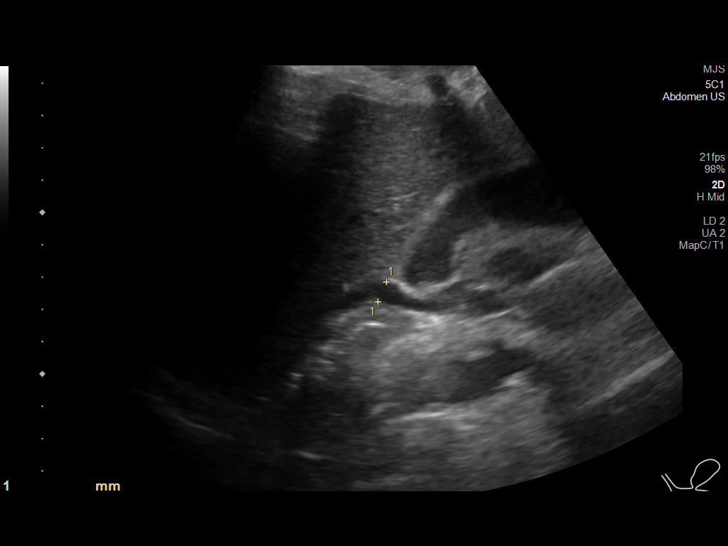
[im 19/55]
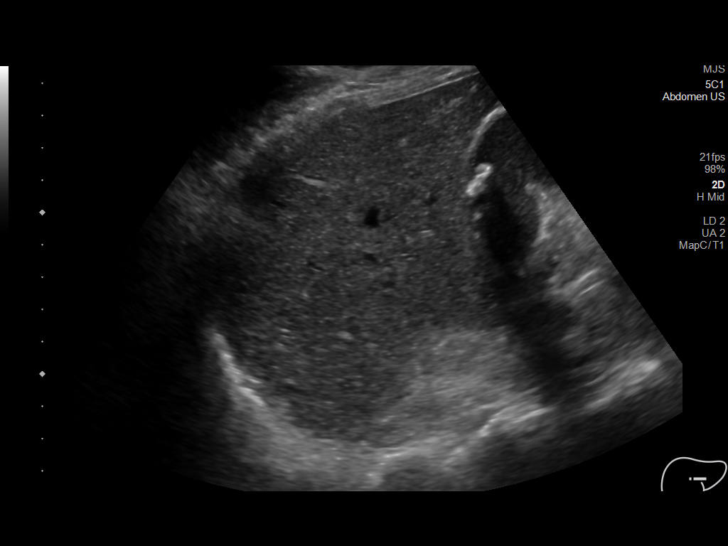
[im 21/55]
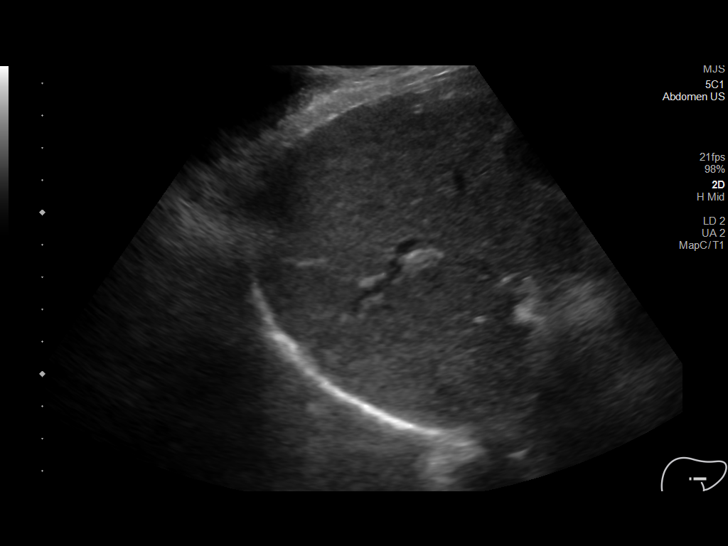
[im 25/55]
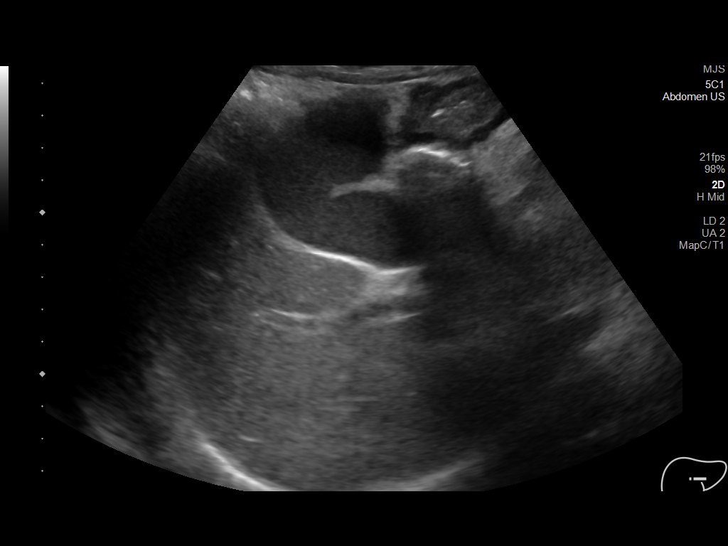
[im 30/55]
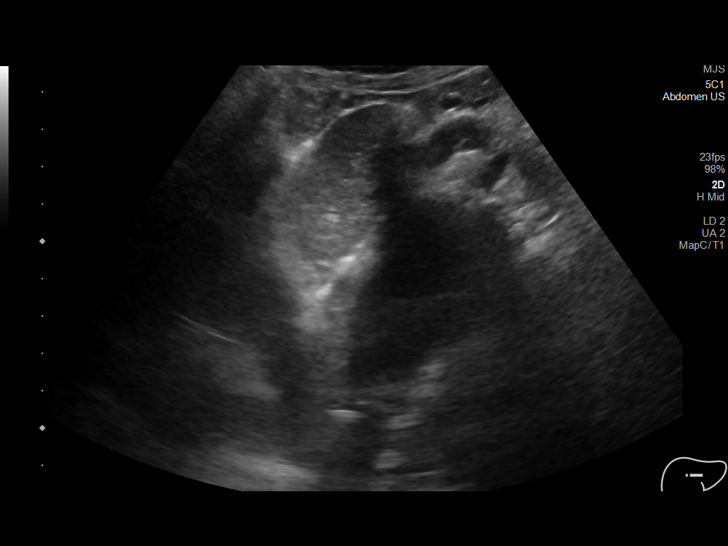
[im 34/55]
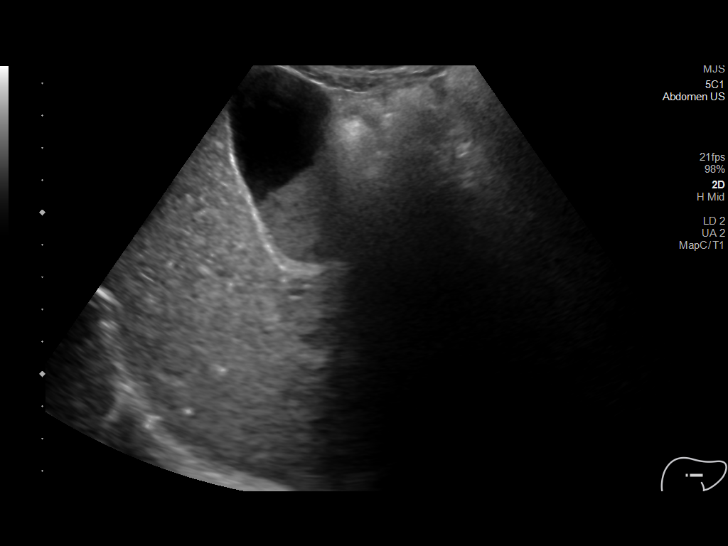
[im 37/55]
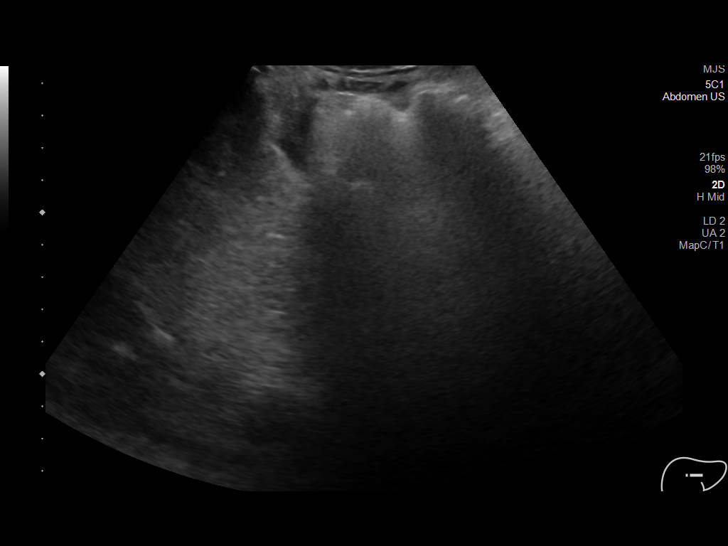
[im 41/55]
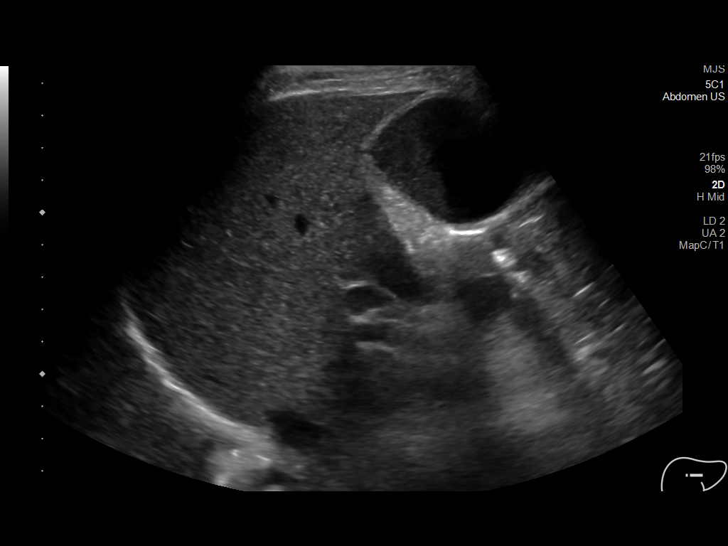
[im 46/55]
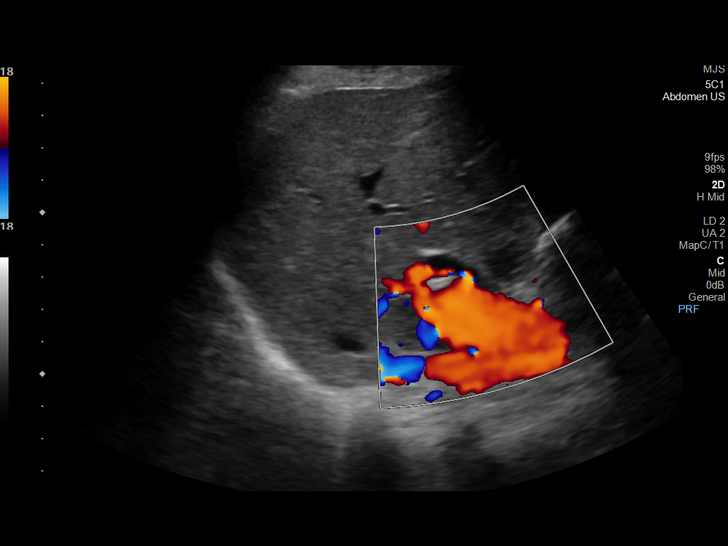
[im 50/55]
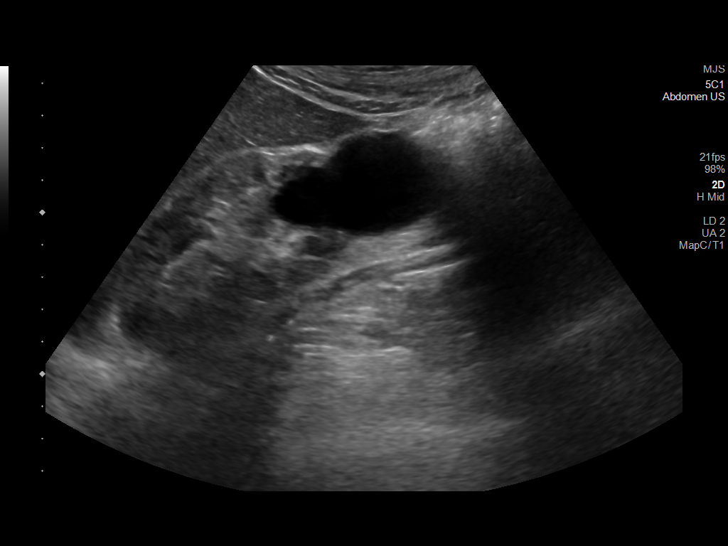
[im 55/55]
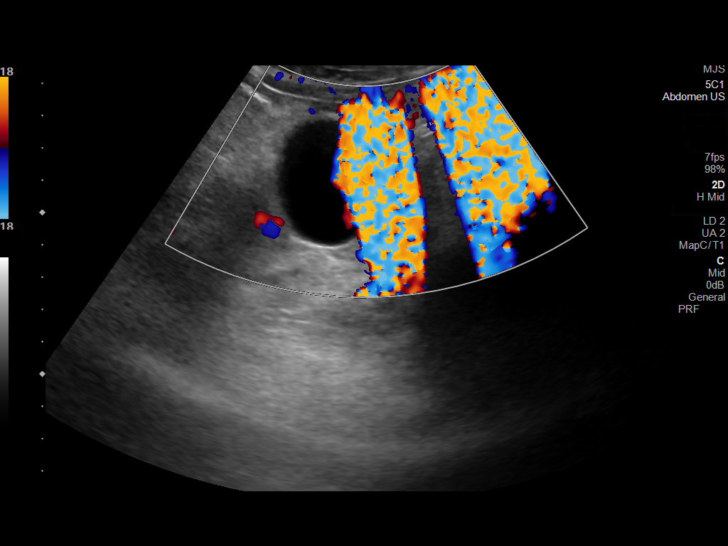

[14 of 25 positions shown; findings below may reference images not displayed]

FINDINGS: Gallbladder:

Multiple hyperechoic shadowing gallstones are seen, measuring up to
10 mm. Hypoechoic nonshadowing gallbladder sludge is also seen. No
gallbladder wall thickening, pericholecystic fluid, or sonographic
Murphy's sign.

Common bile duct:

Diameter: 6.7 mm.  No intrahepatic biliary ductal dilatation.

Liver:

No focal lesion identified. Within normal limits in parenchymal
echogenicity. Portal vein is patent on color Doppler imaging with
normal direction of blood flow towards the liver.

Other: None.
IMPRESSION: :
IMPRESSION: Cholelithiasis and gallbladder sludge without sonographic evidence
of acute cholecystitis.

## 2022-01-02 MED ORDER — TRAZODONE HCL 50 MG PO TABS
75.0000 mg | ORAL_TABLET | Freq: Every day | ORAL | Status: DC
  Administered 2022-01-02 – 2022-01-03 (×2): 75 mg via ORAL
  Filled 2022-01-02 (×2): qty 2

## 2022-01-02 MED ORDER — SIMVASTATIN 20 MG PO TABS
20.0000 mg | ORAL_TABLET | Freq: Every day | ORAL | Status: DC
  Administered 2022-01-02 – 2022-01-25 (×24): 20 mg via ORAL
  Filled 2022-01-02 (×24): qty 1

## 2022-01-02 MED ORDER — TAMSULOSIN HCL 0.4 MG PO CAPS
0.4000 mg | ORAL_CAPSULE | Freq: Every day | ORAL | Status: DC
  Administered 2022-01-02 – 2022-01-04 (×3): 0.4 mg via ORAL
  Filled 2022-01-02 (×3): qty 1

## 2022-01-02 MED ORDER — GERHARDT'S BUTT CREAM
TOPICAL_CREAM | CUTANEOUS | Status: DC | PRN
  Administered 2022-01-02: 1 via TOPICAL
  Filled 2022-01-02: qty 1

## 2022-01-02 MED ORDER — CHILDRENS CHEW MULTIVITAMIN PO CHEW
1.0000 | CHEWABLE_TABLET | Freq: Two times a day (BID) | ORAL | Status: DC
  Administered 2022-01-03 – 2022-01-26 (×45): 1 via ORAL
  Filled 2022-01-02 (×48): qty 1

## 2022-01-02 NOTE — Progress Notes (Signed)
Pts foley catheter removed per order at 1218pm. Pt tolerated well, no complications. 700 ml urine output in drainage bag. Educated patient and family he has 8 hours to void (20:18). Pt and family showed understanding. No further questions at this time.  ? ? ?Trey Sailors LPN ?

## 2022-01-02 NOTE — Progress Notes (Signed)
Physical Therapy Session Note ? ?Patient Details  ?Name: Allen Arias ?MRN: [id number] ?Date of Birth: 1941-01-20 ? ?Today's Date: 01/02/2022 ?PT Individual Time: 1105-1200 ?PT Individual Time Calculation (min): 55 min  ? ?Short Term Goals: ?Week 1:  PT Short Term Goal 1 (Week 1): Will be able to perform bed to chair transfer with slideboard minA ?PT Short Term Goal 2 (Week 1): Will be able to perform supine to sit with modA ?PT Short Term Goal 3 (Week 1): Will be able to maintain dynamic sitting balance during reaching activity with feet supported minA ?PT Short Term Goal 4 (Week 1): Pt will initiate w/c mobility ? ?Skilled Therapeutic Interventions/Progress Updates: Pt presented in TIS with family present. [employer] tele interpreter used: Jeannie [phone] throughout session. Pt states some soreness at incision area but did not rate and no pain behaviors noted. Session focused on sitting balance primarily. Pt transported to ortho gym and pt performed Slide board transfer to mat with modA. Pt required multimodal cues for increased anterior lean and improved head/hips relationship. Pt participated in various seated balance activities including reaching with moderate challenges outside BOS. Pt also performed shoulder flexion to 90 with use of lightweight dowel 2 x 10. Performed reaching with min challenge for increase forward lean with 1Kg weighted ball 2 x 5. Pt noted to be fearful to reach lean anteriorly but able to perform with some control and was able to use core ms to return to midline vs pushing with UE. Performed seated chops with 1Kg weighted ball x 10 ea direction. Pt also participated in reaching anteriorly and tossing bean bags to target x 10. PTA facilitating anterior lean with pt demonstrating some resistance due to fear of falling. Performed Slide board transfer back to TIS with minA and improved head/hips relationship. In TIS pt was able to perform AA LAQ x 10 on LLE. Pt transported back to room and performed  Slide board transfer to bed per nsg request. Slide board transfer performed in same manner as prior. Pt required maxA sit to supine for BLE management and truncal support. Pt repositioned to comfort and left with bed alarm on, call bell within reach and needs met.  ? ?Tx2: Pt presented in bed agreeable to therapy. [employer] interpreter not available therefore son agreeable to assist with interpretation. Session focused on bed mobility and supine to/from sit. PTA provided pt with gait belt and pt was able to manage RLE off EOB. Pt was able to initiate moving LLE towards EOB with PTA providing minA. Pt instructed in reaching for bed rail to come to sidelying and with modA was able to complete transition to sitting. Pt required modA for BLE management to return to sidelying and was able to roll to supine with supervision. Block practice of this technique performed x 3 to form EOB with pt demonstrating improved understanding. On third attempt pt was able to move LLE to EOB with decreased assistance. While at EOB pt practiced lateral scoots to [facility] requiring minA and cues to push through LLE to supporting block to assist with anterior lean and push off. Pt also participated in lateral leans to L/R x 6 for oblique activation. Once pt back in supine performed AA supine therex as follows: LLE AA heel slides, AA hip abd/add, AA ankle pumps x 10. RLE PROM DF/PF, hip ER/IR, pt was able to push RLE into extension with PTA supporting leg. PTA obtained small physioball and pt was able to perform knee flexion (knee to chest) with LE  placed on ball x 10. Session ended early due to transport arriving to take pt to [place]. Pt left in bed with family present and current needs met.  ?   ? ?Therapy Documentation ?Precautions:  ?Precautions ?Precautions: Fall ?Precaution Comments: foley ?Restrictions ?Weight Bearing Restrictions: No ?General: ?  ?Vital Signs: ?  ?Pain: ?Pain Assessment ?Pain Scale: 0-10 ?Pain Score: 8  ?Pain Type: Acute  pain ?Pain Location: Leg ?Pain Orientation: Left ?Pain Intervention(s): Medication (See eMAR) ?Mobility: ?  ?Locomotion : ?   ?Trunk/Postural Assessment : ?   ?Balance: ?  ?Exercises: ?  ?Other Treatments:   ? ? ? ?Therapy/Group: Individual Therapy ? ?Woodley Petzold ?01/02/2022, 12:26 PM  ?

## 2022-01-02 NOTE — Progress Notes (Signed)
?                                                       PROGRESS NOTE ? ? ?Subjective/Complaints: ? ?Pt reports feeling a little better/stronger- not eating much- admits not drinking a lot either.  ?We discussed Celexa vs not taking- I think it could be beneficial.  ? ? ?ROS: ?Limited by language ? ?Objective: ?  ?DG Abd Portable 1V ? ?Result Date: 01/01/2022 ?CLINICAL DATA:  Neurogenic bowel, constipation EXAM: PORTABLE ABDOMEN - 1 VIEW COMPARISON:  12/26/2021, CTA 11/04/2021 FINDINGS: Nonobstructive bowel gas pattern. Small to moderate stool within the proximal colon. The distal colon appears evacuated. 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 Cholelithiasis noted. A 8 mm calcification is seen within the right paraspinal region corresponding to the a gallbladder neck on prior CT arteriogram and stable since prior examination. No acute bone abnormality. IMPRESSION: Normal abdominal gas pattern.  Small to moderate stool burden. Cholelithiasis. Fixed calculus in the expected location of the gallbladder neck when compared to prior examination. Correlation with liver enzymes would be helpful in excluding an obstructive or inflammatory process. Electronically Signed   By: Helyn Numbers M.D.   On: 01/01/2022 18:02  ? ?VAS [place] LOWER EXTREMITY VENOUS (DVT) ? ?Result Date: 01/01/2022 ? Lower Venous DVT Study Patient Name:  Allen Arias  Date of Exam:   01/01/2022 Medical Rec #: [id number]   Accession #:    [id number] Date of Birth: [birth year]    Patient Gender: M Patient Age:   81 years Exam Location:  [facility] Procedure:      VAS [place] LOWER EXTREMITY VENOUS (DVT) Referring Phys: PAMELA LOVE --------------------------------------------------------------------------------  Indications: Edema.  Comparison Study: No prior study Performing Technologist: Gertie Fey MHA, RDMS, RVT, RDCS  Examination Guidelines: A complete evaluation includes B-mode  imaging, spectral Doppler, color Doppler, and power Doppler as needed of all accessible portions of each vessel. Bilateral testing is considered an integral part of a complete examination. Limited examinations for reoccurring indications may be performed as noted. The reflux portion of the exam is performed with the patient in reverse Trendelenburg.  +---------+---------------+---------+-----------+----------+--------------+ RIGHT    CompressibilityPhasicitySpontaneityPropertiesThrombus Aging +---------+---------------+---------+-----------+----------+--------------+ CFV      Full           Yes      Yes                                 +---------+---------------+---------+-----------+----------+--------------+ SFJ      Full                                                        +---------+---------------+---------+-----------+----------+--------------+ FV Prox  Full                                                        +---------+---------------+---------+-----------+----------+--------------+ FV Mid   Full                                                        +---------+---------------+---------+-----------+----------+--------------+  FV DistalFull                                                        +---------+---------------+---------+-----------+----------+--------------+ PFV      Full                                                        +---------+---------------+---------+-----------+----------+--------------+ POP      Full           Yes      Yes                                 +---------+---------------+---------+-----------+----------+--------------+ PTV      Full                                                        +---------+---------------+---------+-----------+----------+--------------+ PERO     Full                                                        +---------+---------------+---------+-----------+----------+--------------+    +---------+---------------+---------+-----------+----------+--------------+ LEFT     CompressibilityPhasicitySpontaneityPropertiesThrombus Aging +---------+---------------+---------+-----------+----------+--------------+ CFV      Full           Yes      Yes                                 +---------+---------------+---------+-----------+----------+--------------+ SFJ      Full                                                        +---------+---------------+---------+-----------+----------+--------------+ FV Prox  Full                                                        +---------+---------------+---------+-----------+----------+--------------+ FV Mid   Full                                                        +---------+---------------+---------+-----------+----------+--------------+ FV DistalFull                                                        +---------+---------------+---------+-----------+----------+--------------+  PFV      Full                                                        +---------+---------------+---------+-----------+----------+--------------+ POP      Full           Yes      Yes                                 +---------+---------------+---------+-----------+----------+--------------+ PTV      Full                                                        +---------+---------------+---------+-----------+----------+--------------+ PERO     Full                                                        +---------+---------------+---------+-----------+----------+--------------+     Summary: RIGHT: - There is no evidence of deep vein thrombosis in the lower extremity.  - No cystic structure found in the popliteal fossa.  LEFT: - There is no evidence of deep vein thrombosis in the lower extremity.  - No cystic structure found in the popliteal fossa.  *See table(s) above for measurements and observations. Electronically signed  by Heath Larkhomas Hawken on 01/01/2022 at 5:06:54 PM.    Final   ? ?US Abdomen Limited RUQ (LIVER/GB) ? ?Result Date: 01/02/2022 ?CLINICAL DATA:  Abnormal LFTs. EXAM: ULTRASOUND ABDOMEN LIMITED RIGHT UPPER QUADRANT COMPARISON:  CTA abdomen 12/28/2021 FINDINGS: Gallbladder: Multiple hyperechoic shadowing gallstones are seen, measuring up to 10 mm. Hypoechoic nonshadowing gallbladder sludge is also seen. No gallbladder wall thickening, pericholecystic fluid, or sonographic Murphy's sign. Common bile duct: Diameter: 6.7 mm.  No intrahepatic biliary ductal dilatation. Liver: No focal lesion identified. Within normal limits in parenchymal echogenicity. Portal vein is patent on color Doppler imaging with normal direction of blood flow towards the liver. Other: None. IMPRESSION:: IMPRESSION: Cholelithiasis and gallbladder sludge without sonographic evidence of acute cholecystitis. Electronically Signed   By: Neita Garnetonald  Viola M.D.   On: 01/02/2022 15:38   ?Recent Labs  ?  01/01/22 ?[id number]  ?WBC 5.7  ?HGB 9.1*  ?HCT 26.6*  ?PLT 73*  ? ?Recent Labs  ?  01/01/22 ?[id number] 01/02/22 ?[id number]  ?NA 137 135  ?K 3.7 4.6  ?CL 106 106  ?CO2 24 26  ?GLUCOSE 87 95  ?BUN 38* 33*  ?CREATININE 0.84 0.82  ?CALCIUM 8.0* 8.1*  ? ? ?Intake/Output Summary (Last 24 hours) at 01/02/2022 1613 ?Last data filed at 01/02/2022 1210 ?Gross per 24 hour  ?Intake 398.96 ml  ?Output 1200 ml  ?Net -801.04 ml  ?  ? ?  ? ?Physical Exam: ?Vital Signs ?Blood pressure 139/65, pulse 77, temperature 98.1 ?F (36.7 ?C), temperature source Oral, resp. rate 15, height 5\' 4"  (1.626 m), weight 50 kg, SpO2 99 %. ? ? ? ?General: awake, alert, appropriate, supine in bed; NAD ?HENT: conjugate gaze; oropharynx moist ?  CV: regular rate; no JVD ?Pulmonary: CTA B/L; no W/R/R- good air movement ?GI: soft, NT, ND, (+)BS- hypoactive BS ?Psychiatric: appropriate- flat affect ?Neurological: aler-t son interpreting for [place] ?MS: 1/5 in RLE; and 2-/5 in LLE ? ?Assessment/Plan: ?1. Functional deficits which  require 3+ hours per day of interdisciplinary therapy in a comprehensive inpatient rehab setting. ?Physiatrist is providing close team supervision and 24 hour management of active medical problems listed below. ?Physiatrist and rehab team continue to assess barriers to discharge/monitor patient progress toward functional and medical goals ? ?Ca

## 2022-01-02 NOTE — Progress Notes (Signed)
Discussed concerns re: bile duct stone and rationale for GB ultrsound. LFTs showed elevation in direct bilirubin --patient denies any N/V or abdominal pain. No CBD stone or evidence of acute cholecystitis.  Daughter at bedside updated. Evening meds changed to 8 pm and trazodone increased to 75 mg to help with sleep.  ?

## 2022-01-02 NOTE — Progress Notes (Addendum)
Inpatient Rehabilitation Care Coordinator ?Assessment and Plan ?Patient Details  ?Name: Allen Arias ?MRN: [id number] ?Date of Birth: September 10, 1940 ? ?Today's Date: 01/02/2022 ? ?Hospital Problems: Principal Problem: ?  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 ?Past Medical History:  ?Past Medical History:  ?Diagnosis Date  ? AAA (abdominal aortic aneurysm) (HCC)   ? BPH (benign prostatic hyperplasia)   ? Elevated lipids   ? Hypertension   ? Nocturia   ? ?Past Surgical History:  ?Past Surgical History:  ?Procedure Laterality Date  ? ABDOMINAL AORTIC ENDOVASCULAR STENT GRAFT N/A 12/26/2021  ? Procedure: ABDOMINAL AORTIC ENDOVASCULAR STENT GRAFT;  Surgeon: Maeola Harman, MD;  Location: [facility] OR;  Service: Vascular;  Laterality: N/A;  ? CARPAL TUNNEL RELEASE    ? ENDARTERECTOMY FEMORAL Right 12/26/2021  ? Procedure: RIGHT ENDARTERECTOMY FEMORAL;  Surgeon: Maeola Harman, MD;  Location: [facility] OR;  Service: Vascular;  Laterality: Right;  ? ENDARTERECTOMY POPLITEAL Left 12/26/2021  ? Procedure: LEFT ENDARTERECTOMY POPLITEAL WITH ANGIOPLASTY USING XENOSURE BIOPATCH (1cmX6cm);  Surgeon: Maeola Harman, MD;  Location: [facility] OR;  Service: Vascular;  Laterality: Left;  ? EYE SURGERY    ? PATCH ANGIOPLASTY Bilateral 12/26/2021  ? Procedure: PATCH ANGIOPLASTY USING XENOSURE BIOLOGIC PATCH (1cmx6cm);  Surgeon: Maeola Harman, MD;  Location: [facility] OR;  Service: Vascular;  Laterality: Bilateral;  ? THROMBECTOMY FEMORAL ARTERY Bilateral 12/26/2021  ? Procedure: BILATERAL FEMORAL ARTERY THROMBECTOMY;  Surgeon: Maeola Harman, MD;  Location: [facility] OR;  Service: Vascular;  Laterality: Bilateral;  ? ULTRASOUND GUIDANCE FOR VASCULAR ACCESS Bilateral 12/26/2021  ? Procedure: ULTRASOUND GUIDANCE FOR VASCULAR ACCESS;  Surgeon: Maeola Harman, MD;  Location: [facility] OR;  Service: Vascular;  Laterality: Bilateral;  ? ?Social History:  reports that he has never smoked. He has never used smokeless  tobacco. He reports that he does not drink alcohol and does not use drugs. ? ?Family / Support Systems ?Marital Status: Married ?How Long?: since 1973; 50 yrs ?Patient Roles: Spouse, Parent ?Spouse/Significant Other: Allen Arias ?Children: Two adult children- Insurance underwriter (dtr) and Allen Arias (son; lives in [place]. Currently visitng but will be returning soon). ?Other Supports: None reported ?Anticipated Caregiver: TBD ?Ability/Limitations of Caregiver: Pt dtr reports her father was primary caregiver for his wife. Pt dtr reports that patient coming to stay in her home is not an option. She can help PRN, but is considering other supports that may be availabe. Possibly SNF if her father will; need 24/7 care. ?Caregiver Availability: Intermittent ?Family Dynamics: Pt lives with his wife who has diagnisos of dementia; he is her primary caregiver. ? 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 ?Education: college ?Health Literacy - How often do you need to have someone help you when you read instructions, pamphlets, or other written material from your doctor or pharmacy?: Never ?Writes: Yes ?Employment Status: Retired ?Date Retired/Disabled/Unemployed: 2001. Pt dtr reports her father retired when he moved here to [place] 20 yrs ago. ?Age Retired: 43 ?Legal History/Current Legal Issues: Denies ?Guardian/Conservator: N/A  ? ?Abuse/Neglect ?Abuse/Neglect Assessment Can Be Completed: Yes ?Physical Abuse: Denies ?Verbal Abuse: Denies ?Sexual Abuse: Denies ?Exploitation of patient/patient's resources: Denies ?Self-Neglect: Denies ? ?Patient response to: ?Social Isolation - How often do you feel lonely or isolated from those around you?: Never ? ?Emotional Status ?Pt's affect, behavior and adjustment status: Pt gaurded at time of visit, as the feeling of  SW questions were intrusive and "does not  have anything to do with my health." His wife became increasingly anxious during this time as well. SW ended questions with pt. completed assessment with his dtr Allen Arias. During assessment, she reported she could see the reason they did not want to complete because the series of questions, as they are very overwhelmed with all the new information they have received about her father's condition. ?Recent Psychosocial Issues: Dtr denies Pt could benefit from neuropsych given the nature of his condition and lifestyle change, however, culturally not sure if he would not accept this idea. ?Psychiatric History: Dtr denies ?Substance Abuse History: Dtr reports her father quit smoking cigarettes 30 yrs ago. Reports he drinks on rare occassions. ? ?Patient / Family Perceptions, Expectations & Goals ?Pt/Family understanding of illness & functional limitations: Pt dtr has general understanding of pt care needs. ?Premorbid pt/family roles/activities: Independent ?Anticipated changes in roles/activities/participation: Assistance with ADLs/IADLs ?Pt/family expectations/goals: His dtr would like for him to be able to use his legs and walk or atleast be able to stand up. ? ?[facility] ?Community Agencies: None ?Premorbid Home Care/DME Agencies: None ?Transportation available at discharge: TBD ?Is the patient able to respond to transportation needs?: Yes ?In the past 12 months, has lack of transportation kept you from medical appointments or from getting medications?: No ?In the past 12 months, has lack of transportation kept you from meetings, work, or from getting things needed for daily living?: No ?Resource referrals recommended: Neuropsychology ? ?Discharge Planning ?Living Arrangements: Spouse/significant other ?Support Systems: Children ?Type of Residence: Private residence ?Insurance Resources: [employer], [employer] (specify county) [facility]) ?Financial Resources: Social Security ?Financial Screen  Referred: No ?Living Expenses: Own ?Money Management: Patient ?Does the patient have any problems obtaining your medications?: No ?Home Management: Pt managed all homecare needs ?Patient/Family Preliminary Plans: TBD ?Care Coordinator Barriers to Discharge: Decreased caregiver support, Lack of/limited family support ?Care Coordinator Anticipated Follow Up Needs: HH/OP ? ?Clinical Impression ?SW initially made efforts to complete assessment with pt using [employer] Interpreter Judeth Cornfield [id number]. Pt not comfortable. SW completed assessment with his dtr Allen Arias. No HCPOA, states they heavily rely on her to make decisions. Pt is not a Cytogeneticist. No DME. SW discussed discharge plan in detail. SW explained SNF placement process, and encouraged her to apply for LTC Medicaid. SW left authorization of rep form at front desk for her to complete so SW can provide to DSS CM.  ? ?Gretchen Short ?01/02/2022, 10:56 AM ? ?  ?

## 2022-01-02 NOTE — Progress Notes (Signed)
Occupational Therapy Session Note ? ?Patient Details  ?Name: Shabaka Flattery ?MRN: [id number] ?Date of Birth: 1941-05-15 ? ?Today's Date: 01/02/2022 ?OT Individual Time: [phone] ?OT Individual Time Calculation (min): 55 min  ? ? ?Short Term Goals: ?Week 1:  OT Short Term Goal 1 (Week 1): Pt will sit EOB dynamically with no more than MIN A for 5 min ?OT Short Term Goal 2 (Week 1): Pt will compeltes SB transfer to [facility] wiht MOD A ?OT Short Term Goal 3 (Week 1): Pt will maintain circle sitting wiht MIN A in prep for LB dressing for 5 min ? ?Skilled Therapeutic Interventions/Progress Updates:  ?  Treatment session with focus on bed mobility, sitting balance during self-care retraining, and functional transfers.  Pt received supine in bed with wife and son present throughout session.  Utilized video interpreter #[id number] for interpretation during session.  Engaged in rolling R and L with pt able to utilize bed rails to pull UB over in bed, requiring physical assistance for placement and positioning of BLE.  Pt incontinent of bowel, therefore engaged in hygiene in sidelying.  Pt completed bed mobility max assist to come to sitting EOB.  Pt able to maintain static sitting balance with min assist and min-mod assist for dynamic sitting balance when donning shirt.  Pt able to don shirt with increased time and encouragement and assist for sitting balance.  Pt completed slide board transfer to w/c with total assist to place slide board and then max assist fading to mod assist during transfer to pt's L.  Pt able to scoot last few with UE support on w/c arm rest.  Engaged in grooming tasks seated at sink with setup for items.  Engaged in reaching forward and diagonally to obtain items while seated at sink to increase challenge of trunk control.  Pt required min assist for increased reach outside BOS.  Pt remained semi-reclined in TIS w/c with BLE elevated, seat belt alarm on and family present and all needs in reach. ? ?Therapy  Documentation ?Precautions:  ?Precautions ?Precautions: Fall ?Precaution Comments: foley ?Restrictions ?Weight Bearing Restrictions: No ?General: ?  ?Vital Signs: ?Therapy Vitals ?Temp: 98.6 ?F (37 ?C) ?Temp Source: Oral ?Pulse Rate: 81 ?Resp: 16 ?BP: 129/72 ?Patient Position (if appropriate): Lying ?Oxygen Therapy ?SpO2: 100 % ?O2 Device: Room Air ?Pain: ? Pt with c/o pain but did not rate or identify specific location. ? ? ? ?Therapy/Group: Individual Therapy ? ?Simonne Come ?01/02/2022, 9:42 AM ?

## 2022-01-02 NOTE — Progress Notes (Addendum)
Patient ID: Allen Arias, male   DOB: Dec 29, 1940, 81 y.o.   MRN: [id number] ? ?SW spoke with pt dtr Allen Arias to discuss if she received forms. Reports she picked up already and will get back to [place] shared pt ELOS 3-4 weeks and SW will follow-up after team conference with updates. ? ?SW called [place] [phone]) to inquire about pt assigned MCD CM. Reports pt has disability Medicaid, and SW can speak with any CM. SW left message for Ms. [place] MCD  to discuss if pt eligible for LTC Medicaid, and a fax number to submit authorization of representative form, and waiting on follow-up.  ?*SW received return phone call stating fax [phone], and pt is able to apply for LTC MCD.  ? ?Loralee Pacas, MSW, LCSWA ?Office: [phone] ?Cell: [phone] ?Fax: [phone]  ?

## 2022-01-02 NOTE — Care Management (Signed)
Inpatient Rehabilitation Center ?Individual Statement of Services ? ?Patient Name:  Allen Arias  ?Date:  01/02/2022 ? ?Welcome to the [facility].  Our goal is to provide you with an individualized program based on your diagnosis and situation, designed to meet your specific needs.  With this comprehensive rehabilitation program, you will be expected to participate in at least 3 hours of rehabilitation therapies Monday-Friday, with modified therapy programming on the weekends. ? ?Your rehabilitation program will include the following services:  Physical Therapy (PT), Occupational Therapy (OT), 24 hour per day rehabilitation nursing, Therapeutic Recreaction (TR), Psychology, Neuropsychology, Care Coordinator, Rehabilitation Medicine, Nutrition Services, Pharmacy Services, and Other ? ?Weekly team conferences will be held on Tuesdays to discuss your progress.  Your Inpatient Rehabilitation Care Coordinator will talk with you frequently to get your input and to update you on team discussions.  Team conferences with you and your family in attendance may also be held. ? ?Expected length of stay: 3-4 weeks ? ?Overall anticipated outcome: Minimal Assistance ? ?Depending on your progress and recovery, your program may change. Your Inpatient Rehabilitation Care Coordinator will coordinate services and will keep you informed of any changes. Your Inpatient Rehabilitation Care Coordinator's name and contact numbers are listed  below. ? ?The following services may also be recommended but are not provided by the [facility]:  ?Driving Evaluations ?Home Health Rehabiltiation Services ?Outpatient Rehabilitation Services ?Vocational Rehabilitation ?  ?Arrangements will be made to provide these services after discharge if needed.  Arrangements include referral to agencies that provide these services. ? ?Your insurance has been verified to be:  Medicare A/B ? ?Your primary doctor is:  Bernerd Limbo ? ?Pertinent information will be shared with your doctor and your insurance company. ? ?Inpatient Rehabilitation Care Coordinator:  Cathleen Corti [phone] or (C) [phone] ? ?Information discussed with and copy given to patient by: Rana Snare, 01/02/2022, 11:12 AM    ?

## 2022-01-02 NOTE — IPOC Note (Signed)
Overall Plan of Care (IPOC) ?Patient Details ?Name: Allen Arias ?MRN: [id number] ?DOB: 10/19/1940 ? 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 ?Hospital Problems: Principal Problem: ?  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 ? ? ? Functional Problem List: ?Nursing Bladder, Bowel, Edema, Endurance, Medication Management, Perception, Safety, Sensory, Skin Integrity  ?PT Balance, Endurance, Motor, Pain, Safety  ?OT Balance, Cognition, Endurance, Motor, Perception, Safety, Sensory  ?SLP    ?TR    ?    ? Basic ADL?s: ?OT Grooming, Bathing, Dressing, Toileting  ? ?  Advanced  ADL?s: ?OT    ?   ?Transfers: ?PT Bed Mobility, Bed to Chair, Car, Furniture, Floor  ?OT Toilet, Tub/Shower  ? ?  Locomotion: ?PT Wheelchair Mobility, Ambulation, Stairs  ? ?  Additional Impairments: ?OT    ?SLP   ?  ?   ?TR    ? ? ?Anticipated Outcomes ?Item Anticipated Outcome  ?Self Feeding S  ?Swallowing ?   ?  ?Basic self-care ? MIN  ?Toileting ? MIN ?  ?Bathroom Transfers MIN  ?Bowel/Bladder ? min assist  ?Transfers ? min A  ?Locomotion ? min A at w/c level  ?Communication ?    ?Cognition ?    ?Pain ? n/a  ?Safety/Judgment ? min assist  ? ?Therapy Plan: ?PT Intensity: Minimum of 1-2 x/day ,45 to 90 minutes ?PT Frequency: 5 out of 7 days ?PT Duration Estimated Length of Stay: 4 weeks ?OT Intensity: Minimum of 1-2 x/day, 45 to 90 minutes ?OT Frequency: 5 out of 7 days ?OT Duration/Estimated Length of Stay: 3-4 weeks ?   ? ?Due to the current state of emergency, patients may not be receiving their 3-hours of Medicare-mandated therapy. ? ? Team Interventions: ?Nursing Interventions Patient/Family Education, Bladder Management, Bowel Management, Disease Management/Prevention, Medication Management, Skin Care/Wound Management, Discharge Planning  ?PT interventions Ambulation/gait training, Warden/ranger, Discharge planning, DME/adaptive equipment instruction, Functional mobility training, Neuromuscular  re-education, Pain management, Patient/family education, Therapeutic Activities, Therapeutic Exercise, UE/LE Strength taining/ROM, Wheelchair propulsion/positioning, Community reintegration, Disease management/prevention, Functional electrical stimulation, Psychosocial support, Splinting/orthotics, Stair training, UE/LE Coordination activities  ?OT Interventions Balance/vestibular training, Discharge planning, Functional electrical stimulation, Pain management, Self Care/advanced ADL retraining, Therapeutic Activities, UE/LE Coordination activities, Visual/perceptual remediation/compensation, Therapeutic Exercise, Skin care/wound managment, Functional mobility training, Patient/family education, Disease mangement/prevention, Cognitive remediation/compensation, Community reintegration, Neuromuscular re-education, DME/adaptive equipment instruction, Psychosocial support, Splinting/orthotics, UE/LE Strength taining/ROM, Wheelchair propulsion/positioning  ?SLP Interventions    ?TR Interventions    ?SW/CM Interventions Discharge Planning, Psychosocial Support, Patient/Family Education  ? ?Barriers to Discharge ?MD  Medical stability, Home enviroment access/loayout, Incontinence, Neurogenic bowel and bladder, Wound care, Weight, and Weight bearing restrictions  ?Nursing Decreased caregiver support, Home environment access/layout, Incontinence, Wound Care, Lack of/limited family support ?2 level, level entry, full flight with left rail. Lives with spouse. Daughter and hired help. Family expects to have 24/7 support.  ?PT Inaccessible home environment, Decreased caregiver support, Neurogenic Bowel & Bladder, Home environment access/layout, Incontinence ?   ?OT Inaccessible home environment, Home environment access/layout, Incontinence, Neurogenic Bowel & Bladder ?   ?SLP   ?   ?SW Decreased caregiver support, Lack of/limited family support ?   ? ?Team Discharge Planning: ?Destination: PT-Home ,OT-   , SLP-  ?Projected  Follow-up: PT-Home health PT, OT-  Home health OT, SLP-  ?Projected Equipment Needs: PT-Sliding board, Wheelchair (measurements), Wheelchair cushion (measurements), OT- 3 in 1 bedside comode, Tub/shower bench, To be determined, SLP-  ?Equipment Details: PT-TBD pending progress, OT-need  to clarify bathroom set up in level of home to determine shower equipment ?Patient/family involved in discharge planning: PT- Patient, Family member/caregiver,  OT-Patient, Family member/caregiver, SLP-  ? ?MD ELOS: 3-4 weeks ?Medical Rehab Prognosis:  Fair ?Assessment: The patient has been admitted for CIR therapies with the diagnosis of incomplete paraplegia. The team will be addressing functional mobility, strength, stamina, balance, safety, adaptive techniques and equipment, self-care, bowel and bladder mgt, patient and caregiver education, . Goals have been set at min Assist. Anticipated discharge destination is home. ? ? ? ?  ? ? ?See Team Conference Notes for weekly updates to the plan of care  ?

## 2022-01-03 DIAGNOSIS — G9511 Acute infarction of spinal cord (embolic) (nonembolic): Secondary | ICD-10-CM | POA: Diagnosis not present

## 2022-01-03 LAB — CBC
HCT: 21.2 % — ABNORMAL LOW (ref 39.0–52.0)
Hemoglobin: 7 g/dL — ABNORMAL LOW (ref 13.0–17.0)
MCH: 31.8 pg (ref 26.0–34.0)
MCHC: 33 g/dL (ref 30.0–36.0)
MCV: 96.4 fL (ref 80.0–100.0)
Platelets: 132 10*3/uL — ABNORMAL LOW (ref 150–400)
RBC: 2.2 MIL/uL — ABNORMAL LOW (ref 4.22–5.81)
RDW: 18.4 % — ABNORMAL HIGH (ref 11.5–15.5)
WBC: 7.7 10*3/uL (ref 4.0–10.5)
nRBC: 0 % (ref 0.0–0.2)

## 2022-01-03 LAB — URINALYSIS, ROUTINE W REFLEX MICROSCOPIC
Bilirubin Urine: NEGATIVE
Glucose, UA: NEGATIVE mg/dL
Hgb urine dipstick: NEGATIVE
Ketones, ur: NEGATIVE mg/dL
Leukocytes,Ua: NEGATIVE
Nitrite: NEGATIVE
Protein, ur: NEGATIVE mg/dL
Specific Gravity, Urine: 1.023 (ref 1.005–1.030)
pH: 5 (ref 5.0–8.0)

## 2022-01-03 NOTE — Discharge Summary (Signed)
[facility] Discharge Summary ? ? ?Patient ID:  ?Allen Arias ?MRN: [id number] ?DOB/AGE: Aug 06, 1941 81 y.o. ? ?Admit date: 12/26/2021 ?Discharge date: 12/31/21 ?Date of Surgery: 12/26/2021 ?Surgeon: Surgeon(s): ?Waynetta Sandy, MD ?Marty Heck, MD ?Cherre Robins, MD ? ?Admission Diagnosis: ?AAA (abdominal aortic aneurysm) ([place]) [I71.40] ?Abdominal aortic aneurysm (AAA) greater than 5.5 cm in diameter in male [facility]) [I71.40] ? ?Discharge Diagnoses:  ?AAA (abdominal aortic aneurysm) ([place]) [I71.40] ?Abdominal aortic aneurysm (AAA) greater than 5.5 cm in diameter in male [facility]) [I71.40] ? ?Secondary Diagnoses: ?Past Medical History:  ?Diagnosis Date  ? AAA (abdominal aortic aneurysm) ([place])   ? BPH (benign prostatic hyperplasia)   ? Elevated lipids   ? Hypertension   ? Nocturia   ? ? ?Procedure(s): ?ABDOMINAL AORTIC ENDOVASCULAR STENT GRAFT ?ULTRASOUND GUIDANCE FOR VASCULAR ACCESS ?PATCH ANGIOPLASTY USING XENOSURE BIOLOGIC PATCH (1cmx6cm) ?RIGHT ENDARTERECTOMY FEMORAL ?BILATERAL FEMORAL ARTERY THROMBECTOMY ?LEFT ENDARTERECTOMY POPLITEAL WITH ANGIOPLASTY USING XENOSURE BIOPATCH (1cmX6cm) ? ?Discharged Condition: good ? ?HPI: 81 year old male with asymptomatic AAA with what appears to be greater than 5.5 cm aneurysm by my measurement today.  Plan is for EVAR 12/26/21. ? ? ?Hospital Course:  ?Allen Arias is a 81 y.o. male is S/P  ?Procedure(s): ?ABDOMINAL AORTIC ENDOVASCULAR STENT GRAFT ?ULTRASOUND GUIDANCE FOR VASCULAR ACCESS ?PATCH ANGIOPLASTY USING XENOSURE BIOLOGIC PATCH (1cmx6cm) ?RIGHT ENDARTERECTOMY FEMORAL ?BILATERAL FEMORAL ARTERY THROMBECTOMY ?LEFT ENDARTERECTOMY POPLITEAL WITH ANGIOPLASTY USING XENOSURE BIOPATCH (1cmX6cm) ?Post op he developed loss of function of B LE.  Concerns for anterior spinal cord ischemia lead to placement of with elevated INR a spinal drain was not placed for safety reasons.  FFP and PLt transfusion were ordered. He has no evidence of spinal cord ischemia or  stroke on MRI.   Neurology and neurosurgery teams have evaluated the patient. ? Plan CT angiogram with lower extremity runoff to assess for RP hematoma, occult pseudoaneurysm, etc. Transfuse 2u PRBC STAT.  despite CTA finding of left calf hematoma, on exam left calf is soft to palpation.  No indication for exploration and evacuation at this time.  Leg weakness: MR demonstrating cord  thoracic  infarct; continue work with therapy.   His legs are well perfused with strong signals bilaterally.  ? He was discharged to CIR for continued therapy/rehab.  ? ? ?Significant Diagnostic Studies: ?CBC ?Lab Results  ?Component Value Date  ? WBC 7.7 01/03/2022  ? HGB 7.0 (L) 01/03/2022  ? HCT 21.2 (L) 01/03/2022  ? MCV 96.4 01/03/2022  ? PLT 132 (L) 01/03/2022  ? ? ?BMET ?   ?Component Value Date/Time  ? NA 135 01/02/2022 0537  ? K 4.6 01/02/2022 0537  ? CL 106 01/02/2022 0537  ? CO2 26 01/02/2022 0537  ? GLUCOSE 95 01/02/2022 0537  ? BUN 33 (H) 01/02/2022 0537  ? CREATININE 0.82 01/02/2022 0537  ? CALCIUM 8.1 (L) 01/02/2022 0537  ? GFRNONAA >60 01/02/2022 0537  ? GFRAA >60 02/04/2020 0529  ? ?COAG ?Lab Results  ?Component Value Date  ? INR 1.4 (H) 12/27/2021  ? INR 1.8 (H) 12/26/2021  ? INR 2.1 (H) 12/26/2021  ? ? ? ?Disposition:  ?Discharge to :Rehab ?Discharge Instructions   ? ? Call MD for:  redness, tenderness, or signs of infection (pain, swelling, bleeding, redness, odor or green/yellow discharge around incision site)   Complete by: As directed ?  ? Call MD for:  severe or increased pain, loss or decreased feeling  in affected limb(s)   Complete by: As directed ?  ? Call  MD for:  temperature >100.5   Complete by: As directed ?  ? Resume previous diet   Complete by: As directed ?  ? ?  ? ?Allergies as of 12/31/2021   ?No Active Allergies ?  ? ?  ?Medication List  ?  ? ?TAKE these medications   ? ?amLODipine 5 MG tablet ?Commonly known as: NORVASC ?Take 5 mg by mouth daily. ?  ?Aspirin Buf(CaCarb-MgCarb-MgO) 81 MG Tabs ?Take  81 mg by mouth as needed for pain. ?  ?dutasteride 0.5 MG capsule ?Commonly known as: AVODART ?Take 0.5 mg by mouth daily. ?  ?enalapril 10 MG tablet ?Commonly known as: VASOTEC ?Take 20 mg by mouth daily. ?  ?Melatonin 10 MG Caps ?Take 10 mg by mouth at bedtime. ?  ?multivitamin with minerals Tabs tablet ?Take 1 tablet by mouth daily. ?  ?simvastatin 20 MG tablet ?Commonly known as: ZOCOR ?Take 20 mg by mouth at bedtime. ?  ?tamsulosin 0.4 MG Caps capsule ?Commonly known as: FLOMAX ?Take 0.8 mg by mouth daily. ?  ?traZODone 50 MG tablet ?Commonly known as: DESYREL ?Take 50 mg by mouth at bedtime as needed for sleep. ?  ?VITAMIN C PO ?Take 1 capsule by mouth daily. ?  ?VITAMIN D PO ?Take 1 capsule by mouth daily. ?  ? ?  ? ?Verbal and written Discharge instructions given to the patient. Wound care per Discharge AVS ? ? ?Signed: ?Allen Arias ?01/03/2022, 9:05 AM ?- For [facility] Registry use --- ?Instructions: Press F2 to tab through selections.  Delete question if not applicable.  ? ?Post-op:  ?Time to Extubation: [ x] In OR, [ ]  < 12 hrs, [ ]  12-24 hrs, [ ]  >=24 hrs ?Vasopressors Req. Post-op: No ?MI: [ ]  No, [ ]  Troponin only, [ ]  EKG or Clinical ?New Arrhythmia: No ?CHF: No ?ICU Stay: 4 days ?Transfusion: Yes  If yes, 7 units given ? ?Complications: ?Resp failure: [ x] none, [ ]  Pneumonia, [ ]  Ventilator ?Chg in renal function: [ x] none, [ ]  Inc. Cr > 0.5, [ ]  Temp. Dialysis, [ ]  Permanent dialysis ?Leg ischemia: [ ]  No, [ ] x Yes, no Surgery needed, [ x] Yes, Surgery needed, [ ]  Amputation ?Bowel ischemia: [x ] No, [ ]  Medical Rx, [ ]  Surgical Rx ?Wound complication: [ x] No, [ ]  Superficial separation/infection, [ ]  Return to OR ?Return to OR: No  ?Return to OR for bleeding: No ?Stroke: [ ]  None, [ ]  Minor, [ x] Major thoracic spine infarct ? ?Discharge medications: ?Statin use:  Yes ?ASA use:  Yes ?Plavix use:  No  for medical reason not indicated ?Beta blocker use:  No  for medical reason not indicated ?

## 2022-01-03 NOTE — Progress Notes (Addendum)
PROGRESS NOTE   Subjective/Complaints: Patient seen at bedside today reports not eating much and also not drinking much either.  Family at bedside note answered questions that they had.  Patient denies any blood in urine or bleeding in stool.  Patient reports he is able to void after Foley was removed yesterday.  ROS: Limited by language.   Objective:   US Abdomen Limited RUQ (LIVER/GB)  Result Date: 01/02/2022 CLINICAL DATA:  Abnormal LFTs. EXAM: ULTRASOUND ABDOMEN LIMITED RIGHT UPPER QUADRANT COMPARISON:  CTA abdomen 12/28/2021 FINDINGS: Gallbladder: Multiple hyperechoic shadowing gallstones are seen, measuring up to 10 mm. Hypoechoic nonshadowing gallbladder sludge is also seen. No gallbladder wall thickening, pericholecystic fluid, or sonographic Murphy's sign. Common bile duct: Diameter: 6.7 mm.  No intrahepatic biliary ductal dilatation. Liver: No focal lesion identified. Within normal limits in parenchymal echogenicity. Portal vein is patent on color Doppler imaging with normal direction of blood flow towards the liver. Other: None. IMPRESSION:: IMPRESSION: Cholelithiasis and gallbladder sludge without sonographic evidence of acute cholecystitis. Electronically Signed   By: Neita Garnet M.D.   On: 01/02/2022 15:38   Recent Labs    01/01/22 0517 01/03/22 0536  WBC 5.7 7.7  HGB 9.1* 7.0*  HCT 26.6* 21.2*  PLT 73* 132*   Recent Labs    01/01/22 0517 01/02/22 0537  NA 137 135  K 3.7 4.6  CL 106 106  CO2 24 26  GLUCOSE 87 95  BUN 38* 33*  CREATININE 0.84 0.82  CALCIUM 8.0* 8.1*    Intake/Output Summary (Last 24 hours) at 01/03/2022 1805 Last data filed at 01/03/2022 1540 Gross per 24 hour  Intake 0 ml  Output 300 ml  Net -300 ml        Physical Exam:  Constitutional: Awake alert appropriate lying in bed.  No distress . Vital signs reviewed. HENT: Conjugate gaze, oropharynx moist.  Normocephalic.   Atraumatic. Eyes: EOMI. No discharge. Cardiovascular: RRR. No JVD. Respiratory: CTA Bilaterally. Normal effort.  No W/R/R good air movement movement GI: BS +. Non-distended. + BS, normoactive GU: Incontinence of urine, wearing brief. Neurological: Alert son in room interpreting. Musc: No edema. 1/5 in RLE, 2-/5 in LLE Skin: Bilateral groin incision, flat, non-tender, non-swollen, skin glue, RLE incision: flat, non-tender, non-swollen, skin glue Psychiatric appropriate-flattened affect  Vital Signs Blood pressure 119/64, pulse 87, temperature 98.7 F (37.1 C), temperature source Oral, resp. rate 16, height  (1.626 m), weight 50 kg, SpO2 97 %.    Assessment/Plan: 1. Functional deficits which require 3+ hours per day of interdisciplinary therapy in a comprehensive inpatient rehab setting. Physiatrist is providing close team supervision and 24 hour management of active medical problems listed below. Physiatrist and rehab team continue to assess barriers to discharge/monitor patient progress toward functional and medical goals  Care Tool:  Bathing    Body parts bathed by patient: Right arm, Left arm, Chest, Abdomen, Front perineal area, Face   Body parts bathed by helper: Buttocks, Right upper leg, Left upper leg, Right lower leg, Left lower leg     Bathing assist Assist Level: Moderate Assistance - Patient 50 - 74%     Upper Body Dressing/Undressing Upper body dressing  What is the patient wearing?: Pull over shirt    Upper body assist Assist Level: Moderate Assistance - Patient 50 - 74%    Lower Body Dressing/Undressing Lower body dressing      What is the patient wearing?: Pants, Incontinence brief     Lower body assist Assist for lower body dressing: Total Assistance - Patient < 25%     Toileting Toileting    Toileting assist Assist for toileting: Total Assistance - Patient < 25%     Transfers Chair/bed transfer  Transfers assist     Chair/bed  transfer assist level: Maximal Assistance - Patient 25 - 49% (slide board)     Locomotion Ambulation   Ambulation assist   Ambulation activity did not occur: Safety/medical concerns          Walk 10 feet activity   Assist  Walk 10 feet activity did not occur: Safety/medical concerns        Walk 50 feet activity   Assist Walk 50 feet with 2 turns activity did not occur: Safety/medical concerns         Walk 150 feet activity   Assist Walk 150 feet activity did not occur: Safety/medical concerns         Walk 10 feet on uneven surface  activity   Assist Walk 10 feet on uneven surfaces activity did not occur: Safety/medical concerns         Wheelchair     Assist Is the patient using a wheelchair?: Yes Type of Wheelchair: Manual    Wheelchair assist level: Dependent - Patient 0%      Wheelchair 50 feet with 2 turns activity    Assist    Wheelchair 50 feet with 2 turns activity did not occur: Safety/medical concerns       Wheelchair 150 feet activity     Assist  Wheelchair 150 feet activity did not occur: Safety/medical concerns       Blood pressure 119/64, pulse 87, temperature 98.7 F (37.1 C), temperature source Oral, resp. rate 16, height 5\' 4"  (1.626 m), weight 50 kg, SpO2 97 %.  Medical Problem List and Plan: 1. 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             - Patient may shower but incisions must be covered.              -ELOS/Goals: Min 10 to 14 days 2.  Antithrombotics: -DVT/anticoagulation: Mechanical: Sequential compression devices, below knee bilateral lower extremities - Check Dopplers in a.m. -01/01/2022 cannot be on Lovenox or Eliquis due to having low platelets with a risk of bleeding.  However if his platelets get above 100 K may consider starting.   -4/27 Vas [place]: Negative for DVT bilateral -4/29 Continue SCD's, while trending Plts -4/29 Plts today 132, up from 73 on 4/27, continue  to trend to    ensure stays above 100K  -antiplatelet therapy:N/A 3. Pain Management/Post-Op: Start Tylenol 650 mg 3 times daily.  Oxycodone as needed. 4. Mood: Team to provide a good support.  May need antidepressant-family and patient some disappointed and had multiple questions anxiety with regards to recovery and interventions potentially seeking second opinion etc.  Discussed with Dr. Johnsie Cancel specially on spinal cord pathology. -01/01/2022 we will start Celexa 20 mg daily for mood. -4/29 Mood appropriate today. - Antipsychotic agents: Not applicable 5. Neuropsych: This patient may be capable of making decisions on his own behalf. 6. Skin/Wound Care: Routine pressure relief measures.              --  Gerhadt's cream to scrotal breakdown    7. Fluids/Electrolytes/Nutrition: Routine in and outs with follow-up chemistries -4/29 continue to check Cmet 8.  History of BPH/urinary retention.  Continue to hold Hytrin and Flomax for now. - Monitor BP for any orthostatic changes/drops. 9.  Thrombocytopenia:?  Chronic monitor for signs of bleeding      -Has varied from 100 down to 44 down to 56 -4/29 Plts today 132, up from 73 on 4/27, continue to trend to    ensure stays above 100K 10.  ABLA: Monitor H/H for stability. - Improved from 5.7-10.  Recheck CBC in AM. -01/01/22 hemoglobin 9.1. -01/03/22 today hemoglobin has dropped to 7.0 there are no obvious signs of bleeding.  Patient does not report seeing visible blood in stool or urine.  New orders for stool guaiac x3 UA to rule out blood in urine repeat H&H and orthostatic blood pressure to monitor for symptoms. -4/29 U/A negative for blood in urine.     Latest Ref Rng & Units 01/03/2022    5:36 AM 01/01/2022    5:17 AM 12/29/2021   12:20 AM  CBC  WBC 4.0 - 10.5 K/uL 7.7   5.7   9.5    Hemoglobin 13.0 - 17.0 g/dL 7.0   9.1   28.4    Hematocrit 39.0 - 52.0 % 21.2   26.6   29.7    Platelets 150 - 400 K/uL 132   73   56      11.  Insomnia: Slept  well last night with current trazodone.  Continue as needed. 12.  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 - Check KUB for constipation/stool burden burden. - 01/01/22-start bowel program q. nightly with digital stimulation with suppository.  Educated patient and son on bowel program. 4/28-has not been eating but has had small bowel movement with the bowel program.   -4/29 has had stool today 13.  Neurogenic bladder: Had Foley due to urinary retention and low blood pressure -01/01/2022 plans to remove Foley in the morning on 428 and start Flomax 0.4 mg q. nightly because he was having low blood pressure up until now-we will monitor and stop if BP goes low again. -01/02/2022 Foley was removed around lunchtime. -01/03/2022 patient has voided twice since Foley removal however because patient is incontinent these were recorded as an occurrence instead of a volume.  Patient family and nurse said that he has been able to void without issue since Foley has been removed. 14.  Azotemia - 01/01/2022 BUN 38 was 21-we will give IVF's-50 cc/h and recheck in a.m. if not better tomorrow, will not remove Foley. 01/02/2022 BUN down to 33-we will continue IVF's for the rest of the day today and remove Foley this afternoon patient advised to drink more and recheck labs Monday morning 01/05/2022 -4/29 BUN trending down at 33 (4/28) from 38 on 4/27     Latest Ref Rng & Units 01/02/2022    5:37 AM 01/01/2022    5:17 AM 12/29/2021   12:20 AM  BMP  Glucose 70 - 99 mg/dL 95   87   132    BUN 8 - 23 mg/dL 33   38   21    Creatinine 0.61 - 1.24 mg/dL 4.40   1.02   7.25    Sodium 135 - 145 mmol/L 135   137   135    Potassium 3.5 - 5.1 mmol/L 4.6   3.7   3.9  Chloride 98 - 111 mmol/L 106   106   107    CO2 22 - 32 mmol/L 26   24   22     Calcium 8.9 - 10.3 mg/dL 8.1   8.0   7.8      15.  Cholelithiasis-  -4/28 no acute cholecystitis   LOS: 3 days A FACE TO FACE EVALUATION  WAS PERFORMED  Tressia Miners, FNP 01/03/2022, 6:05 PM

## 2022-01-03 NOTE — Progress Notes (Signed)
Dig stem x1 with suppository. Effective with medium sized brown stool ?

## 2022-01-04 DIAGNOSIS — G9511 Acute infarction of spinal cord (embolic) (nonembolic): Secondary | ICD-10-CM | POA: Diagnosis not present

## 2022-01-04 LAB — CBC WITH DIFFERENTIAL/PLATELET
Abs Immature Granulocytes: 0.2 10*3/uL — ABNORMAL HIGH (ref 0.00–0.07)
Basophils Absolute: 0 10*3/uL (ref 0.0–0.1)
Basophils Relative: 0 %
Eosinophils Absolute: 0.1 10*3/uL (ref 0.0–0.5)
Eosinophils Relative: 1 %
HCT: 22.8 % — ABNORMAL LOW (ref 39.0–52.0)
Hemoglobin: 7.4 g/dL — ABNORMAL LOW (ref 13.0–17.0)
Lymphocytes Relative: 8 %
Lymphs Abs: 0.6 10*3/uL — ABNORMAL LOW (ref 0.7–4.0)
MCH: 31.5 pg (ref 26.0–34.0)
MCHC: 32.5 g/dL (ref 30.0–36.0)
MCV: 97 fL (ref 80.0–100.0)
Metamyelocytes Relative: 1 %
Monocytes Absolute: 0.3 10*3/uL (ref 0.1–1.0)
Monocytes Relative: 4 %
Myelocytes: 1 %
Neutro Abs: 6.6 10*3/uL (ref 1.7–7.7)
Neutrophils Relative %: 85 %
Platelets: 154 10*3/uL (ref 150–400)
RBC: 2.35 MIL/uL — ABNORMAL LOW (ref 4.22–5.81)
RDW: 18.6 % — ABNORMAL HIGH (ref 11.5–15.5)
WBC: 7.8 10*3/uL (ref 4.0–10.5)
nRBC: 0 % (ref 0.0–0.2)
nRBC: 0 /100 WBC

## 2022-01-04 MED ORDER — MELATONIN 3 MG PO TABS
3.0000 mg | ORAL_TABLET | Freq: Every day | ORAL | Status: DC
  Administered 2022-01-04 – 2022-01-25 (×20): 3 mg via ORAL
  Filled 2022-01-04 (×20): qty 1

## 2022-01-04 NOTE — Progress Notes (Addendum)
Occupational Therapy Session Note ? ?Patient Details  ?Name: Jaquin Coy ?MRN: [id number] ?Date of Birth: April 01, 1941 ? ?Today's Date: 01/04/2022 ?OT Individual Time: [id number], [id number] ?OT Individual Time Calculation (min): 60 min ,60 min ? ? ?Short Term Goals: ?Week 1:  OT Short Term Goal 1 (Week 1): Pt will sit EOB dynamically with no more than MIN A for 5 min ?OT Short Term Goal 2 (Week 1): Pt will compeltes SB transfer to [facility] wiht MOD A ?OT Short Term Goal 3 (Week 1): Pt will maintain circle sitting wiht MIN A in prep for LB dressing for 5 min ?Week 2:    ? ?Skilled Therapeutic Interventions/Progress Updates:  ? 1 sesson,live video interpreter aided in verbalizing who I was at the time of treatment and the nature of the visit. The pt was able to understand the treatment protocol through demonstration and was able to carryout the remainder of the session. The patient was in bed upon arrival, pt transfer from supine to EOB for bathing at bed LOF with ModA for transfer. The patient was able to wash UB and ULE with ModA for task performance for UB and MaxA for LB secondary to  challenges with trunk strength, the pt incorporated the bed rail , but continued to have challenges with trunk strength so we positioned pillows behind him and occasionally had him sit unsupported, maintaining static sit using the bed rails for maintaining position, the pt required ModA for maintaining static sit during functional task performance.  The pt was able to donn his shirt with MinA and was MaxA for LB dressing. The pt returned to bed LOF, from EOB to supine with ModA for placement. The call light and bedside table were within reach and the alarm was activated. The pt had no pain response at the time of treatment.  ? ?2nd session, the pt family was present to convey what we would be working on this treatment session.  The pt was able to come from supine in bed to EOB with ModA, he was MaxA for donning his pants and was able to maintain  a sitting position secondary to pillows being place behind him for maintain position.  The patient was able to complete UB exercise using a 1lb dowel for shld flexion, horizontal abduction, and shld rotation 2 sets of 10 with rest breaks as needed, the pt required 1 rest break between exercises.  The pt was able to sit EOB and go into side lying ,incorporate his bilateral hands to position himself upright, he completed this activity 4x for both sides with vc's and MinA.  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  The patient returned to supine in bed using the bed rails for coming from EOB  to supine with ModA for managing his legs using the bed rails for his UB.  The pt's family was present, he had no c/o pain, his bedside table and call light were within reach, and his alarm was activated.  All additional needs were addressed.  ? ?Therapy Documentation ?Precautions:  ?Precautions ?Precautions: Fall ?Precaution Comments: foley ?Restrictions ?Weight Bearing Restrictions: No ? ?Therapy/Group: Individual Therapy ? ?Lavona Mound ?01/04/2022, 5:07 PM ?

## 2022-01-04 NOTE — Progress Notes (Signed)
Pt advised of bowel protocol and suppository due. Pt states is too early and would like to wait until 2000-2030.  ?

## 2022-01-04 NOTE — Progress Notes (Signed)
PROGRESS NOTE   Subjective/Complaints: Patient seen at bedside today reports not eating much, ~20 % of meals and also not drinking much either.  Voiding is okay after Foley removal on 4/28.  Patient reports not sleeping well while taking trazodone, can try melatonin instead.  ROS: Limited by language.   Objective:   US Abdomen Limited RUQ (LIVER/GB)  Result Date: 01/02/2022 CLINICAL DATA:  Abnormal LFTs. EXAM: ULTRASOUND ABDOMEN LIMITED RIGHT UPPER QUADRANT COMPARISON:  CTA abdomen 12/28/2021 FINDINGS: Gallbladder: Multiple hyperechoic shadowing gallstones are seen, measuring up to 10 mm. Hypoechoic nonshadowing gallbladder sludge is also seen. No gallbladder wall thickening, pericholecystic fluid, or sonographic Murphy's sign. Common bile duct: Diameter: 6.7 mm.  No intrahepatic biliary ductal dilatation. Liver: No focal lesion identified. Within normal limits in parenchymal echogenicity. Portal vein is patent on color Doppler imaging with normal direction of blood flow towards the liver. Other: None. IMPRESSION:: IMPRESSION: Cholelithiasis and gallbladder sludge without sonographic evidence of acute cholecystitis. Electronically Signed   By: Neita Garnet M.D.   On: 01/02/2022 15:38    Recent Labs    01/03/22 0536 01/04/22 0506  WBC 7.7 7.8  HGB 7.0* 7.4*  HCT 21.2* 22.8*  PLT 132* 154   Recent Labs    01/02/22 0537  NA 135  K 4.6  CL 106  CO2 26  GLUCOSE 95  BUN 33*  CREATININE 0.82  CALCIUM 8.1*    Intake/Output Summary (Last 24 hours) at 01/04/2022 1342 Last data filed at 01/04/2022 1300 Gross per 24 hour  Intake 480 ml  Output 300 ml  Net 180 ml        Physical Exam:  Constitutional: Awake alert appropriate lying in bed.  No distress . Vital signs reviewed. HENT: Conjugate gaze, oropharynx moist.  Normocephalic.  Atraumatic. Eyes: EOMI. No discharge. Cardiovascular: RRR. No JVD. Respiratory: CTA  Bilaterally. Normal effort.  No W/R/R good air movement movement GI: BS +. Non-distended. + BS, normoactive GU: Incontinence of urine, wearing brief. Neurological: Alert son in room interpreting. Musc: No edema. 1/5 in RLE, 2-/5 in LLE Skin: Bilateral groin incision, flat, non-tender, non-swollen, skin glue, RLE incision: flat, non-tender, non-swollen, skin glue Psychiatric appropriate-flattened affect  Vital Signs Blood pressure 127/73, pulse 85, temperature 98.1 F (36.7 C), resp. rate 16, height  (1.626 m), weight 50 kg, SpO2 100 %.    Assessment/Plan: 1. Functional deficits which require 3+ hours per day of interdisciplinary therapy in a comprehensive inpatient rehab setting. Physiatrist is providing close team supervision and 24 hour management of active medical problems listed below. Physiatrist and rehab team continue to assess barriers to discharge/monitor patient progress toward functional and medical goals  Care Tool:  Bathing    Body parts bathed by patient: Right arm, Left arm, Chest, Abdomen, Front perineal area, Face   Body parts bathed by helper: Buttocks, Right upper leg, Left upper leg, Right lower leg, Left lower leg     Bathing assist Assist Level: Moderate Assistance - Patient 50 - 74%     Upper Body Dressing/Undressing Upper body dressing   What is the patient wearing?: Pull over shirt    Upper body assist Assist Level: Moderate Assistance -  Patient 50 - 74%    Lower Body Dressing/Undressing Lower body dressing      What is the patient wearing?: Pants, Incontinence brief     Lower body assist Assist for lower body dressing: Total Assistance - Patient < 25%     Toileting Toileting    Toileting assist Assist for toileting: Total Assistance - Patient < 25%     Transfers Chair/bed transfer  Transfers assist     Chair/bed transfer assist level: Minimal Assistance - Patient > 75% (via SB)     Locomotion Ambulation   Ambulation  assist   Ambulation activity did not occur: Safety/medical concerns          Walk 10 feet activity   Assist  Walk 10 feet activity did not occur: Safety/medical concerns        Walk 50 feet activity   Assist Walk 50 feet with 2 turns activity did not occur: Safety/medical concerns         Walk 150 feet activity   Assist Walk 150 feet activity did not occur: Safety/medical concerns         Walk 10 feet on uneven surface  activity   Assist Walk 10 feet on uneven surfaces activity did not occur: Safety/medical concerns         Wheelchair     Assist Is the patient using a wheelchair?: Yes Type of Wheelchair: Manual    Wheelchair assist level: Dependent - Patient 0%      Wheelchair 50 feet with 2 turns activity    Assist    Wheelchair 50 feet with 2 turns activity did not occur: Safety/medical concerns       Wheelchair 150 feet activity     Assist  Wheelchair 150 feet activity did not occur: Safety/medical concerns       Blood pressure 127/73, pulse 85, temperature 98.1 F (36.7 C), resp. rate 16, height 5\' 4"  (1.626 m), weight 50 kg, SpO2 100 %.  Medical Problem List and Plan: 1. 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             - Patient may shower but incisions must be covered.              -ELOS/Goals: Min 10 to 14 days 2.  Antithrombotics: -DVT/anticoagulation: Mechanical: Sequential compression devices, below knee bilateral lower extremities - Check Dopplers in a.m. -01/01/2022 cannot be on Lovenox or Eliquis due to having low platelets with a risk of bleeding.  However if his platelets get above 100 K may consider starting.   -4/27 Vas [place]: Negative for DVT bilateral -4/29 Continue SCD's, while trending Plts -4/29 Plts today 132, up from 73 on 4/27, continue to trend to    ensure stays above 100K -4/30 Plts at 154 today, continue to trend with am labs  -antiplatelet therapy:N/A 3. Pain  Management/Post-Op: Start Tylenol 650 mg 3 times daily.  Oxycodone as needed. 4. Mood: Team to provide a good support.  May need antidepressant-family and patient some disappointed and had multiple questions anxiety with regards to recovery and interventions potentially seeking second opinion etc.  Discussed with Dr. Johnsie Cancel specially on spinal cord pathology. -01/01/2022 we will start Celexa 20 mg daily for mood. -4/29 Mood appropriate today. - Antipsychotic agents: Not applicable 5. Neuropsych: This patient may be capable of making decisions on his own behalf. 6. Skin/Wound Care: Routine pressure relief measures.              --Gerhadt's  cream to scrotal breakdown            -4/30-Continue use of cream as protective barrier.    7. Fluids/Electrolytes/Nutrition: Routine in and outs with follow-up chemistries -4/29 continue to check Cmet 8.  History of BPH/urinary retention.  Continue to hold Hytrin and Flomax for now. - Monitor BP for any orthostatic changes/drops. -4/30 Pending orthostatic vitals.  Pt voiding adequately s/p Foley removal. 9.  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  ABLA: Monitor H/H for stability. - Improved from 5.7-10.  Recheck CBC in AM. -01/01/22 hemoglobin 9.1. -01/03/22 today hemoglobin has dropped to 7.0 there are no obvious signs of bleeding.  Patient does not report seeing visible blood in stool or urine.  New orders for stool guaiac x3 UA to rule out blood in urine repeat H&H and orthostatic blood pressure to monitor for symptoms. -4/29 U/A negative for blood in urine. -4/30 Hgb up to 7.4 today from 7.0 yesterday, CBC in am to trend. Pending stool guaiac.      Latest Ref Rng & Units 01/04/2022    5:06 AM 01/03/2022    5:36 AM 01/01/2022    5:17 AM   CBC  WBC 4.0 - 10.5 K/uL 7.8   7.7   5.7    Hemoglobin 13.0 - 17.0 g/dL 7.4   7.0   9.1    Hematocrit 39.0 - 52.0 % 22.8   21.2   26.6    Platelets 150 - 400 K/uL 154   132   73      11.  Insomnia: Slept well last night with current trazodone.  Continue as needed.  -4/30 Pt reports that trazodone ineffective for sleep.  Trial of 3 mg melatonin to if this works better for the patient. 12.  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 - Check KUB for constipation/stool burden burden. - 01/01/22-start bowel program q. nightly with digital stimulation with suppository.  Educated patient and son on bowel program. 4/28-has not been eating but has had small bowel movement with the bowel program.   -4/29 has had stool today -4/30 No constipation reported, LBM 4/30 13.  Neurogenic bladder: Had Foley due to urinary retention and low blood pressure -01/01/2022 plans to remove Foley in the morning on 428 and start Flomax 0.4 mg q. nightly because he was having low blood pressure up until now-we will monitor and stop if BP goes low again. -01/02/2022 Foley was removed around lunchtime. -01/03/2022 patient has voided twice since Foley removal however because patient is incontinent these were recorded as an occurrence instead of a volume.  Patient family and nurse said that he has been able to void without issue since Foley has been removed. -4/30 Patient continues to void but is incontinent r/t neurogenic bladder 14.  Azotemia - 01/01/2022 BUN 38 was 21-we will give IVF's-50 cc/h and recheck in a.m. if not better tomorrow, will not remove Foley. 01/02/2022 BUN down to 33-we will continue IVF's for the rest of the day today and remove Foley this afternoon patient advised to drink more and recheck labs Monday morning 01/05/2022 -4/29 BUN trending down at 33 (4/28) from 38 on 4/27 -4/30 qMonday labs  to trend     Latest Ref Rng & Units 01/02/2022    5:37 AM 01/01/2022     5:17 AM 12/29/2021   12:20 AM  BMP  Glucose 70 - 99 mg/dL 95   87   454    BUN 8 - 23 mg/dL 33   38   21    Creatinine 0.61 - 1.24 mg/dL 0.98   1.19   1.47    Sodium 135 - 145 mmol/L 135   137   135    Potassium 3.5 - 5.1 mmol/L 4.6   3.7   3.9    Chloride 98 - 111 mmol/L 106   106   107    CO2 22 - 32 mmol/L 26   24   22     Calcium 8.9 - 10.3 mg/dL 8.1   8.0   7.8      15.  Cholelithiasis-  -4/28 no acute cholecystitis   LOS: 4 days A FACE TO FACE EVALUATION WAS PERFORMED  Tressia Miners, FNP 01/04/2022, 1:42 PM

## 2022-01-04 NOTE — Progress Notes (Signed)
Physical Therapy Session Note ? ?Patient Details  ?Name: Allen Arias ?MRN: [id number] ?Date of Birth: [birth year] ? ?Today's Date: 01/04/2022 ?PT Individual Time: 1000-1100 ?PT Individual Time Calculation (min): 60 min  ? ?Short Term Goals: ?Week 1:  PT Short Term Goal 1 (Week 1): Will be able to perform bed to chair transfer with slideboard minA ?PT Short Term Goal 2 (Week 1): Will be able to perform supine to sit with modA ?PT Short Term Goal 3 (Week 1): Will be able to maintain dynamic sitting balance during reaching activity with feet supported minA ?PT Short Term Goal 4 (Week 1): Pt will initiate w/c mobility ? ?Skilled Therapeutic Interventions/Progress Updates:  ?  Pt received supine in bed, agreeable to PT session. Pt reports some pain at surgical incision in R groin/hip region. Incision also noted to have serous drainage, nursing notified and able to exam incision during session. Pt found to have soiled brief and noted to have hematuria, nursing also notified. Pt is min A for rolling L/R with use of bedrails and is max A for brief change and pericare. Assisted pt with donning pants at bed level. Supine to sit with mod A via log-rolling technique from flat bed. Slide board transfer bed to w/c with min A with max verbal cueing needed for head/hips relationship during transfer, sequencing, and UE placement. Dependent transport via TIS chair to/from therapy gym. Session focus on standing with use of standing frame. Demonstrated use of equipment. Seated BP 147/71. Assisted pt to standing position with use of standing frame. Pt tolerates standing about 7 min total in standing frame with use of sling and UE for support. Pt has no complaints of OH in standing although standing BP does drop to 112/59. Attempt to have pt perform mini-squat in standing from sling to full, upright posture. Pt is able to perform but with heavy reliance on BUE, minimal LE and glute activation noted. Pt is able to perform lateral weight shifts  in standing with support from sling and decreased reliance on BUE. Pt returned to sitting in TIS chair and left semi-reclined in chair in room with needs in reach, quick release belt and chair alarm in place.  ? ?Utilized Conservation officer, nature during session, [place] interpreter Almira Coaster [phone]. ? ?Therapy Documentation ?Precautions:  ?Precautions ?Precautions: Fall ?Precaution Comments: foley ?Restrictions ?Weight Bearing Restrictions: No ? ? ? ? ?Therapy/Group: Individual Therapy ? ? ?Peter Congo, PT, DPT, CSRS ?01/04/2022, 12:17 PM  ?

## 2022-01-04 NOTE — Progress Notes (Signed)
Physical Therapy Session Note ? ?Patient Details  ?Name: Allen Arias ?MRN: [id number] ?Date of Birth: 1941/06/30 ? ?Today's Date: 01/04/2022 ?PT Individual Time: 3354-5625 ?PT Individual Time Calculation (min): 45 min  ? ?Short Term Goals: ?Week 1:  PT Short Term Goal 1 (Week 1): Will be able to perform bed to chair transfer with slideboard minA ?PT Short Term Goal 2 (Week 1): Will be able to perform supine to sit with modA ?PT Short Term Goal 3 (Week 1): Will be able to maintain dynamic sitting balance during reaching activity with feet supported minA ?PT Short Term Goal 4 (Week 1): Pt will initiate w/c mobility ? ?Skilled Therapeutic Interventions/Progress Updates:  ?Pt received sitting reclined in TIS chair and agreeable to therapy. [employer] tele-interpreter used this session for translation. Session focused on attempting STS using Stedy for LE weightbearing and strengthening. 2+ assist to stand via Stedy x 7 reps total; first 3 reps performed from w/c seat, last 4 reps performed from [place] seat. Verbal cuing provided to allow pt to pull with BUE's in order to stand, and promote max hip ext and erect posture when standing; pt demonstrated very high effort in order to maintain standing for ~ 5 sec at a time, improved hip/knee extension with each attempt, but reports that overall it continues to be difficult to put weight through BLE's. PT educated pt on his current dx and the resulting limitations causing his LE weakness and incontinence, pt communicated good verbal understanding. MinA transfer back to bed with slideboard and guarding anteriorly for safety, cuing for hand placement, minA bed mobility/rolling in order to change out of soiled briefs. Pt able to don clean shirt independently but with modA to support trunk in long-sitting on the bed. Pt reports that it is difficult to know when he needs to void. Pt left lying in bed with HOB elevated, safety alarm active, and all needs in reach at end of session. ? ?Therapy  Documentation ?Precautions:  ?Precautions ?Precautions: Fall ?Precaution Comments: foley ?Restrictions ?Weight Bearing Restrictions: No ? ? ?Therapy/Group: Individual Therapy ? ?Charmian Muff ?01/04/2022, 4:22 PM  ?

## 2022-01-05 DIAGNOSIS — G9511 Acute infarction of spinal cord (embolic) (nonembolic): Secondary | ICD-10-CM | POA: Diagnosis not present

## 2022-01-05 LAB — BASIC METABOLIC PANEL
Anion gap: 6 (ref 5–15)
BUN: 24 mg/dL — ABNORMAL HIGH (ref 8–23)
CO2: 26 mmol/L (ref 22–32)
Calcium: 8.3 mg/dL — ABNORMAL LOW (ref 8.9–10.3)
Chloride: 103 mmol/L (ref 98–111)
Creatinine, Ser: 0.75 mg/dL (ref 0.61–1.24)
GFR, Estimated: >60 mL/min (ref >60)
Glucose, Bld: 99 mg/dL (ref 70–99)
Potassium: 3.6 mmol/L (ref 3.5–5.1)
Sodium: 135 mmol/L (ref 135–145)

## 2022-01-05 LAB — CBC
HCT: 25.8 % — ABNORMAL LOW (ref 39.0–52.0)
Hemoglobin: 8.1 g/dL — ABNORMAL LOW (ref 13.0–17.0)
MCH: 31.2 pg (ref 26.0–34.0)
MCHC: 31.4 g/dL (ref 30.0–36.0)
MCV: 99.2 fL (ref 80.0–100.0)
Platelets: 206 10*3/uL (ref 150–400)
RBC: 2.6 MIL/uL — ABNORMAL LOW (ref 4.22–5.81)
RDW: 18.9 % — ABNORMAL HIGH (ref 11.5–15.5)
WBC: 7.2 10*3/uL (ref 4.0–10.5)
nRBC: 0 % (ref 0.0–0.2)

## 2022-01-05 MED ORDER — TAMSULOSIN HCL 0.4 MG PO CAPS
0.8000 mg | ORAL_CAPSULE | Freq: Every day | ORAL | Status: DC
  Administered 2022-01-05 – 2022-01-06 (×2): 0.8 mg via ORAL
  Filled 2022-01-05 (×2): qty 2

## 2022-01-05 MED ORDER — ENOXAPARIN SODIUM 40 MG/0.4ML IJ SOSY
40.0000 mg | PREFILLED_SYRINGE | INTRAMUSCULAR | Status: DC
  Administered 2022-01-05 – 2022-01-25 (×21): 40 mg via SUBCUTANEOUS
  Filled 2022-01-05 (×21): qty 0.4

## 2022-01-05 NOTE — Progress Notes (Signed)
Physical Therapy Session Note ? ?Patient Details  ?Name: Allen Arias ?MRN: [id number] ?Date of Birth: [birth year] ? ?Today's Date: 01/05/2022 ?PT Individual Time: 3716-9678 ?PT Individual Time Calculation (min): 54 min  ? ?Short Term Goals: ?Week 1:  PT Short Term Goal 1 (Week 1): Will be able to perform bed to chair transfer with slideboard minA ?PT Short Term Goal 2 (Week 1): Will be able to perform supine to sit with modA ?PT Short Term Goal 3 (Week 1): Will be able to maintain dynamic sitting balance during reaching activity with feet supported minA ?PT Short Term Goal 4 (Week 1): Pt will initiate w/c mobility ? ?Skilled Therapeutic Interventions/Progress Updates:  ?   ?[place] interpreter Jae Dire [phone] provides language interpretation for session. Pt received supine in bed and agrees to therapy. No complaint of pain. Supine to sit with modA for management of bilateral lower extremities and cues for sequencing. Pt able to sit at EOB without physical assistance. Pt performs stand pivot transfer to [facility] with modA/maxA and pt holding onto PT's belt for upper extremity support. Cues for body mechanics, initiation, and sequencing. WC transport to gym for time management. ? ?Pt performs sit to stand in parallel bars multiple times to work on sequencing of functional transfer, standing balance, and strengthening of bilateral lower extremities. PT blocks bilateral knees and pt completes with modA and verbal/tactile cues for body  mechanics, initiation, hip and trunk extension, and decreasing WB through arms to increase load through legs. Pt able to maintain standing with modA/minA at hips and knees for blocking. Pt remains standing ~1 minute each rep and completes x5. On final rep pt alternates releasing upper extremity support, requiring maxA for stability.  ? ?Stand pivot transfer to mat table with modA and pt holding onto PT's shoulders. Pt practices sitting balance with mirror for visual feedback. Pt alternates lifting arms  x10 reps each. Pt then places both hands on knees to provide narrower BOS and increase core engagement. Pt crosses arms over chest and performs x10 lateral sways progressing to x10 trunk rotations to either side for core strengthening and balance challenge. Following rest break, pt performs x15 alternating sitting-to-side leaning transitions and pushes back up to midline without physical assistance. ? ?Stand pivot from mat>WC>bed with modA/maxA and cues for body mechanics and sequencing. Sit to supine with modA management of legs Left supine with alarm intact and all needs within reach. ? ?Therapy Documentation ?Precautions:  ?Precautions ?Precautions: Fall ?Precaution Comments: foley ?Restrictions ?Weight Bearing Restrictions: No ? ? ? ?Therapy/Group: Individual Therapy ? ?Beau Fanny, PT, DPT ?01/05/2022, 4:15 PM  ?

## 2022-01-05 NOTE — Progress Notes (Signed)
Physical Therapy Session Note ? ?Patient Details  ?Name: Allen Arias ?MRN: [id number] ?Date of Birth: Mar 24, 1941 ? ?Today's Date: 01/05/2022 ?PT Individual Time: 8546-2703 ?PT Individual Time Calculation (min): 55 min  ? ?Short Term Goals: ?Week 1:  PT Short Term Goal 1 (Week 1): Will be able to perform bed to chair transfer with slideboard minA ?PT Short Term Goal 2 (Week 1): Will be able to perform supine to sit with modA ?PT Short Term Goal 3 (Week 1): Will be able to maintain dynamic sitting balance during reaching activity with feet supported minA ?PT Short Term Goal 4 (Week 1): Pt will initiate w/c mobility ? ?Skilled Therapeutic Interventions/Progress Updates:  ? Received pt semi-reclined in bed, pt agreeable to PT treatment, and denied any pain during session. [employer] interpreter [name] [phone] used during session. Session with emphasis on functional mobility/transfers, dressing, generalized strengthening and endurance, dynamic standing balance/coordination, and improved activity tolerance. Pt reported being soiled - rolled L/R with supervision and use of bedrails and removed soiled brief and performed peri-care dependently. Donned clean brief with max A via rolling and NT arrived - donned scrub pants in supine with +2 assist for time management. Pt transferred semi-reclined<>sitting EOB with HOB elevated and min A +2 (due to NT availability) - pt required assist for BLE management and trunk control. Pt required min A to scoot to EOB and placed 3in step under feet for improved positioning. Pt transferred bed<>TIS WC via slideboard with min A and doffed dirty gown and donned clean shirt with mod A. Pt sat in TIS WC at sink and brushed teeth and washed face with set up assist. Donned socks and shoes with max A and transferred sit<>stand in [place] with +2 assist to get onto stedy flaps - pt reported weakness and dizziness and requested to sit back in TIS WC. Semi-stand<>stand<>sitting with max/total A +2 and BP:  113/73. Pt reported feeling weaker and more tired today > yesterday but agreeable to remain sitting up in TIS WC. Concluded session with pt reclined in TIS WC, needs within reach, and seatbelt alarm on.   ? ?Therapy Documentation ?Precautions:  ?Precautions ?Precautions: Fall ?Precaution Comments: foley ?Restrictions ?Weight Bearing Restrictions: No ? ?Therapy/Group: Individual Therapy ?Alfonso Patten ?Raechel Chute PT, DPT  ?01/05/2022, 7:54 AM  ?

## 2022-01-05 NOTE — Progress Notes (Signed)
?                                                       PROGRESS NOTE ? ? ?Subjective/Complaints: ? ?Pt not hungry- didn't really eat much breakfast.  ?Feels he's getting better daily.  ?Still cannot tell when he needs to have BM- or void. Pt said he had BM last night, but was reticent to discuss in general.  ? ?Had delayed doing bowel program las tnight, but not clear from night nursing if had it or not- if he went.  ?Also not clear/ if had bladder scans.  ?Said "he's going to get things back, but doesn't know when".  ? ?BTD:VVOHYWV by language ?Objective: ?  ?No results found. ? ?Recent Labs  ?  01/04/22 ?3710 01/05/22 ?6269  ?WBC 7.8 7.2  ?HGB 7.4* 8.1*  ?HCT 22.8* 25.8*  ?PLT 154 206  ? ?Recent Labs  ?  01/05/22 ?0638  ?NA 135  ?K 3.6  ?CL 103  ?CO2 26  ?GLUCOSE 99  ?BUN 24*  ?CREATININE 0.75  ?CALCIUM 8.3*  ? ? ?Intake/Output Summary (Last 24 hours) at 01/05/2022 0846 ?Last data filed at 01/05/2022 0700 ?Gross per 24 hour  ?Intake 320 ml  ?Output --  ?Net 320 ml  ?  ? ?  ? ?Physical Exam: ? ? ?General: awake, alert, appropriate, sitting up in bed; interpretor online; NAD ?HENT: conjugate gaze; oropharynx dry ?CV: regular rate; no JVD ?Pulmonary: CTA B/L; no W/R/R- good air movement ?GI: soft, NT, ND, (+)BS- hypoactive ?Psychiatric: appropriate- flat affect ?Neurological: alert ?GU: wearing brief ?Neurological: Alert son in room interpreting. ?Musc: No edema. 1/5 in RLE, 2-/5 in LLE ?Skin: Bilateral groin incision, flat, non-tender, non-swollen, skin glue, RLE incision: flat, non-tender, non-swollen, skin glue ? ? ?Vital Signs ?Blood pressure (!) 149/75, pulse 85, temperature 98.4 ?F (36.9 ?C), temperature source Oral, resp. rate 18, height 5\' 4"  (1.626 m), weight 50 kg, SpO2 98 %. ? ? ? ?Assessment/Plan: ?1. Functional deficits which require 3+ hours per day of interdisciplinary therapy in a comprehensive inpatient rehab setting. ?Physiatrist is providing close team supervision and 24 hour management of active  medical problems listed below. ?Physiatrist and rehab team continue to assess barriers to discharge/monitor patient progress toward functional and medical goals ? ?Care Tool: ? ?Bathing ?   ?Body parts bathed by patient: Right arm, Left arm, Chest, Abdomen, Front perineal area, Face  ? Body parts bathed by helper: Buttocks, Right upper leg, Left upper leg, Right lower leg, Left lower leg ?  ?  ?Bathing assist Assist Level: Moderate Assistance - Patient 50 - 74% ?  ?  ?Upper Body Dressing/Undressing ?Upper body dressing   ?What is the patient wearing?: Pull over shirt ?   ?Upper body assist Assist Level: Moderate Assistance - Patient 50 - 74% ?   ?Lower Body Dressing/Undressing ?Lower body dressing ? ? ?   ?What is the patient wearing?: Pants, Incontinence brief ? ?  ? ?Lower body assist Assist for lower body dressing: Total Assistance - Patient < 25% ?   ? ?Toileting ?Toileting    ?Toileting assist Assist for toileting: Total Assistance - Patient < 25% ?  ?  ?Transfers ?Chair/bed transfer ? ?Transfers assist ?   ? ?Chair/bed transfer assist level: Minimal Assistance - Patient > 75% ?  ?  ?  Locomotion ?Ambulation ? ? ?Ambulation assist ? ? Ambulation activity did not occur: Safety/medical concerns ? ?  ?  ?   ? ?Walk 10 feet activity ? ? ?Assist ? Walk 10 feet activity did not occur: Safety/medical concerns ? ?  ?   ? ?Walk 50 feet activity ? ? ?Assist Walk 50 feet with 2 turns activity did not occur: Safety/medical concerns ? ?  ?   ? ? ?Walk 150 feet activity ? ? ?Assist Walk 150 feet activity did not occur: Safety/medical concerns ? ?  ?  ?  ? ?Walk 10 feet on uneven surface  ?activity ? ? ?Assist Walk 10 feet on uneven surfaces activity did not occur: Safety/medical concerns ? ? ?  ?   ? ?Wheelchair ? ? ? ? ?Assist Is the patient using a wheelchair?: Yes ?Type of Wheelchair: Manual ?  ? ?Wheelchair assist level: Dependent - Patient 0% ?   ? ? ?Wheelchair 50 feet with 2 turns activity ? ? ? ?Assist ? ?  ?Wheelchair  50 feet with 2 turns activity did not occur: Safety/medical concerns ? ? ?   ? ?Wheelchair 150 feet activity  ? ? ? ?Assist ? Wheelchair 150 feet activity did not occur: Safety/medical concerns ? ? ?   ? ?Blood pressure (!) 149/75, pulse 85, temperature 98.4 ?F (36.9 ?C), temperature source Oral, resp. rate 18, height 5\' 4"  (1.626 m), weight 50 kg, SpO2 98 %. ? ?Medical Problem List and Plan: ?1. 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 ?            - Patient may shower but incisions must be covered. ?             -ELOS/Goals: Min 10 to 14 days ? Con't CIR_ PT and OT- needs korean interpretor.  ?2.  Antithrombotics: ?-DVT/anticoagulation: Mechanical: Sequential compression devices, below knee bilateral lower extremities ?- Check Dopplers in a.m. ?-01/01/2022 cannot be on Lovenox or Eliquis due to having low platelets with a risk of bleeding.  However if his platelets get above 100 K may consider starting.   ?-4/27 Vas 5/27: Negative for DVT bilateral ?-4/29 Continue SCD's, while trending Plts ?-4/29 Plts today 132, up from 73 on 4/27, continue to trend to    ensure stays above 100K ?-4/30 Plts at 154 today, continue to trend with am labs ?5/1- plts 26- will start Lovenox since high risk for DVT and recheck labs Thursday.  ? -antiplatelet therapy:N/A ?3. Pain Management/Post-Op: Start Tylenol 650 mg 3 times daily.  Oxycodone as needed. ? 5/1- denies pain- con't regimen prn ?4. Mood: Team to provide a good support.  May need antidepressant-family and patient some disappointed and had multiple questions anxiety with regards to recovery and interventions potentially seeking second opinion etc.  Discussed with Dr. Wednesday specially on spinal cord pathology. ?-01/01/2022 we will start Celexa 20 mg daily for mood. ?-4/29 Mood appropriate today. ?- Antipsychotic agents: Not applicable ?5. Neuropsych: This patient may be capable of making decisions on his own behalf. ?6.  Skin/Wound Care: Routine pressure relief measures.  ?            --Gerhadt's cream to scrotal breakdown ?           -4/30-Continue use of cream as protective barrier. ?  ? ?7. Fluids/Electrolytes/Nutrition: Routine in and outs with follow-up chemistries ?-4/29 continue to check Cmet ?8.  History of BPH/urinary retention.  Continue to hold Hytrin and Flomax for now. ?-  Monitor BP for any orthostatic changes/drops. ?-4/30 Pending orthostatic vitals.  Pt voiding adequately s/p Foley removal. ?5/1- will increase Flomax to 0.8 mg q supper and see if retaining- I think urinary incontinence is due to overflow incontinence- needs to be cathed q6 hours- of might need ot change to q4 hours of need be. .  ?9.  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     -Has varied from 100 down to 44 down to 56 ?-4/29 Plts today 132, up from 73 on 4/27, continue to trend to    ensure stays above 100K ?-4/30 Plts at 154 today, continue to trend with am labs ?5/1- Plts 206- start Lovenox ?10.  ABLA: Monitor H/H for stability. ?- Improved from 5.7-10.  Recheck CBC in AM. ?-01/01/22 hemoglobin 9.1. ?-01/03/22 today hemoglobin has dropped to 7.0 there are no obvious signs of bleeding.  Patient does not report seeing visible blood in stool or urine.  New orders for stool guaiac x3 UA to rule out blood in urine repeat H&H and orthostatic blood pressure to monitor for symptoms. ?-4/29 U/A negative for blood in urine. ?-4/30 Hgb up to 7.4 today from 7.0 yesterday, CBC in am to trend. Pending stool guaiac.  ? 5/1- Hb 8.1 so don't know why it dropped so much- con't to monitor ? ?  Latest Ref Rng & Units 01/05/2022  ?  6:38 AM 01/04/2022  ?  5:06 AM 01/03/2022  ?  5:36 AM  ?CBC  ?WBC 4.0 - 10.5 K/uL 7.2   7.8   7.7    ?Hemoglobin 13.0 - 17.0 g/dL 8.1   7.4   7.0    ?Hematocrit 39.0 - 52.0 % 25.8   22.8   21.2    ?Platelets 150 - 400 K/uL 206   154   132    ?  ?11.  Insomnia: Slept well last night with current trazodone.  Continue as  needed. ? -4/30 Pt reports that trazodone ineffective for sleep.  Trial of 3 mg melatonin to if this works better for the patient. ?12.  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

## 2022-01-05 NOTE — Progress Notes (Signed)
Bowel program initiated by day shift and completed by night shift. ?Patient had medium sized liquid bowel movement. Patient also had episode of bladder incontinence. PVR was 99. ?

## 2022-01-05 NOTE — Progress Notes (Signed)
Patient ID: Allen Arias, male   DOB: 10/08/1940, 81 y.o.   MRN: [id number] ? ?Pt being followed by [facility].  ? ?Cecile Sheerer, MSW, LCSWA ?Office: [phone] ?Cell: [phone] ?Fax: [phone]  ?

## 2022-01-05 NOTE — Progress Notes (Signed)
Physical Therapy Session Note ? ?Patient Details  ?Name: Allen Arias ?MRN: [id number] ?Date of Birth: October 19, 1940 ? ?Today's Date: 01/05/2022 ?PT Individual Time: 1000-1100 ?PT Individual Time Calculation (min): 60 min  ? ?Short Term Goals: ?Week 1:  PT Short Term Goal 1 (Week 1): Will be able to perform bed to chair transfer with slideboard minA ?PT Short Term Goal 2 (Week 1): Will be able to perform supine to sit with modA ?PT Short Term Goal 3 (Week 1): Will be able to maintain dynamic sitting balance during reaching activity with feet supported minA ?PT Short Term Goal 4 (Week 1): Pt will initiate w/c mobility ? ? ?Skilled Therapeutic Interventions/Progress Updates:  ?[employer] tele-interpreter used this session (#30007). Pt received sitting in TIS chair; reports feeling very fatigued from strenuous activity yesterday. PT explained that this was expected d/t performing high-effort standing exercise. Pt was still agreeable to therapy; session focused on seated dynamic balance activities. Reports minimal pain, premedicated (not rated). Pt transported to therapy gym in TIS chair. Chair > mat-table transfer via slideboard with minA-modA this session, but max verbal cuing attempting to explain head-hips relationship and leaning in the opposite direction to unweight hips; pt states transfer felt more difficult d/t fatigue today. PT educated pt on trying to lift his hips up/off the board with each scoot, rather than trying to slide his hips directly across the board. Will continue to reinforce this with transfers. Seated dynamic balance initiated with feet supported and 1UE support on mat, PT providing minA to pt's back to maintain balance. Activity included reaching with one UE at a time to high-five SPT; reported 9/10 difficulty with this d/t being pre-fatigued from strenuous activity yesterday and this AM in previous therapy session. Required 3-4 min seated rest break with back supported against physioball and PT between  trials. Seated reaching/balance initiated with feet supported and 1UE support on mat, reaching to move cones from one table to another. MinA to provide support to pt's back and anteriorly at shoulder while verbal cuing provided to encourage anterior lean forward to reach outside BOS. Pt demonstrates mild sway laterally with significant hesitancy to lean forward d/t fear of falling.  ? ?PT obtained an ultralite TIS w/c this session to allow pt to initiate w/c mobility. Slideboard transfer from mat > ultralite w/c with modA, SPT blocking at the knees and providing truncal guarding, verbal cuing to encourage anterior lean to help unweight hips. Pt demonstrated momentary LOB anteriorly towards SPT, modA to recover balance. Pt able to scoot himself back into the w/c for optimal positioning with verbal cuing to lean forward and push off from bilat armrests, CGA for safety. Pt initiated w/c mobility x33ft from therapy gym > room. Pt left sitting in ultralight w/c (per pt preference) with safety alarm active and all needs within reach. ? ?Therapy Documentation ?Precautions:  ?Precautions ?Precautions: Fall ?Precaution Comments: foley ?Restrictions ?Weight Bearing Restrictions: No ? ?Therapy/Group: Individual Therapy ? ?Charmian Muff ?01/05/2022, 12:19 PM  ?

## 2022-01-06 MED ORDER — BISACODYL 10 MG RE SUPP
10.0000 mg | RECTAL | Status: DC
  Administered 2022-01-07: 10 mg via RECTAL
  Filled 2022-01-06: qty 1

## 2022-01-06 NOTE — Progress Notes (Addendum)
?                                                       PROGRESS NOTE ? ? ?Subjective/Complaints: ? ?Pt reports ate well this AM- doesn't eat much per pt and so only has BM's at home q2-4 days.  ?Also asking why not on BP meds ?Also concerned about LE swelling B/L esp in thighs.  ? ? ?RUE:[id number] by language ?Objective: ?  ?No results found. ? ?Recent Labs  ?  01/04/22 ?1914 01/05/22 ?7829  ?WBC 7.8 7.2  ?HGB 7.4* 8.1*  ?HCT 22.8* 25.8*  ?PLT 154 206  ? ?Recent Labs  ?  01/05/22 ?0638  ?NA 135  ?K 3.6  ?CL 103  ?CO2 26  ?GLUCOSE 99  ?BUN 24*  ?CREATININE 0.75  ?CALCIUM 8.3*  ? ? ?Intake/Output Summary (Last 24 hours) at 01/06/2022 0926 ?Last data filed at 01/06/2022 0457 ?Gross per 24 hour  ?Intake 240 ml  ?Output 199 ml  ?Net 41 ml  ?  ? ?  ? ?Physical Exam: ? ? ? ?General: awake, alert, appropriate, laying in bed; NAD ?HENT: conjugate gaze; oropharynx dry ?CV: regular rate; no JVD ?Pulmonary: CTA B/L; no W/R/R- good air movement ?GI: soft, NT, ND, (+)BS- slightly hyperactive ?Psychiatric: appropriate ?Neurological: alert ?GU: wearing brief- dry ?Neurological: Alert son in room interpreting. ?Musc:  1/5 in RLE, 2-/5 in LLE ?Skin: Bilateral groin incision, flat, non-tender, non-swollen, skin glue, RLE incision: flat, non-tender, non-swollen, skin glue ?Extremities: nonpitting 1-2+ LE edema up into thighs B/L  ? ?Vital Signs ?Blood pressure 133/63, pulse 83, temperature 98.8 ?F (37.1 ?C), temperature source Oral, resp. rate 16, height 5\' 4"  (1.626 m), weight 50 kg, SpO2 99 %. ? ? ? ?Assessment/Plan: ?1. Functional deficits which require 3+ hours per day of interdisciplinary therapy in a comprehensive inpatient rehab setting. ?Physiatrist is providing close team supervision and 24 hour management of active medical problems listed below. ?Physiatrist and rehab team continue to assess barriers to discharge/monitor patient progress toward functional and medical goals ? ?Care Tool: ? ?Bathing ?   ?Body parts bathed by  patient: Right arm, Left arm, Chest, Abdomen, Right upper leg, Left upper leg, Face  ? Body parts bathed by helper: Buttocks, Right lower leg, Left lower leg ?Body parts n/a: Front perineal area ?  ?Bathing assist Assist Level: Moderate Assistance - Patient 50 - 74% ?  ?  ?Upper Body Dressing/Undressing ?Upper body dressing   ?What is the patient wearing?: Pull over shirt ?   ?Upper body assist Assist Level: Contact Guard/Touching assist ?   ?Lower Body Dressing/Undressing ?Lower body dressing ? ? ?   ?What is the patient wearing?: Pants, Incontinence brief ? ?  ? ?Lower body assist Assist for lower body dressing: Total Assistance - Patient < 25% ?   ? ?Toileting ?Toileting    ?Toileting assist Assist for toileting: Total Assistance - Patient < 25% ?  ?  ?Transfers ?Chair/bed transfer ? ?Transfers assist ?   ? ?Chair/bed transfer assist level: Moderate Assistance - Patient 50 - 74% ?  ?  ?Locomotion ?Ambulation ? ? ?Ambulation assist ? ? Ambulation activity did not occur: Safety/medical concerns ? ?  ?  ?   ? ?Walk 10 feet activity ? ? ?Assist ? Walk 10 feet activity did not occur: Safety/medical  concerns ? ?  ?   ? ?Walk 50 feet activity ? ? ?Assist Walk 50 feet with 2 turns activity did not occur: Safety/medical concerns ? ?  ?   ? ? ?Walk 150 feet activity ? ? ?Assist Walk 150 feet activity did not occur: Safety/medical concerns ? ?  ?  ?  ? ?Walk 10 feet on uneven surface  ?activity ? ? ?Assist Walk 10 feet on uneven surfaces activity did not occur: Safety/medical concerns ? ? ?  ?   ? ?Wheelchair ? ? ? ? ?Assist Is the patient using a wheelchair?: Yes ?Type of Wheelchair: Manual ?  ? ?Wheelchair assist level: Supervision/Verbal cueing ?Max wheelchair distance: 8680ft  ? ? ?Wheelchair 50 feet with 2 turns activity ? ? ? ?Assist ? ?  ?  ? ? ?Assist Level: Supervision/Verbal cueing  ? ?Wheelchair 150 feet activity  ? ? ? ?Assist ?   ? ? ?Assist Level: Dependent - Patient 0%  ? ?Blood pressure 133/63, pulse 83,  temperature 98.8 ?F (37.1 ?C), temperature source Oral, resp. rate 16, height 5\' 4"  (1.626 m), weight 50 kg, SpO2 99 %. ? ?Medical Problem List and Plan: ?1. 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 ?            - Patient may shower but incisions must be covered. ?             -ELOS/Goals: Min 10 to 14 days ? Continue CIR- PT, OT  ? Team conference today to determine length of stay- also discussed with nursing at length about B/B issues ?2.  Antithrombotics: ?-DVT/anticoagulation: Mechanical: Sequential compression devices, below knee bilateral lower extremities ?- Check Dopplers in a.m. ?-01/01/2022 cannot be on Lovenox or Eliquis due to having low platelets with a risk of bleeding.  However if his platelets get above 100 K may consider starting.   ?-4/27 Vas US: Negative for DVT bilateral ?-4/29 Continue SCD's, while trending Plts ?-4/29 Plts today 132, up from 73 on 4/27, continue to trend to    ensure stays above 100K ?-4/30 Plts at 154 today, continue to trend with am labs ?5/1- plts 206- will start Lovenox since high risk for DVT and recheck labs Thursday.  ? -antiplatelet therapy:N/A ?3. Pain Management/Post-Op: Start Tylenol 650 mg 3 times daily.  Oxycodone as needed. ? 5/1- denies pain- con't regimen prn ?4. Mood: Team to provide a good support.  May need antidepressant-family and patient some disappointed and had multiple questions anxiety with regards to recovery and interventions potentially seeking second opinion etc.  Discussed with Dr. Johnsie CancelBourne specially on spinal cord pathology. ?-01/01/2022 we will start Celexa 20 mg daily for mood. ?-4/29 Mood appropriate today. ?- Antipsychotic agents: Not applicable ?5. Neuropsych: This patient may be capable of making decisions on his own behalf. ?6. Skin/Wound Care: Routine pressure relief measures.  ?            --Gerhadt's cream to scrotal breakdown ?           -4/30-Continue use of cream as protective  barrier. ?  ? ?7. Fluids/Electrolytes/Nutrition: Routine in and outs with follow-up chemistries ?8.  History of BPH/urinary retention.  Continue to hold Hytrin and Flomax for now. ?- Monitor BP for any orthostatic changes/drops. ?-4/30 Pending orthostatic vitals.  Pt voiding adequately s/p Foley removal. ?5/1- will increase Flomax to 0.8 mg q supper and see if retaining- I think urinary incontinence is due to overflow incontinence- needs to be  cathed q6 hours- of might need ot change to q4 hours if need be. [name] Kitchen  ?5/2- not getting cathed, but appears to be overflow- d/w charge and Publishing copy- and nurse- will change bladder scans to q4 hours and cath if volumes >350cc ?9.  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     -Has varied from 100 down to 44 down to 56 ?-4/29 Plts today 132, up from 73 on 4/27, continue to trend to    ensure stays above 100K ?-4/30 Plts at 154 today, continue to trend with am labs ?5/1- Plts 206- start Lovenox ?10.  ABLA: Monitor H/H for stability. ?- Improved from 5.7-10.  Recheck CBC in AM. ?-01/01/22 hemoglobin 9.1. ?-01/03/22 today hemoglobin has dropped to 7.0 there are no obvious signs of bleeding.  Patient does not report seeing visible blood in stool or urine.  New orders for stool guaiac x3 UA to rule out blood in urine repeat H&H and orthostatic blood pressure to monitor for symptoms. ?-4/29 U/A negative for blood in urine. ?-4/30 Hgb up to 7.4 today from 7.0 yesterday, CBC in am to trend. Pending stool guaiac.  ? 5/1- Hb 8.1 so don't know why it dropped so much- con't to monitor ? ?  Latest Ref Rng & Units 01/05/2022  ?  6:38 AM 01/04/2022  ?  5:06 AM 01/03/2022  ?  5:36 AM  ?CBC  ?WBC 4.0 - 10.5 K/uL 7.2   7.8   7.7    ?Hemoglobin 13.0 - 17.0 g/dL 8.1   7.4   7.0    ?Hematocrit 39.0 - 52.0 % 25.8   22.8   21.2    ?Platelets 150 - 400 K/uL 206   154   132    ?  ?11.  Insomnia: Slept well last night with current trazodone.  Continue as needed. ? -4/30 Pt reports that  trazodone ineffective for sleep.  Trial of 3 mg melatonin to if this works better for the patient. ?5/2- sleeping better ?12.  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 ?- Che

## 2022-01-06 NOTE — Patient Care Conference (Signed)
Inpatient RehabilitationTeam Conference and Plan of Care Update ?Date: 01/06/2022   Time: 11:00 AM  ? ? ?Patient Name: Allen Arias      ?Medical Record Number: [id number]  ?Date of Birth: [birth year] ?Sex: Male         ?Room/Bed: 4M07C/4M07C-01 ?Payor Info: Payor: MEDICARE / Plan: MEDICARE PART A AND B / Product Type: *No Product type* /   ? ?Admit Date/Time:  12/31/2021  3:57 PM ? 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 ?Hospital Problems: Principal Problem: ?  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 ? ? ?Expected Discharge Date: Expected Discharge Date: 01/29/22 (or 2 weeks for SNF placement.) ? ?Team Members Present: ?Physician leading conference: Dr. Genice Rouge ?Social Worker Present: Cecile Sheerer, LCSWA ?Nurse Present: Kennyth Arnold, RN ?PT Present: Peter Congo, PT ?OT Present: Ardis Rowan, Jaynee Eagles, OT ?PPS Coordinator present : Fae Pippin, SLP ? ?   Current Status/Progress Goal Weekly Team Focus  ?Bowel/Bladder ? ? incont of B/B LBM 5/1  recain cont.  assess q shift and prn, PVR q 4-6 hr, bowel program   ?Swallow/Nutrition/ Hydration ? ?           ?ADL's ? ? bathing-mod A; dressing-max A/tot A; toileting-dependent; SB tranfsers-min A/mod A  min A overall; mod a LB dressing and toileting  BADL retraining, functional tranfsers, educaiton, activity tolerance   ?Mobility ? ? modA bed mobility, min-modA SB transfer, dependent for standing with standing frame or Stedy, supervision w/c mobility  minA overall, mod I w/c mobility  transfers, sitting balance, w/c mobility, standing frame   ?Communication ? ?           ?Safety/Cognition/ Behavioral Observations ?           ?Pain ? ? no complains of pain  remain pain free  assess q shift and prn   ?Skin ? ? MASD to scrotum, groin incisions  no new breakdown  assess q shift and prn   ? ? ?Discharge Planning:  ?Pt is likely going to be SNF placement because dtr only able to provide intermittent support, and pt wife has  dementia in which he was primary caregiver. Will confirm d/c plan.   ?Team Discussion: ?Language is a barrier. Started Lovenox. Having urine overflow, modified orders to bladder scan every 4 hr prior to voiding, and PVR q 4 hr after voiding. DO NOT use purewick at all. Modified bowel program to every other day. Need to push fluids, BUN 24. Incontinent B/B, no reported pain. Incisions CDI, MASD, and scrotal breakdown. Need to apply barrier cream. Wife has dementia and family has to watch 24/7. Daughter states patient can't go to her house at discharge.  ? ?Patient on target to meet rehab goals: ?yes, min assist goals, mod I WC. Currently mod assist bed mobility, min/mod transfers, and dependent with standing. CGA sitting balance. ? ?*See Care Plan and progress notes for long and short-term goals.  ? ?Revisions to Treatment Plan:  ?Adjusting medications and orders. ?  ?Teaching Needs: ?Family education, medication management, skin/wound care, transfer/gait training, etc. ?  ?Current Barriers to Discharge: ?Decreased caregiver support, Home enviroment access/layout, Incontinence, Neurogenic bowel and bladder, Wound care, Lack of/limited family support, Weight bearing restrictions, and language. ? ?Possible Resolutions to Barriers: ?Family education ?Possible SNF placement ?Follow-up PT/OT ?Order recommended DME ?  ? ? Medical Summary ?Current Status: incomplete paraplegia- neurogenic bowel and bladder with overflow incontinence- changed bowel program to QOD- per pt- no pain-  has some MASD and scrotum breakdown; ? Barriers to Discharge: Behavior;Decreased family/caregiver support;Home enviroment access/layout;Medical stability;Incontinence;Neurogenic Bowel & Bladder;Medication compliance;Weight;Wound care ? Barriers to Discharge Comments: daughter cannot care for pt- wife has dementia - significant- ?Possible Resolutions to [employer] Focus: mod A PT- D for standing; goals min A; main barrier is home; and B/B; OT  goals min-mod A; cannot use purewick- - 01/29/22 if going home- if goes to SNF, needs 2 weeks- 5/11 ? ? ?Continued Need for Acute Rehabilitation Level of Care: The patient requires daily medical management by a physician with specialized training in physical medicine and rehabilitation for the following reasons: ?Direction of a multidisciplinary physical rehabilitation program to maximize functional independence : Yes ?Medical management of patient stability for increased activity during participation in an intensive rehabilitation regime.: Yes ?Analysis of laboratory values and/or radiology reports with any subsequent need for medication adjustment and/or medical intervention. : Yes ? ? ?I attest that I was present, lead the team conference, and concur with the assessment and plan of the team. ? ? ?Kennyth Arnold G ?01/06/2022, 2:43 PM  ? ? ? ? ? ? ?

## 2022-01-06 NOTE — Progress Notes (Signed)
Occupational Therapy Session Note ? ?Patient Details  ?Name: Allen Arias ?MRN: [id number] ?Date of Birth: 1941/08/09 ? ?Today's Date: 01/06/2022 ?OT Individual Time: [phone] ?OT Individual Time Calculation (min): 72 min  ? ? ?Short Term Goals: ?Week 1:  OT Short Term Goal 1 (Week 1): Pt will sit EOB dynamically with no more than MIN A for 5 min ?OT Short Term Goal 2 (Week 1): Pt will compeltes SB transfer to [facility] wiht MOD A ?OT Short Term Goal 3 (Week 1): Pt will maintain circle sitting wiht MIN A in prep for LB dressing for 5 min ? ?Skilled Therapeutic Interventions/Progress Updates:  ?  [employer] tele-interpreter used this session (MinKyung [phone]). Pt resting in bed upon arrival. OT intervention with focus on bed moblity, sitting balance, BADLs, and activity tolerance to increase independence with BADLs. LB bathing/dressing at bed level with pt rolling R/L with mod A to facilitate pericare and donning brief and pants. Supine>sit EOB with mod A. Sitting balance with CGA/min A and mod verbal cues to decrease dependence on BUE support and promote core strengthening. Pt completed UB bathing tasks with CGA seated EOB. Pt doffed pullover shirt with CGA. Min A for donning paper scrub shirt. Pt completed shaving with electric razor seated EOB with CGA for balance. Oral care seated EOB with CGA for balance. Pt returned to supine with mod A per nursing request. Pt remained in bed with all needs within reach. Bed alarm activated.  ? ?Therapy Documentation ?Precautions:  ?Precautions ?Precautions: Fall ?Precaution Comments: foley ?Restrictions ?Weight Bearing Restrictions: No ?  ?Pain: ? Pt denies pain this morning ? ? ?Therapy/Group: Individual Therapy ? ?Leroy Libman ?01/06/2022, 9:15 AM ?

## 2022-01-06 NOTE — Progress Notes (Signed)
Physical Therapy Session Note ? ?Patient Details  ?Name: Allen Arias ?MRN: [id number] ?Date of Birth: 22-May-1941 ? ?Today's Date: 01/06/2022 ?PT Individual Time: 1005-1100; 1300-1415 ?PT Therapy Time: 55 min; 75 min  ? ?Short Term Goals: ?Week 1:  PT Short Term Goal 1 (Week 1): Will be able to perform bed to chair transfer with slideboard minA ?PT Short Term Goal 2 (Week 1): Will be able to perform supine to sit with modA ?PT Short Term Goal 3 (Week 1): Will be able to maintain dynamic sitting balance during reaching activity with feet supported minA ?PT Short Term Goal 4 (Week 1): Pt will initiate w/c mobility ? ?Skilled Therapeutic Interventions/Progress Updates:  ?Session 1: ?[employer] tele-interpreter utilized this session. Pt received lying in bed in HOB elevated, agreeable to therapy. Pt reports minimal to no pain (not rated) that is controlled. Lying > sit modA for BLE management and verbal cuing for sequencing and to perform through R sidelying. CGA sitting balance at EOB, totalA for donning shoes while sitting EOB; CGA to maintain sitting balance at EOB with BUE supported, back and feet unsupported. Slideboard transfers performed min-modA w/c <> bed. ModA at start of session; demonstrates difficulty lifting hips off the board and tendency to lean posteriorly and attempt to slide his hips directly across board, max verbal cues provided to assume anterior trunk lean and towards the opposite direction of transfer. MinA SB transfer by the end of session, following part-practice of partial STS and lateral scoots across mat table. Part-practice with partial STS and lateral scoots across mat table in order to facilitate improved mechanics and efficiency for transfers. Partial STS performed x3 with modA; pt eventually able to clear hips off of table. Progressed to lateral scoots bidirectionally, x4 trials total with minA for close guarding in front of patient and max verbal cues for motor sequencing. Pt instructed to move  hips with 1 scoot at a time, using BUE's to bring legs over after each scoot; pt demonstrated successful attempts with this cuing pattern and states that scooting activity is a 6/10 difficulty, but more practice/reinforcement of the movement will be beneficial. Pt initiated w/c mobility 2x132ft with BUE propulsion and supervision to get to/from therapy gym. Pt requested to remain in TIS w/c after session. Pt left sitting in w/c with safety belt activated and all needs within reach. ? ? ?Session 2: ?[employer] tele-interpreter utilized this session. Pt received lying in bed and agreeable to therapy. Session focused on familiarizing pt with use of bilat leg straps to assist with LE management for therapeutic activities, as well as LE stretches with use of leg straps. PT and SPT donned leg straps with pt in reclined position. Recumbent > sitting modA and use of leg straps to drop LE's off the side of bed, required increased time to perform and some assistance maintaining balance and helping to clear feet off the bed; max verbal cuing and minA to scoot hips forward on EOB in order for feet to touch the ground. ModA SB transfer w/c <> bed, significant difficulty noted with transfers this afternoon compared to AM session, pt demonstrates reverting back to tendency to lean posteriorly and "slide" hips across the board with less trunk control, instead of performing anterior lean and considering head-hips relationship. Requires verbal cuing to get feet on the ground, trunk positioning, and hand placement during transfer. Although pt demonstrates verbal understanding of proper hand placement and appropriate direction of trunk lean during SB transfers, pt states it is partially a result  of fatigue making it difficult to execute. Pt initiated w/c mobility 2x141ft with BUE propulsion and supervision to get to/from therapy gym. SB transfer from w/c > mat table to instruct pt through supine mat stretches including: ?- hip/knee  flexion in supine (self-stretch with use of leg straps) x30sec bilat ?- "figure 4" hip flexor stretch in supine (therapist) x 30sec bilat ?- hamstring stretch in long-sitting (self stretch) x 30 sec, with minA to maintain seated balance ? ?Pt propelled himself back to room in w/c. SB transfer mod-maxA w/c > bed per nursing request, so that pt could receive bladder scan; pt demonstrated increased fatigue and decreased efficiency with transfer. Pt left lying in bed with safety alarm active and all needs in reach; notified nursing that patient was ready. ? ? ?Therapy Documentation ?Precautions:  ?Precautions ?Precautions: Fall ?Precaution Comments: foley ?Restrictions ?Weight Bearing Restrictions: No ? ? ?Therapy/Group: Individual Therapy ? ?Charmian Muff ?01/06/2022, 3:33 PM  ?

## 2022-01-06 NOTE — Progress Notes (Addendum)
Patient ID: Allen Arias, male   DOB: 04-Sep-1941, 81 y.o.   MRN: [id number] ? ?1420-SW spoke with pt dtr [name] to provide updates from team conference and requring Mid A level of care for him at discharge. Reports there is not anyone who is able to provide care. SW shared limited options available such as pt going home with [facility] therapies and an aide (atleast once per week) and applying fpr [facility]CS which can take up to 30-45 days to initiate as a nurse needs to approve hours recommended. SW reminded none of these options will provide 24/7 care. Will need to pursue SNF. SW shared pt d/c date will be 5/11. SW explained SNF placement process, and discussed her following up about their preference. SW shared pt will need to be in agreement with placement.  ? ?SW left SNF list ([email or web address] at front desk for pick up.  ? ?*SW returned call/left message for pt dtr [name] who wanted to discuss alternative options.  ? ?SW received phone call from pt SIL- [name] who expressed concerns about pt staying here longer than 5/11 since they report they are devastated this has happened to him, and were not aware he would be this way since he walked into the hospital. SW explained will have a physician to follow-up and discuss further.  ? ?Cecile Sheerer, MSW, LCSWA ?Office: [phone] ?Cell: [phone] ?Fax: [phone]  ?

## 2022-01-07 MED ORDER — ZOLPIDEM TARTRATE 5 MG PO TABS
5.0000 mg | ORAL_TABLET | Freq: Every day | ORAL | Status: DC
  Administered 2022-01-07 – 2022-01-25 (×19): 5 mg via ORAL
  Filled 2022-01-07 (×19): qty 1

## 2022-01-07 NOTE — Progress Notes (Signed)
Physical Therapy Session Note ? ?Patient Details  ?Name: Allen Arias ?MRN: [id number] ?Date of Birth: 1941-03-20 ? ?Today's Date: 01/07/2022 ?PT Individual Time: 9758-8325; 4982-6415 ?PT Individual Time Calculation (min): 70 min; 75 min  ? ?Short Term Goals: ?Week 1:  PT Short Term Goal 1 (Week 1): Will be able to perform bed to chair transfer with slideboard minA ?PT Short Term Goal 2 (Week 1): Will be able to perform supine to sit with modA ?PT Short Term Goal 3 (Week 1): Will be able to maintain dynamic sitting balance during reaching activity with feet supported minA ?PT Short Term Goal 4 (Week 1): Pt will initiate w/c mobility ? ? ?Skilled Therapeutic Interventions/Progress Updates:  ?Session 1: ?[employer] tele-interpreter Barnett Applebaum [phone] utilized throughout session. Pt received lying in bed and agreeable to therapy. Bed mobility performed lying > sitting with CGA and HOB slightly elevated, effective use of leg straps to manage BLE's and only requiring verbal cuing to get into R sidelying before pushing up to sit. SB transfer bed <> w/c with CGA; pt demonstrated proper hand placement and anterior weight shift/trunk lean to unweight hips without requiring verbal cuing. Has been requiring increased assist for SB transfers at end of session (minA) as a result of fatigue. ?Initiated dependent STS via standing frame, verbal cuing for hand placement on the table to help pull to standing, attempted the following in frame: ? ?- Weight shifts with minA and tactile cues on hips/glutes to guide movement d/t language barrier impacting pt's difficulty understanding motor task. Pt demonstrates compensatory movement, attempting to pull with BUE's in order to move hips. ? ?- TKEs with modA initiated to encourage isolated activation of LE's, multimodal cues provided with manual facilitation of knee extension. Trace strength in BLE's noted with pt exhibiting very high effort. ? ?Pt transported to alternate therapy gym in w/c for time  management to perform AAROM of glute/quadriceps bilat using the [employer]. Pt was unable to initiate alternate stepping, demonstrated high effort trying to push BLE's into the step simultaneously resulting in no movement. Verbal cues and demonstration were not successful for improving delivery of the task. Pt requires frequent sitting breaks between trials d/t endurance and strength deficits. Pt propelled w/c with BUEs 2x80 ft to get to/from therapy gym. SB transfer from w/c > bed. Pt left lying in bed with safety alarm on and all needs met. ? ?Session 2: ?[employer] tele-interpreter Claiborne Billings [phone] utilized for 1st half and Bonnita Nasuti #[id number] for 2nd half of session. Pt received lying in bed with HOB elevated and agreeable to therapy. When donning shoes in supine, PTA noted mild swelling in the L foot/ankle. Pt reports swelling was present post-sx and resolved itself, but this episode of swelling is recent; will continue to monitor. Pt found to be incontinent of urine with soiled brief; nursing notified to perform bladder scan. Lying > sitting through R sidelying CGA with effective use of leg straps to manage LE's. Performed SB transfer bed <> w/c minA, demonstrates tendency to keep 1UE on the w/c armrest and therefore req frequent verbal cues to move hands across surfaces as he transfers. Pt initiated w/c mobility 2x90f to get to/from therapy gym. Session focused on lateral scoots on edge of mat table and core strengthening to assist with transfers and dynamic sitting balance. Lateral scoots performed with minA in blocked part-practice to facilitate achieving enough anterior weight shift onto BLE's before attempting to move laterally. SPT provided education on how momentum with anterior weight shift is necessary and  beneficial to unweight the hips, making it easier to transfer; pt demonstrated verbal understanding and states that he can mentally process the steps involved, but experiences difficulty physically executing the  motor task. Modified sit-ups x4 against physioball without UE support, minA guarding anteriorly for safety. Pt demonstrated LOB with first attempt and required assist to recover but improved core control noted after. Overall, pt continues to demonstrate hesitancy leaning forward in sitting. Frequent seated rest breaks required during session d/t impaired endurance. Pt propelled w/c with BUEs back to room and performed SB transfer with improved efficiency back to bed with CGA. Pt left lying in bed with with safety alarm active and all needs met. ? ? ?Therapy Documentation ?Precautions:  ?Precautions ?Precautions: Fall ?Precaution Comments: foley ?Restrictions ?Weight Bearing Restrictions: No ? ? ? ?Therapy/Group: Individual Therapy ? ?Lanetta Inch ?01/07/2022, 3:07 PM  ?

## 2022-01-07 NOTE — Progress Notes (Signed)
Occupational Therapy Session Note ? ?Patient Details  ?Name: Allen Arias ?MRN: [id number] ?Date of Birth: 05-05-1941 ? ?Today's Date: 01/07/2022 ?OT Individual Time: 9211-9417 ?OT Individual Time Calculation (min): 75 min  ? ? ?Short Term Goals: ?Week 2:  OT Short Term Goal 1 (Week 2): Pt will compeltes SB transfer to [facility] wiht MOD A ?OT Short Term Goal 2 (Week 2): Pt will maintain circle sitting wiht MIN A in prep for LB dressing for 5 min ?OT Short Term Goal 3 (Week 2): Pt will complete UB dressing with min A seated EOB ?OT Short Term Goal 4 (Week 2): Pt will perform supine>sit EOB with min A in preparation for transfers ? ?Skilled Therapeutic Interventions/Progress Updates:  ?  PT resting in bed upon arrival. Pt incontinent of bladder (overflow) and tot A for hygiene/dependent for changing brief. Min A for rolling R/L in bed using bed rails. Tot A for donning pants at bed level. Attempted circle sitting but pt c/o increased discomfort 2/2 tightness. Pt will benefit from PROM/stretching. Supine>sit EOB with mod A. Sitting balance with min A EOB for grooming tasks (shaving and using mouthwash). Dependent for donning socks and shoes seated EOB. SB transfers with min A and min tactile cues for seqeucning. W/c mobility to gym and transfer to [facility] for dynamic sitting tasks reaching outside BOS (min A for balance.) Pt propelled w/c back to room and transferred to bed with min A. Max A for sit>supine. Pt remained in bed with all needs within reach. Bed alarm activated. ? ?Therapy Documentation ?Precautions:  ?Precautions ?Precautions: Fall ?Precaution Comments: foley ?Restrictions ?Weight Bearing Restrictions: No ?  ?Pain: ? Pt c/o BLE R>L tightness with PROM; repositoned and gentle stretching ? ? ?Therapy/Group: Individual Therapy ? ?[name] ?01/07/2022, 12:02 PM ?

## 2022-01-07 NOTE — Progress Notes (Signed)
Patient ID: Allen Arias, male   DOB: 11/01/1940, 81 y.o.   MRN: [id number] ? ?SW faxed authorized representative form to [facility] [phone]. ? ?Cecile Sheerer, MSW, LCSWA ?Office: [phone] ?Cell: [phone] ?Fax: [phone]  ?

## 2022-01-07 NOTE — Progress Notes (Signed)
Occupational Therapy Weekly Progress Note ? ?Patient Details  ?Name: Allen Arias ?MRN: [id number] ?Date of Birth: Mar 23, 1941 ? ?Beginning of progress report period: January 01, 2022 ?End of progress report period: Jan 07, 2022 ? ? ?Patient has met 1 of 3 short term goals.  Pt making slow progress with BADLs and functional transfers. Tot A/Dependent for LB dressing tasks. Bathing at bed level and seated EOB with mod A. UB dressing with mod A seated EOB. SB transfers with min A bed<>w/c and max A bed<>DABSC. Dynamic sitting balance EOB with mod A and min A/CGA for static sitting balance. Family has not been present for therapy sessions. ? ?Patient continues to demonstrate the following deficits: muscle weakness, decreased cardiorespiratoy endurance, impaired timing and sequencing, unbalanced muscle activation, decreased coordination, and decreased motor planning, and decreased sitting balance, decreased standing balance, decreased postural control, and decreased balance strategies and therefore will continue to benefit from skilled OT intervention to enhance overall performance with BADL and Reduce care partner burden. ? ?Patient progressing toward long term goals..  Continue plan of care. ? ?OT Short Term Goals ?Week 1:  OT Short Term Goal 1 (Week 1): Pt will sit EOB dynamically with no more than MIN A for 5 min ?OT Short Term Goal 1 - Progress (Week 1): Met ?OT Short Term Goal 2 (Week 1): Pt will compeltes SB transfer to [facility] wiht MOD A ?OT Short Term Goal 2 - Progress (Week 1): Progressing toward goal ?OT Short Term Goal 3 (Week 1): Pt will maintain circle sitting wiht MIN A in prep for LB dressing for 5 min ?OT Short Term Goal 3 - Progress (Week 1): Progressing toward goal ?Week 2:  OT Short Term Goal 1 (Week 2): Pt will compeltes SB transfer to [facility] wiht MOD A ?OT Short Term Goal 2 (Week 2): Pt will maintain circle sitting wiht MIN A in prep for LB dressing for 5 min ?OT Short Term Goal 3 (Week 2): Pt will complete UB  dressing with min A seated EOB ?OT Short Term Goal 4 (Week 2): Pt will perform supine>sit EOB with min A in preparation for transfers ? ? ? ?Leroy Libman ?01/07/2022, 6:58 AM  ?

## 2022-01-07 NOTE — Progress Notes (Signed)
?                                                       PROGRESS NOTE ? ? ?Subjective/Complaints: ? ?Pt asking about TEDs- hasn't received yet- d/w nursing that need to get for pt- don't see in room- and pt said hadn't received yet.  ? ?Also, poor sleep- cannot sleep well with current sleep meds- Melatonin- also has tried up to Trazodone 100 mg QHS.  ? ?Said ate well for breakfast- ate 40-50% of tray.  ?Wants IV out.  ? ? ? ?XLK:[id number] by language ?Objective: ?  ?No results found. ? ?Recent Labs  ?  01/05/22 ?0638  ?WBC 7.2  ?HGB 8.1*  ?HCT 25.8*  ?PLT 206  ? ?Recent Labs  ?  01/05/22 ?0638  ?NA 135  ?K 3.6  ?CL 103  ?CO2 26  ?GLUCOSE 99  ?BUN 24*  ?CREATININE 0.75  ?CALCIUM 8.3*  ? ? ?Intake/Output Summary (Last 24 hours) at 01/07/2022 0826 ?Last data filed at 01/06/2022 1834 ?Gross per 24 hour  ?Intake 320 ml  ?Output 250 ml  ?Net 70 ml  ?  ? ?  ? ?Physical Exam: ? ? ? ? ?General: awake, alert, appropriate, sitting up in bed; using tele interpretor to communicate; NAD ?HENT: conjugate gaze; oropharynx moist ?CV: regular rate; no JVD ?Pulmonary: CTA B/L; no W/R/R- good air movement ?GI: soft, NT, ND, (+)BS- hyperactive mildly.  ?Psychiatric: appropriate ?Neurological: Ox3 ?GU- wearing brief ?Neurological: Alert son in room interpreting. ?Musc:  1/5 in RLE, 2-/5 in LLE ?Skin: Bilateral groin incision, flat, non-tender, non-swollen, skin glue, RLE incision: flat, non-tender, non-swollen, skin glue ?Extremities: nonpitting 1-2+ LE edema up into thighs B/L  ? ?Vital Signs ?Blood pressure 131/70, pulse 77, temperature 98.7 ?F (37.1 ?C), temperature source Oral, resp. rate 18, height 5\' 4"  (1.626 m), weight 50 kg, SpO2 99 %. ? ? ? ?Assessment/Plan: ?1. Functional deficits which require 3+ hours per day of interdisciplinary therapy in a comprehensive inpatient rehab setting. ?Physiatrist is providing close team supervision and 24 hour management of active medical problems listed below. ?Physiatrist and rehab team continue  to assess barriers to discharge/monitor patient progress toward functional and medical goals ? ?Care Tool: ? ?Bathing ?   ?Body parts bathed by patient: Right arm, Left arm, Chest, Abdomen, Right upper leg, Left upper leg, Face  ? Body parts bathed by helper: Buttocks, Right lower leg, Left lower leg ?Body parts n/a: Front perineal area ?  ?Bathing assist Assist Level: Moderate Assistance - Patient 50 - 74% ?  ?  ?Upper Body Dressing/Undressing ?Upper body dressing   ?What is the patient wearing?: Pull over shirt ?   ?Upper body assist Assist Level: Contact Guard/Touching assist ?   ?Lower Body Dressing/Undressing ?Lower body dressing ? ? ?   ?What is the patient wearing?: Pants, Incontinence brief ? ?  ? ?Lower body assist Assist for lower body dressing: Total Assistance - Patient < 25% ?   ? ?Toileting ?Toileting    ?Toileting assist Assist for toileting: Total Assistance - Patient < 25% ?  ?  ?Transfers ?Chair/bed transfer ? ?Transfers assist ?   ? ?Chair/bed transfer assist level: Moderate Assistance - Patient 50 - 74% ?  ?  ?Locomotion ?Ambulation ? ? ?Ambulation assist ? ? Ambulation activity did not occur:  Safety/medical concerns ? ?  ?  ?   ? ?Walk 10 feet activity ? ? ?Assist ? Walk 10 feet activity did not occur: Safety/medical concerns ? ?  ?   ? ?Walk 50 feet activity ? ? ?Assist Walk 50 feet with 2 turns activity did not occur: Safety/medical concerns ? ?  ?   ? ? ?Walk 150 feet activity ? ? ?Assist Walk 150 feet activity did not occur: Safety/medical concerns ? ?  ?  ?  ? ?Walk 10 feet on uneven surface  ?activity ? ? ?Assist Walk 10 feet on uneven surfaces activity did not occur: Safety/medical concerns ? ? ?  ?   ? ?Wheelchair ? ? ? ? ?Assist Is the patient using a wheelchair?: Yes ?Type of Wheelchair: Manual ?  ? ?Wheelchair assist level: Supervision/Verbal cueing ?Max wheelchair distance: 8280ft  ? ? ?Wheelchair 50 feet with 2 turns activity ? ? ? ?Assist ? ?  ?  ? ? ?Assist Level: Supervision/Verbal  cueing  ? ?Wheelchair 150 feet activity  ? ? ? ?Assist ?   ? ? ?Assist Level: Dependent - Patient 0%  ? ?Blood pressure 131/70, pulse 77, temperature 98.7 ?F (37.1 ?C), temperature source Oral, resp. rate 18, height 5\' 4"  (1.626 m), weight 50 kg, SpO2 99 %. ? ?Medical Problem List and Plan: ?1. 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 ?            - Patient may shower but incisions must be covered. ?             if going home /dc 5/25; if going to SNF 5/11- family unhappy with d/c date- however cannot get 24/7 care at home ? Con't CIR- PT and OT- working on transfers and Adls mainly and w/c.  ? Team conference today to determine length of stay- also discussed with nursing at length about B/B issues ?2.  Antithrombotics: ?-DVT/anticoagulation: Mechanical: Sequential compression devices, below knee bilateral lower extremities ?- Check Dopplers in a.m. ?-01/01/2022 cannot be on Lovenox or Eliquis due to having low platelets with a risk of bleeding.  However if his platelets get above 100 K may consider starting.   ?-4/27 Vas US: Negative for DVT bilateral ?-4/29 Continue SCD's, while trending Plts ?-4/29 Plts today 132, up from 73 on 4/27, continue to trend to    ensure stays above 100K ?-4/30 Plts at 154 today, continue to trend with am labs ?5/1- plts 206- will start Lovenox since high risk for DVT and recheck labs Thursday.  ? -antiplatelet therapy:N/A ?3. Pain Management/Post-Op: Start Tylenol 650 mg 3 times daily.  Oxycodone as needed. ? 5/1- denies pain- con't regimen prn ?4. Mood: Team to provide a good support.  May need antidepressant-family and patient some disappointed and had multiple questions anxiety with regards to recovery and interventions potentially seeking second opinion etc.  Discussed with Dr. Johnsie CancelBourne specially on spinal cord pathology. ?-01/01/2022 we will start Celexa 20 mg daily for mood. ?-4/29 Mood appropriate today. ?- Antipsychotic agents:  Not applicable ?5. Neuropsych: This patient may be capable of making decisions on his own behalf. ?6. Skin/Wound Care: Routine pressure relief measures.  ?            --Gerhadt's cream to scrotal breakdown ?           -4/30-Continue use of cream as protective barrier. ?  ? ?7. Fluids/Electrolytes/Nutrition: Routine in and outs with follow-up chemistries ?8.  History of BPH/urinary retention.  Continue to hold Hytrin and Flomax for now. ?- Monitor BP for any orthostatic changes/drops. ?-4/30 Pending orthostatic vitals.  Pt voiding adequately s/p Foley removal. ?5/1- will increase Flomax to 0.8 mg q supper and see if retaining- I think urinary incontinence is due to overflow incontinence- needs to be cathed q6 hours- of might need ot change to q4 hours if need be. [name] Kitchen  ?5/2- not getting cathed, but appears to be overflow- d/w charge and Publishing copy- and nurse- will change bladder scans to q4 hours and cath if volumes >350cc ?5/3- looks like pt cathed once- and bladder scans q4 hours are less than 300cc, however is leaking- will stop Flomax since likely stopping him from retaining enough/having incontinence- and monitor ?9.  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     -Has varied from 100 down to 44 down to 56 ?-4/29 Plts today 132, up from 73 on 4/27, continue to trend to    ensure stays above 100K ?-4/30 Plts at 154 today, continue to trend with am labs ?5/1- Plts 206- start Lovenox ?10.  ABLA: Monitor H/H for stability. ?- Improved from 5.7-10.  Recheck CBC in AM. ?-01/01/22 hemoglobin 9.1. ?-01/03/22 today hemoglobin has dropped to 7.0 there are no obvious signs of bleeding.  Patient does not report seeing visible blood in stool or urine.  New orders for stool guaiac x3 UA to rule out blood in urine repeat H&H and orthostatic blood pressure to monitor for symptoms. ?-4/29 U/A negative for blood in urine. ?-4/30 Hgb up to 7.4 today from 7.0 yesterday, CBC in am to trend. Pending stool guaiac.   ? 5/1- Hb 8.1 so don't know why it dropped so much- con't to monitor ? ?  Latest Ref Rng & Units 01/05/2022  ?  6:38 AM 01/04/2022  ?  5:06 AM 01/03/2022  ?  5:36 AM  ?CBC  ?WBC 4.0 - 10.5 K/uL 7.2   7.8   7

## 2022-01-08 DIAGNOSIS — D62 Acute posthemorrhagic anemia: Secondary | ICD-10-CM

## 2022-01-08 DIAGNOSIS — D72829 Elevated white blood cell count, unspecified: Secondary | ICD-10-CM

## 2022-01-08 DIAGNOSIS — D696 Thrombocytopenia, unspecified: Secondary | ICD-10-CM

## 2022-01-08 DIAGNOSIS — R609 Edema, unspecified: Secondary | ICD-10-CM

## 2022-01-08 LAB — URINALYSIS, ROUTINE W REFLEX MICROSCOPIC
Glucose, UA: NEGATIVE mg/dL
Ketones, ur: NEGATIVE mg/dL
Nitrite: NEGATIVE
Protein, ur: 30 mg/dL — AB
Specific Gravity, Urine: 1.024 (ref 1.005–1.030)
WBC, UA: >50 WBC/hpf — ABNORMAL HIGH (ref 0–5)
pH: 5 (ref 5.0–8.0)

## 2022-01-08 LAB — CBC WITH DIFFERENTIAL/PLATELET
Abs Immature Granulocytes: 0.22 10*3/uL — ABNORMAL HIGH (ref 0.00–0.07)
Basophils Absolute: 0 10*3/uL (ref 0.0–0.1)
Basophils Relative: 0 %
Eosinophils Absolute: 0.1 10*3/uL (ref 0.0–0.5)
Eosinophils Relative: 0 %
HCT: 25.5 % — ABNORMAL LOW (ref 39.0–52.0)
Hemoglobin: 8.4 g/dL — ABNORMAL LOW (ref 13.0–17.0)
Immature Granulocytes: 1 %
Lymphocytes Relative: 3 %
Lymphs Abs: 0.5 10*3/uL — ABNORMAL LOW (ref 0.7–4.0)
MCH: 32.7 pg (ref 26.0–34.0)
MCHC: 32.9 g/dL (ref 30.0–36.0)
MCV: 99.2 fL (ref 80.0–100.0)
Monocytes Absolute: 0.5 10*3/uL (ref 0.1–1.0)
Monocytes Relative: 3 %
Neutro Abs: 14.3 10*3/uL — ABNORMAL HIGH (ref 1.7–7.7)
Neutrophils Relative %: 93 %
Platelets: 211 10*3/uL (ref 150–400)
RBC: 2.57 MIL/uL — ABNORMAL LOW (ref 4.22–5.81)
RDW: 19.9 % — ABNORMAL HIGH (ref 11.5–15.5)
WBC: 15.6 10*3/uL — ABNORMAL HIGH (ref 4.0–10.5)
nRBC: 0 % (ref 0.0–0.2)

## 2022-01-08 LAB — BASIC METABOLIC PANEL
Anion gap: 7 (ref 5–15)
BUN: 23 mg/dL (ref 8–23)
CO2: 24 mmol/L (ref 22–32)
Calcium: 8.3 mg/dL — ABNORMAL LOW (ref 8.9–10.3)
Chloride: 104 mmol/L (ref 98–111)
Creatinine, Ser: 0.73 mg/dL (ref 0.61–1.24)
GFR, Estimated: >60 mL/min (ref >60)
Glucose, Bld: 95 mg/dL (ref 70–99)
Potassium: 3.8 mmol/L (ref 3.5–5.1)
Sodium: 135 mmol/L (ref 135–145)

## 2022-01-08 MED ORDER — CEPHALEXIN 250 MG PO CAPS
250.0000 mg | ORAL_CAPSULE | Freq: Three times a day (TID) | ORAL | Status: AC
  Administered 2022-01-08 – 2022-01-15 (×22): 250 mg via ORAL
  Filled 2022-01-08 (×22): qty 1

## 2022-01-08 MED ORDER — ENSURE ENLIVE PO LIQD
237.0000 mL | Freq: Three times a day (TID) | ORAL | Status: DC
  Administered 2022-01-08 – 2022-01-25 (×36): 237 mL via ORAL

## 2022-01-08 MED ORDER — CALCIUM POLYCARBOPHIL 625 MG PO TABS
625.0000 mg | ORAL_TABLET | Freq: Two times a day (BID) | ORAL | Status: DC
  Administered 2022-01-08 – 2022-01-26 (×37): 625 mg via ORAL
  Filled 2022-01-08 (×37): qty 1

## 2022-01-08 MED ORDER — BISACODYL 10 MG RE SUPP
10.0000 mg | RECTAL | Status: DC
  Administered 2022-01-09 – 2022-01-23 (×7): 10 mg via RECTAL
  Filled 2022-01-08 (×10): qty 1

## 2022-01-08 NOTE — Progress Notes (Addendum)
Notified PA of UA Results. New orders placed. ?

## 2022-01-08 NOTE — Progress Notes (Signed)
?                                                       PROGRESS NOTE ? ? ?Subjective/Complaints: ? ?Reports no new complaints this am. Does not have TEDs yet. Reports he slept well last night.  ?Interpreter assisted today. ? ? ? ?ROS:No CP, SOB, HA ?Objective: ?  ?No results found. ? ?Recent Labs  ?  01/08/22 ?0522  ?WBC 15.6*  ?HGB 8.4*  ?HCT 25.5*  ?PLT 211  ? ? ?Recent Labs  ?  01/08/22 ?0522  ?NA 135  ?K 3.8  ?CL 104  ?CO2 24  ?GLUCOSE 95  ?BUN 23  ?CREATININE 0.73  ?CALCIUM 8.3*  ? ? ? ?Intake/Output Summary (Last 24 hours) at 01/08/2022 0753 ?Last data filed at 01/07/2022 1340 ?Gross per 24 hour  ?Intake 240 ml  ?Output --  ?Net 240 ml  ? ?  ? ?  ? ?Physical Exam: ? ? ? ? ?General: awake, alert, appropriate, sitting up in bed; using tele interpretor to communicate; NAD ?HENT: conjugate gaze; oropharynx moist ?CV: regular rate; no JVD ?Pulmonary: CTA B/L; no W/R/R- good air movement ?GI: soft, NT, ND, (+)BS- hyperactive mildly.  ?Psychiatric: appropriate ?Neurological: Ox3 ?GU- wearing brief ?Neurological: Alert, answers questions and follows commands ?Musc:  1/5 in RLE, 2-/5 in LLE ?Skin: Bilateral groin incision, flat, non-tender, non-swollen, skin glue, RLE incision: flat, non-tender, non-swollen, skin glue ?Extremities: nonpitting 1-2+ LE edema up into thighs B/L  ? ?Vital Signs ?Blood pressure 117/60, pulse 77, temperature (!) 97.5 ?F (36.4 ?C), resp. rate 18, height 5\' 4"  (1.626 m), weight 49.8 kg, SpO2 98 %. ? ? ? ?Assessment/Plan: ?1. Functional deficits which require 3+ hours per day of interdisciplinary therapy in a comprehensive inpatient rehab setting. ?Physiatrist is providing close team supervision and 24 hour management of active medical problems listed below. ?Physiatrist and rehab team continue to assess barriers to discharge/monitor patient progress toward functional and medical goals ? ?Care Tool: ? ?Bathing ?   ?Body parts bathed by patient: Right arm, Left arm, Chest, Abdomen, Right upper  leg, Left upper leg, Face  ? Body parts bathed by helper: Buttocks, Right lower leg, Left lower leg ?Body parts n/a: Front perineal area ?  ?Bathing assist Assist Level: Moderate Assistance - Patient 50 - 74% ?  ?  ?Upper Body Dressing/Undressing ?Upper body dressing   ?What is the patient wearing?: Pull over shirt ?   ?Upper body assist Assist Level: Contact Guard/Touching assist ?   ?Lower Body Dressing/Undressing ?Lower body dressing ? ? ?   ?What is the patient wearing?: Pants, Incontinence brief ? ?  ? ?Lower body assist Assist for lower body dressing: Total Assistance - Patient < 25% ?   ? ?Toileting ?Toileting    ?Toileting assist Assist for toileting: Total Assistance - Patient < 25% ?  ?  ?Transfers ?Chair/bed transfer ? ?Transfers assist ?   ? ?Chair/bed transfer assist level: Contact Guard/Touching assist (via slideboard) ?  ?  ?Locomotion ?Ambulation ? ? ?Ambulation assist ? ? Ambulation activity did not occur: Safety/medical concerns ? ?  ?  ?   ? ?Walk 10 feet activity ? ? ?Assist ? Walk 10 feet activity did not occur: Safety/medical concerns ? ?  ?   ? ?Walk 50 feet activity ? ? ?Assist  Walk 50 feet with 2 turns activity did not occur: Safety/medical concerns ? ?  ?   ? ? ?Walk 150 feet activity ? ? ?Assist Walk 150 feet activity did not occur: Safety/medical concerns ? ?  ?  ?  ? ?Walk 10 feet on uneven surface  ?activity ? ? ?Assist Walk 10 feet on uneven surfaces activity did not occur: Safety/medical concerns ? ? ?  ?   ? ?Wheelchair ? ? ? ? ?Assist Is the patient using a wheelchair?: Yes ?Type of Wheelchair: Manual ?  ? ?Wheelchair assist level: Supervision/Verbal cueing ?Max wheelchair distance: 39ft  ? ? ?Wheelchair 50 feet with 2 turns activity ? ? ? ?Assist ? ?  ?  ? ? ?Assist Level: Supervision/Verbal cueing  ? ?Wheelchair 150 feet activity  ? ? ? ?Assist ?   ? ? ?Assist Level: Dependent - Patient 0%  ? ?Blood pressure 117/60, pulse 77, temperature (!) 97.5 ?F (36.4 ?C), resp. rate 18,  height 5\' 4"  (1.626 m), weight 49.8 kg, SpO2 98 %. ? ?Medical Problem List and Plan: ?1. 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 ?            - Patient may shower but incisions must be covered. ?             if going home /dc 5/25; if going to SNF 5/11- family unhappy with d/c date- however cannot get 24/7 care at home ? Con't CIR- PT and OT- working on transfers and Adls mainly and w/c.  ? Team conference today to determine length of stay- also discussed with nursing at length about B/B issues ?2.  Antithrombotics: ?-DVT/anticoagulation: Mechanical: Sequential compression devices, below knee bilateral lower extremities ?- Check Dopplers in a.m. ?-01/01/2022 cannot be on Lovenox or Eliquis due to having low platelets with a risk of bleeding.  However if his platelets get above 100 K may consider starting.   ?-4/27 Vas 5/27: Negative for DVT bilateral ?-4/29 Continue SCD's, while trending Plts ?-4/29 Plts today 132, up from 73 on 4/27, continue to trend to    ensure stays above 100K ?-4/30 Plts at 154 today, continue to trend with am labs ?5/1- plts 206- will start Lovenox since high risk for DVT and recheck labs Thursday.  ?5/4 plts 211- continue to monitor ? -antiplatelet therapy:N/A ?3. Pain Management/Post-Op: Start Tylenol 650 mg 3 times daily.  Oxycodone as needed. ? 5/1- denies pain- con't regimen prn ?4. Mood: Team to provide a good support.  May need antidepressant-family and patient some disappointed and had multiple questions anxiety with regards to recovery and interventions potentially seeking second opinion etc.  Discussed with Dr. Wednesday specially on spinal cord pathology. ?-01/01/2022 we will start Celexa 20 mg daily for mood. ?-4/29 Mood appropriate today. ?- Antipsychotic agents: Not applicable ?5. Neuropsych: This patient may be capable of making decisions on his own behalf. ?6. Skin/Wound Care: Routine pressure relief measures.  ?             --Gerhadt's cream to scrotal breakdown ?           -4/30-Continue use of cream as protective barrier. ?  ? ?7. Fluids/Electrolytes/Nutrition: Routine in and outs with follow-up chemistries ?8.  History of BPH/urinary retention.  Continue to hold Hytrin and Flomax for now. ?- Monitor BP for any orthostatic changes/drops. ?-4/30 Pending orthostatic vitals.  Pt voiding adequately s/p Foley removal. ?5/1- will increase Flomax to 0.8 mg q supper and see  if retaining- I think urinary incontinence is due to overflow incontinence- needs to be cathed q6 hours- of might need ot change to q4 hours if need be. [name] Kitchen  ?5/2- not getting cathed, but appears to be overflow- d/w charge and Publishing copy- and nurse- will change bladder scans to q4 hours and cath if volumes >350cc ?5/3- looks like pt cathed once- and bladder scans q4 hours are less than 300cc, however is leaking- will stop Flomax since likely stopping him from retaining enough/having incontinence- and monitor ?9.  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     -Has varied from 100 down to 44 down to 56 ?-4/29 Plts today 132, up from 73 on 4/27, continue to trend to    ensure stays above 100K ?-4/30 Plts at 154 today, continue to trend with am labs ?5/1- Plts 206- start Lovenox ?10.  ABLA: Monitor H/H for stability. ?- Improved from 5.7-10.  Recheck CBC in AM. ?-01/01/22 hemoglobin 9.1. ?-01/03/22 today hemoglobin has dropped to 7.0 there are no obvious signs of bleeding.  Patient does not report seeing visible blood in stool or urine.  New orders for stool guaiac x3 UA to rule out blood in urine repeat H&H and orthostatic blood pressure to monitor for symptoms. ?-4/29 U/A negative for blood in urine. ?-4/30 Hgb up to 7.4 today from 7.0 yesterday, CBC in am to trend. Pending stool guaiac.  ? 5/1- Hb 8.1 so don't know why it dropped so much- con't to monitor ?5/4- HGB 8.4 today, appears to be stable, continue to monitor ?  ?11.  Insomnia: Slept well last  night with current trazodone.  Continue as needed. ? -4/30 Pt reports that trazodone ineffective for sleep.  Trial of 3 mg melatonin to if this works better for the patient. ?5/3- will change to Ambien 5 mg QHS- due

## 2022-01-08 NOTE — Progress Notes (Signed)
Occupational Therapy Session Note ? ?Patient Details  ?Name: Allen Arias ?MRN: [id number] ?Date of Birth: [birth year] ? ?Today's Date: 01/08/2022 ?OT Individual Time: 7408-1448 ?OT Individual Time Calculation (min): 70 min  ? ? ?Short Term Goals: ?Week 2:  OT Short Term Goal 1 (Week 2): Pt will compeltes SB transfer to [facility] wiht MOD A ?OT Short Term Goal 2 (Week 2): Pt will maintain circle sitting wiht MIN A in prep for LB dressing for 5 min ?OT Short Term Goal 3 (Week 2): Pt will complete UB dressing with min A seated EOB ?OT Short Term Goal 4 (Week 2): Pt will perform supine>sit EOB with min A in preparation for transfers ? ?Skilled Therapeutic Interventions/Progress Updates:  ?  [employer] interpreters Cindy [phone]) and Eunmi ([id number]). Pt resting in bed upon arrival. Pt declined changing shirts or washing up. Pt incontinent of bowel and dependent for hygiene and changing brief at bed level. Rolling R/L using bed rails with min A. Tot A for donning pants before sitting EOB. Supine>sit EOB with mod A for BLE mgmt. Pt able to perform sidelying>sit EOB with min A. Sitting balance with CGA. Pt prefers UE support when sitting EOB but is able to use BUE during functional tasks and maintain sitting balance with CGA. Pt completed oral care and shaving seated EOB. Pt engaged in reaching tasks to retrieve/replace electric shaver on table. Min A for dynamic sitting balance. Pt returned to bed to facilitate donning Faythe Dingwall (ordered from MD during rounds). Sit>supine with mod A. Pt able to bridge for repositioning with mod A. Pt remained in bed with all needs within reach. Bed alarm activated.  ? ?Therapy Documentation ?Precautions:  ?Precautions ?Precautions: Fall ?Precaution Comments: foley ?Restrictions ?Weight Bearing Restrictions: No ? ?Pain: ? Pt reports BLE/groin pain (4/10); meds admin during session ? ? ?Therapy/Group: Individual Therapy ? ?[name] ?01/08/2022, 9:28 AM ?

## 2022-01-08 NOTE — Progress Notes (Signed)
Digi-stem performed per PA. Patient had a smear of bowel movement. ?

## 2022-01-08 NOTE — Progress Notes (Signed)
Physical Therapy Weekly Progress Note ? ?Patient Details  ?Name: Allen Arias ?MRN: [id number] ?Date of Birth: 09/27/1940 ? ?Beginning of progress report period: January 01, 2022 ?End of progress report period: Jan 08, 2022 ? ?Today's Date: 01/08/2022 ? ?Patient has met 4 of 4 short term goals.  Pt performs bed > chair transfer via slideboard with CGA, tactile cuing for hand placement and verbal cues for anterior weight shift, may require minA for unweighting hips when he becomes fatigued. Can perform supine > sitting with CGA if utilizing leg straps, modA without straps for BLE management. Pt continues to require minA to maintain dynamic sitting balance during reaching activity for safety d/t hesitancy/fear of falling. Pt has initiated w/c mobility x 80-126f with supervision. ? ?Patient continues to demonstrate the following deficits muscle weakness and muscle paralysis and therefore will continue to benefit from skilled PT intervention to increase functional independence with mobility. ? ?Patient progressing toward long term goals..  Continue plan of care. ? ?PT Short Term Goals ?Week 1:  PT Short Term Goal 1 (Week 1): Will be able to perform bed to chair transfer with slideboard minA ?PT Short Term Goal 1 - Progress (Week 1): Met ?PT Short Term Goal 2 (Week 1): Will be able to perform supine to sit with modA ?PT Short Term Goal 2 - Progress (Week 1): Met ?PT Short Term Goal 3 (Week 1): Will be able to maintain dynamic sitting balance during reaching activity with feet supported minA ?PT Short Term Goal 3 - Progress (Week 1): Met ?PT Short Term Goal 4 (Week 1): Pt will initiate w/c mobility ?PT Short Term Goal 4 - Progress (Week 1): Met ?Week 2:  PT Short Term Goal 1 (Week 2): Will be able to maintain dynamic sitting balance with feet supported during reaching activity CGA ?PT Short Term Goal 2 (Week 2): Will be able to demonstrate bed/chair transfer CGA without requiring verbal cuing for hand placement ?PT Short Term Goal  3 (Week 2): Will be able to perform supine <> sit with CGA utilizing leg straps ? ? ? ?Therapy Documentation ?Precautions:  ?Precautions ?Precautions: Fall ?Precaution Comments: foley ?Restrictions ?Weight Bearing Restrictions: No ? ?Therapy/Group: Individual Therapy ? ?RLanetta Inch?01/08/2022, 4:28 PM  ?

## 2022-01-08 NOTE — Progress Notes (Signed)
Physical Therapy Session Note ? ?Patient Details  ?Name: Allen Arias ?MRN: [id number] ?Date of Birth: 20-Nov-1940 ? ?Today's Date: 01/08/2022 ?PT Individual Time: 7616-0737 ?PT Individual Time Calculation (min): 75 min  ? ?Short Term Goals: ?Week 1:  PT Short Term Goal 1 (Week 1): Will be able to perform bed to chair transfer with slideboard minA ?PT Short Term Goal 1 - Progress (Week 1): Met ?PT Short Term Goal 2 (Week 1): Will be able to perform supine to sit with modA ?PT Short Term Goal 2 - Progress (Week 1): Met ?PT Short Term Goal 3 (Week 1): Will be able to maintain dynamic sitting balance during reaching activity with feet supported minA ?PT Short Term Goal 3 - Progress (Week 1): Met ?PT Short Term Goal 4 (Week 1): Pt will initiate w/c mobility ?PT Short Term Goal 4 - Progress (Week 1): Met ? ?Skilled Therapeutic Interventions/Progress Updates:  ?[employer] Tele-interpreter utilized throughout session. Pt received lying in bed with daughter and PA present. Daughter states that pt has been extremely fatigued since yesterday d/t decreased appetite/intake and lack of sleep. Pt agreeable to therapy but increased assist with transfers noted as a result of decreased energy. Bed mobility performed lying <> sit with modA for BLE management, verbal cuing provided to utilize UE's to bring trunk upright. Bed <> w/c transfer via slideboard with CGA d/t patient's occasional LOB anteriorly, tactile cuing for hand placement; verbal cuing for head/hips required as fatigue increases throughout session. Pt initiated w/c mobility with supervision to/from therapy gym, 2x142f altogether. SB transfer to mat table to perform gravity-eliminated exercises in side-lying utilizing powder-board. MinA to roll R/L for 1LE management. Powder-board exercises attempted for 2x10 bilat for knee flex/ext and hip flex/ext; pt demonstrates partial active ROM at knee and hip in the LLE. RLE demonstrates trace strength in quadriceps, hamstrings, and hip  flexor. Pt also demonstrates emerging hip extensor strength with attempts to "bridge" up in order to scoot hips laterally on mat table, PT providing modA to stabilize LE's on the mat. Pt propelled back to room with BUEs. Pt presented to be incontinent of bowel. SB transfer > bed CGA and rolling in bed modA to change brief. Pt left lying in bed with safety alarm active and all needs met. ? ?Therapy Documentation ?Precautions:  ?Precautions ?Precautions: Fall ?Precaution Comments: foley ?Restrictions ?Weight Bearing Restrictions: No ? ? ? ?Therapy/Group: Individual Therapy ? ?RLanetta Inch?01/08/2022, 4:30 PM  ?

## 2022-01-08 NOTE — Discharge Instructions (Addendum)
Inpatient Rehab Discharge Instructions ? ?[name] Alert ?Discharge date and time:   ? ?Activities/Precautions/ Functional Status: ?Activity: no lifting, driving, or strenuous exercise till cleared by MD ?Diet: regular diet ?Wound Care: keep wound clean and dry. Foley care bid ? ? ?Functional status:  ?___ No restrictions     ___ Walk up steps independently ?___ 24/7 supervision/assistance   ___ Walk up steps with assistance ?___ Intermittent supervision/assistance  ___ Bathe/dress independently ?___ Walk with walker     ___ Bathe/dress with assistance ?___ Walk Independently    ___ Shower independently ?___ Walk with assistance    ___ Shower with assistance ?___ No alcohol     ___ Return to work/school ________ ? ?Special Instructions: ? ? ? ?My questions have been answered and I understand these instructions. I will adhere to these goals and the provided educational materials after my discharge from the hospital. ? ?Patient/Caregiver Signature _______________________________ Date __________ ? ?Clinician Signature _______________________________________ Date __________ ? ?Please bring this form and your medication list with you to all your follow-up doctor's appointments.   ?

## 2022-01-08 NOTE — Progress Notes (Signed)
Patient with 2 small and one moderate soft stools--cleaned after therapy. Patient unaware per NT. 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 Will d/c protonix and add fibercon to help bulk stools and add dig stim in am to help with evacuation prior to starting therapy.   ?

## 2022-01-08 NOTE — Progress Notes (Signed)
Continued bowel program from day shift with Dig Stim. Pt had medium loose bowel. Pt tolerated well with no adverse effect. ?

## 2022-01-08 NOTE — Progress Notes (Addendum)
Notified by Nurse Tech of patient's bowel movements this morning. Notified PA of patient's bowel movements/type this shift and per the flowsheet how many bowel movements/type throughout the night. Per daughter she request medication to slow down bowel movements.Per PA continue to monitor for right now. ? ? ? ?

## 2022-01-08 NOTE — Progress Notes (Signed)
Physical Therapy Session Note ? ?Patient Details  ?Name: Allen Arias ?MRN: [id number] ?Date of Birth: 02/03/1941 ? ?Today's Date: 01/08/2022 ?PT Individual Time: 1055-1205 ?PT Individual Time Calculation (min): 70 min  ? ?Short Term Goals: ?Week 1:  PT Short Term Goal 1 (Week 1): Will be able to perform bed to chair transfer with slideboard minA ?PT Short Term Goal 2 (Week 1): Will be able to perform supine to sit with modA ?PT Short Term Goal 3 (Week 1): Will be able to maintain dynamic sitting balance during reaching activity with feet supported minA ?PT Short Term Goal 4 (Week 1): Pt will initiate w/c mobility ? ?Skilled Therapeutic Interventions/Progress Updates: Pt presented in bed with NT present completing peri-care from BM. Pt agreeable to therapy once completed. Pt denies pain at rest. Pt did require rest breaks due to fatigue and possibly pain but did not state through interpreter. [employer] video interpreting  used throughout session initially with Joonhwan [id number] then Jeng [id number]. PTA donned shoes and leg loops total A and pt performed supine to sit with CGA and increased time with use of bed features. PTA set up Slide board total A and pt performed Slide board transfer to L to [name] w/c with minA due to cues required for increased anterior weight shifting and head/hips relationship. Pt then propelled to ortho gym with supervision. Performed Slide board transfer to R to high/low mat in same manner as prior. Pt required cues for scooting to edge of w/c. At mat pt participated in seated dynamic balance activities including leaning anteriorly towards PTA and using core ms (vs pushing with BUE) to midline 2 x 5. Pt also performed passing weighted ball (1.5Kg) behind back clockwise/counter-clockwise x 10 ea. Pt also performed ball taps with 2lb weighted dowel 2 x 15. Pt also participated in reaching activity with small challenges outside BOS to reach for bean bags. Pt required some encouragement to increase  anteirorlateral leans but only x 1 LOB requiring assist for correction. Pt appeared to be incontinent of bowels therefore performed Slide board transfer to return to w/c in same manner as prior. Pt propelled back to room supervision level and performed Slide board transfer to return to bed. Pt required modA for sit to supine as required assist for LE management. In bed pt was able to perform rolling L/R with minA for brief management and peri-care. Pt repositioned to comfort once completed and left with bed alarm on, call bell within reach and needs met.  ?   ? ?Therapy Documentation ?Precautions:  ?Precautions ?Precautions: Fall ?Precaution Comments: foley ?Restrictions ?Weight Bearing Restrictions: No ?General: ?  ?Vital Signs: ? ?Pain: ?Pain Assessment ?Pain Scale: 0-10 ?Pain Score: 0-No pain ?Mobility: ?  ?Locomotion : ?   ?Trunk/Postural Assessment : ?   ?Balance: ?  ?Exercises: ?  ?Other Treatments:   ? ? ? ?Therapy/Group: Individual Therapy ? ?Rosita DeChalus ?01/08/2022, 12:13 PM  ?

## 2022-01-09 DIAGNOSIS — N3000 Acute cystitis without hematuria: Secondary | ICD-10-CM

## 2022-01-09 LAB — BASIC METABOLIC PANEL
Anion gap: 7 (ref 5–15)
BUN: 24 mg/dL — ABNORMAL HIGH (ref 8–23)
CO2: 23 mmol/L (ref 22–32)
Calcium: 8.4 mg/dL — ABNORMAL LOW (ref 8.9–10.3)
Chloride: 104 mmol/L (ref 98–111)
Creatinine, Ser: 0.81 mg/dL (ref 0.61–1.24)
GFR, Estimated: >60 mL/min (ref >60)
Glucose, Bld: 96 mg/dL (ref 70–99)
Potassium: 3.8 mmol/L (ref 3.5–5.1)
Sodium: 134 mmol/L — ABNORMAL LOW (ref 135–145)

## 2022-01-09 LAB — CBC WITH DIFFERENTIAL/PLATELET
Abs Immature Granulocytes: 0.18 10*3/uL — ABNORMAL HIGH (ref 0.00–0.07)
Basophils Absolute: 0 10*3/uL (ref 0.0–0.1)
Basophils Relative: 0 %
Eosinophils Absolute: 0 10*3/uL (ref 0.0–0.5)
Eosinophils Relative: 0 %
HCT: 25.8 % — ABNORMAL LOW (ref 39.0–52.0)
Hemoglobin: 8.5 g/dL — ABNORMAL LOW (ref 13.0–17.0)
Immature Granulocytes: 1 %
Lymphocytes Relative: 2 %
Lymphs Abs: 0.4 10*3/uL — ABNORMAL LOW (ref 0.7–4.0)
MCH: 32.9 pg (ref 26.0–34.0)
MCHC: 32.9 g/dL (ref 30.0–36.0)
MCV: 100 fL (ref 80.0–100.0)
Monocytes Absolute: 0.6 10*3/uL (ref 0.1–1.0)
Monocytes Relative: 4 %
Neutro Abs: 13.7 10*3/uL — ABNORMAL HIGH (ref 1.7–7.7)
Neutrophils Relative %: 93 %
Platelets: 220 10*3/uL (ref 150–400)
RBC: 2.58 MIL/uL — ABNORMAL LOW (ref 4.22–5.81)
RDW: 20.1 % — ABNORMAL HIGH (ref 11.5–15.5)
WBC: 14.8 10*3/uL — ABNORMAL HIGH (ref 4.0–10.5)
nRBC: 0 % (ref 0.0–0.2)

## 2022-01-09 NOTE — Progress Notes (Signed)
Occupational Therapy Session Note ? ?Patient Details  ?Name: Allen Arias ?MRN: [id number] ?Date of Birth: 09/20/1940 ? ?Today's Date: 01/09/2022 ?OT Individual Time: [id number] ?OT Individual Time Calculation (min): 75 min  ? ? ?Short Term Goals: ?Week 2:  OT Short Term Goal 1 (Week 2): Pt will compeltes SB transfer to [facility] wiht MOD A ?OT Short Term Goal 2 (Week 2): Pt will maintain circle sitting wiht MIN A in prep for LB dressing for 5 min ?OT Short Term Goal 3 (Week 2): Pt will complete UB dressing with min A seated EOB ?OT Short Term Goal 4 (Week 2): Pt will perform supine>sit EOB with min A in preparation for transfers ? ?Skilled Therapeutic Interventions/Progress Updates:  ?  [employer] interpreter [id number] Danae Chen.) Pt resting in bed upon arrival and agreeable to sitting EOB and transferring to w/c. Pt agreeable to washing up and changing clothing. Pt incontinent of bladder (overflow). Dependent for toileting tasks at bed level. Rolling R/L in bed with min A using bed rails. Supine>sit EOB with min A. Pt able to move BLE off EOB with min A when in sidelying. Sidelying>sit EOB with min A. Sitting balance EOB supervision to doff shirt. Transfer to w/c with min A using SB. Pt completed UB bathing and washing hair at sink with supervision. Pt donned shirt with supervision. Pt requested to return to bed at end of session. SB tranfser to bed with supervision. Pt remained in bed with all needs within reach and bed alarm activated. ? ?Therapy Documentation ?Precautions:  ?Precautions ?Precautions: Fall ?Precaution Comments: foley ?Restrictions ?Weight Bearing Restrictions: No ?  ?Pain: ? Pt c/o BLE 4/10 pain; MD aware ? ? ?Therapy/Group: Individual Therapy ? ?Leroy Libman ?01/09/2022, 9:36 AM ?

## 2022-01-09 NOTE — Progress Notes (Signed)
Bowel program performed, patient tolerated well. Patient had a small bowel movement. Notified on coming nurse. ?

## 2022-01-09 NOTE — Progress Notes (Signed)
?                                                       PROGRESS NOTE ? ? ?Subjective/Complaints: ? ?Reports no new complaints this am. Does not have TEDs yet. Reports he slept well last night.  ?Interpreter assisted today. ? ?UTI ? ?ROS:No CP, SOB, HA ?Objective: ?  ?No results found. ? ?Recent Labs  ?  01/08/22 ?0522 01/09/22 ?[id number]  ?WBC 15.6* 14.8*  ?HGB 8.4* 8.5*  ?HCT 25.5* 25.8*  ?PLT 211 220  ? ? ?Recent Labs  ?  01/08/22 ?0522 01/09/22 ?[id number]  ?NA 135 134*  ?K 3.8 3.8  ?CL 104 104  ?CO2 24 23  ?GLUCOSE 95 96  ?BUN 23 24*  ?CREATININE 0.73 0.81  ?CALCIUM 8.3* 8.4*  ? ? ? ?Intake/Output Summary (Last 24 hours) at 01/09/2022 0752 ?Last data filed at 01/08/2022 1834 ?Gross per 24 hour  ?Intake 720 ml  ?Output 80 ml  ?Net 640 ml  ? ?  ? ?  ? ?Physical Exam: ? ? ? ? ?General: awake, alert, appropriate, sitting up in bed; using tele interpretor to communicate; NAD ?HENT: conjugate gaze; oropharynx moist ?CV: regular rate; no JVD ?Pulmonary: CTA B/L; no W/R/R- good air movement ?GI: soft, NT, ND, (+)BS- hyperactive mildly.  ?Psychiatric: appropriate ?Neurological: Ox3 ?GU- wearing brief ?Neurological: Alert, answers questions and follows commands ?Musc:  1/5 in RLE, 2-/5 in LLE ?Skin: Bilateral groin incision, flat, non-tender, non-swollen, skin glue, RLE incision: flat, non-tender, non-swollen, skin glue ?Extremities: nonpitting 1-2+ LE edema up into thighs B/L  ? ?Vital Signs ?Blood pressure 134/68, pulse 85, temperature 98.5 ?F (36.9 ?C), resp. rate 20, height 5\' 4"  (1.626 m), weight 49.8 kg, SpO2 97 %. ? ? ? ?Assessment/Plan: ?1. Functional deficits which require 3+ hours per day of interdisciplinary therapy in a comprehensive inpatient rehab setting. ?Physiatrist is providing close team supervision and 24 hour management of active medical problems listed below. ?Physiatrist and rehab team continue to assess barriers to discharge/monitor patient progress toward functional and medical goals ? ?Care Tool: ? ?Bathing ?    ?Body parts bathed by patient: Right arm, Left arm, Chest, Abdomen, Right upper leg, Left upper leg, Face  ? Body parts bathed by helper: Buttocks, Right lower leg, Left lower leg ?Body parts n/a: Front perineal area ?  ?Bathing assist Assist Level: Moderate Assistance - Patient 50 - 74% ?  ?  ?Upper Body Dressing/Undressing ?Upper body dressing   ?What is the patient wearing?: Pull over shirt ?   ?Upper body assist Assist Level: Contact Guard/Touching assist ?   ?Lower Body Dressing/Undressing ?Lower body dressing ? ? ?   ?What is the patient wearing?: Pants, Incontinence brief ? ?  ? ?Lower body assist Assist for lower body dressing: Total Assistance - Patient < 25% ?   ? ?Toileting ?Toileting    ?Toileting assist Assist for toileting: Total Assistance - Patient < 25% ?  ?  ?Transfers ?Chair/bed transfer ? ?Transfers assist ?   ? ?Chair/bed transfer assist level: Contact Guard/Touching assist (via slideboard) ?  ?  ?Locomotion ?Ambulation ? ? ?Ambulation assist ? ? Ambulation activity did not occur: Safety/medical concerns ? ?  ?  ?   ? ?Walk 10 feet activity ? ? ?Assist ? Walk 10 feet activity did not occur:  Safety/medical concerns ? ?  ?   ? ?Walk 50 feet activity ? ? ?Assist Walk 50 feet with 2 turns activity did not occur: Safety/medical concerns ? ?  ?   ? ? ?Walk 150 feet activity ? ? ?Assist Walk 150 feet activity did not occur: Safety/medical concerns ? ?  ?  ?  ? ?Walk 10 feet on uneven surface  ?activity ? ? ?Assist Walk 10 feet on uneven surfaces activity did not occur: Safety/medical concerns ? ? ?  ?   ? ?Wheelchair ? ? ? ? ?Assist Is the patient using a wheelchair?: Yes ?Type of Wheelchair: Manual ?  ? ?Wheelchair assist level: Supervision/Verbal cueing ?Max wheelchair distance: 152ft  ? ? ?Wheelchair 50 feet with 2 turns activity ? ? ? ?Assist ? ?  ?  ? ? ?Assist Level: Supervision/Verbal cueing  ? ?Wheelchair 150 feet activity  ? ? ? ?Assist ?   ? ? ?Assist Level: Dependent - Patient 0%  ? ?Blood  pressure 134/68, pulse 85, temperature 98.5 ?F (36.9 ?C), resp. rate 20, height 5\' 4"  (1.626 m), weight 49.8 kg, SpO2 97 %. ? ?Medical Problem List and Plan: ?1. 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 ?            - Patient may shower but incisions must be covered. ?             if going home /dc 5/25; if going to SNF 5/11- family unhappy with d/c date- however cannot get 24/7 care at home ? Con't CIR- PT and OT- working on transfers and Adls mainly and w/c.  ? Team conference today to determine length of stay- also discussed with nursing at length about B/B issues ?2.  Antithrombotics: ?-DVT/anticoagulation: Mechanical: Sequential compression devices, below knee bilateral lower extremities ?- Check Dopplers in a.m. ?-01/01/2022 cannot be on Lovenox or Eliquis due to having low platelets with a risk of bleeding.  However if his platelets get above 100 K may consider starting.   ?-4/27 Vas [place]: Negative for DVT bilateral ?-4/29 Continue SCD's, while trending Plts ?-4/29 Plts today 132, up from 73 on 4/27, continue to trend to    ensure stays above 100K ?-4/30 Plts at 154 today, continue to trend with am labs ?5/1- plts 206- will start Lovenox since high risk for DVT and recheck labs Thursday.  ?5/5 plts 220- continue to monitor ? -antiplatelet therapy:N/A ?3. Pain Management/Post-Op: Start Tylenol 650 mg 3 times daily.  Oxycodone as needed. ? 5/1- denies pain- con't regimen prn ?4. Mood: Team to provide a good support.  May need antidepressant-family and patient some disappointed and had multiple questions anxiety with regards to recovery and interventions potentially seeking second opinion etc.  Discussed with Dr. Willaim Sheng specially on spinal cord pathology. ?-01/01/2022 we will start Celexa 20 mg daily for mood. ?-4/29 Mood appropriate today. ?- Antipsychotic agents: Not applicable ?5. Neuropsych: This patient may be capable of making decisions on his own behalf. ?6.  Skin/Wound Care: Routine pressure relief measures.  ?            --Gerhadt's cream to scrotal breakdown ?           -4/30-Continue use of cream as protective barrier. ?  ? ?7. Fluids/Electrolytes/Nutrition: Routine in and outs with follow-up chemistries ?8.  History of BPH/urinary retention.  Continue to hold Hytrin and Flomax for now. ?- Monitor BP for any orthostatic changes/drops. ?-4/30 Pending orthostatic vitals.  Pt voiding  adequately s/p Foley removal. ?5/1- will increase Flomax to 0.8 mg q supper and see if retaining- I think urinary incontinence is due to overflow incontinence- needs to be cathed q6 hours- of might need ot change to q4 hours if need be. [name] Kitchen  ?5/2- not getting cathed, but appears to be overflow- d/w charge and Retail buyer- and nurse- will change bladder scans to q4 hours and cath if volumes >350cc ?5/3- looks like pt cathed once- and bladder scans q4 hours are less than 300cc, however is leaking- will stop Flomax since likely stopping him from retaining enough/having incontinence- and monitor ?9.  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     -Has varied from 100 down to 44 down to 56 ?-4/29 Plts today 132, up from 73 on 4/27, continue to trend to    ensure stays above 100K ?-4/30 Plts at 154 today, continue to trend with am labs ?5/1- Plts 206- start Lovenox ?10.  ABLA: Monitor H/H for stability. ?- Improved from 5.7-10.  Recheck CBC in AM. ?-01/01/22 hemoglobin 9.1. ?-01/03/22 today hemoglobin has dropped to 7.0 there are no obvious signs of bleeding.  Patient does not report seeing visible blood in stool or urine.  New orders for stool guaiac x3 UA to rule out blood in urine repeat H&H and orthostatic blood pressure to monitor for symptoms. ?-4/29 U/A negative for blood in urine. ?-4/30 Hgb up to 7.4 today from 7.0 yesterday, CBC in am to trend. Pending stool guaiac.  ? 5/1- Hb 8.1 so don't know why it dropped so much- con't to monitor ?5/4- HGB 8.4 today, appears to be  stable, continue to monitor ? 5/5 hgb stable at 8.5, continue to monitor ?11.  Insomnia: Slept well last night with current trazodone.  Continue as needed. ? -4/30 Pt reports that trazodone ineffe

## 2022-01-09 NOTE — Progress Notes (Signed)
Dig Stim performed per provider's order. Smear of BM removed from rectum. Pt tolerated well. ?

## 2022-01-09 NOTE — Progress Notes (Signed)
Physical Therapy Session Note ? ?Patient Details  ?Name: Allen Arias ?MRN: [id number] ?Date of Birth: 1941-05-15 ? ?Today's Date: 01/09/2022 ?PT Individual Time: 8657-8469 and 6295-2841 ?PT Individual Time Calculation (min): 53 min and 75 min ? ?Short Term Goals: ?Week 2:  PT Short Term Goal 1 (Week 2): Will be able to maintain dynamic sitting balance with feet supported during reaching activity CGA ?PT Short Term Goal 2 (Week 2): Will be able to demonstrate bed/chair transfer CGA without requiring verbal cuing for hand placement ?PT Short Term Goal 3 (Week 2): Will be able to perform supine <> sit with CGA utilizing leg straps ? ?Skilled Therapeutic Interventions/Progress Updates: Tx1: Pt presented in bed agreeable to therapy. Use of [employer] interpreter used throughout session Allen Arias [phone]. Pt states some pain at incision sites but did not rate and no pain behaviors shown during session. PTA donned leg loops total A and pt was able to perform supine to sit with CGA and increased time/effort. With verbal cues pt able to scoot to EOB with CGA and perform lateral lean to allow PTA to place Slide board under pt. Pt performed Slide board transfer to w/c with minA due to feet not touching floor and PTA requiring cues to increase anterior lean. Pt then transported to day room for energy conservation. Pt set up at [employer] and participated in BLE activation for forced use of BLE 10 cycles at 80cm/sec x 3 bouts. PTA assisted with deloading LLE to allow pt to push through with RLE more effectively. Pt did require increased time between bouts due to fatigue. Pt then transported to high/low mat and performed Slide board transfer to mat with total A for set up and CGA for transfer. Pt participated in dynamic balance activity reaching forward placing horseshoes on basketball rim 2 x 10. Pt required so encouragement and facilitation to increase lean as pt would attempt to rotate trunk for increased distance. Pt able to reach  minimally outside BOS with minA. Pt returned to w/c in same manner as prior and propelled w/c ~162f with supervision. Pt transported remaining distance to room. In room pt performed Slide board transfer to bed with minA and modA for sit to supine. Pt repositioned to comfort and left in bed with bed alarm on, call bell within reach and needs met.  ? ?Tx2: Pt presented in bed agreeable to therapy. Pt denies pain at rest. [employer] interpreter used during session #[id number]. PTA donned leg loops and shoes total A. Pt performed supine to sit EOB with CGA and increased time. Performed Slide board transfer to w/c with CGA. Pt propelled to ortho gym with supervision. Set up in standing frame. Pt participated in x 3 bouts in standing frame for increased weight bearing through BLE. On first bout pt was able to perform TKE x 10 on LLE and AA fading to active TKE on RLE x 10. On second bout with PTA stabilizing hips to minimze hip thrusting compensation pt performed mini squats (~20degrees of flexion in knees) with PTA providing some assistance at RLE. On third bout pt performed mini squat to stand and with PTA x 5, on 5th bout PTA assisted pt in maintaining TKE and pt performed lateral weight shifting x 30 sec. Pt then propelled back to room weaving in/out of cones for improved w/c mobility. Pt transferred back to bed in same manner as prior. Performed sit to supine with modA. Pt noted to have incontinent BM. Pt then performed rolling L/R with minA nearing CGA  to allow PTA to perform peri-care and change brief. Pt noted to have soiled pants therefore PTA changed pants total A. Pt left in bed at end of session with bed alarm on, call bell within reach and needs met.  ?   ? ?Therapy Documentation ?Precautions:  ?Precautions ?Precautions: Fall ?Precaution Comments: foley ?Restrictions ?Weight Bearing Restrictions: No ? ? ?Therapy/Group: Individual Therapy ? ?Allen Arias ?Miku Udall, PTA ?01/09/2022, 12:39 PM  ?

## 2022-01-09 NOTE — Progress Notes (Signed)
Continued Bowel Program from previous shift. Pt had large bowel from rectum. Pt tolerated well.  ?

## 2022-01-10 NOTE — Progress Notes (Signed)
PROGRESS NOTE   Subjective/Complaints: Patient seen at bedside reports that he did not feel well this morning.  He felt little bit feverish however vital sign report shows he was afebrile.  He is currently being treated for a UTI with cultures pending he had a positive UA on 5/4.  Patient reports that he slept well until early this morning when he did not feel well.  ROS: Limited by language.  Use of interpreter services patient was able to state no CP, SOB, headache.   Objective:   No results found.  Recent Labs    01/08/22 0522 01/09/22 0620  WBC 15.6* 14.8*  HGB 8.4* 8.5*  HCT 25.5* 25.8*  PLT 211 220   Recent Labs    01/08/22 0522 01/09/22 0620  NA 135 134*  K 3.8 3.8  CL 104 104  CO2 24 23  GLUCOSE 95 96  BUN 23 24*  CREATININE 0.73 0.81  CALCIUM 8.3* 8.4*    Intake/Output Summary (Last 24 hours) at 01/10/2022 1652 Last data filed at 01/10/2022 1336 Gross per 24 hour  Intake 720 ml  Output --  Net 720 ml        Physical Exam:  Constitutional: Awake alert appropriate lying in bed.  No distress . Vital signs reviewed. HENT: Conjugate gaze, oropharynx moist.  Normocephalic.  Atraumatic. Eyes: EOMI. No discharge. Cardiovascular: RRR. No JVD. Respiratory: CTA Bilaterally. Normal effort.  No W/R/R good air movement movement GI: BS +. Non-distended. + BS, normoactive GU: Incontinence of urine, wearing brief. Neurological: Alert son in room interpreting. Musc: No edema. 1/5 in RLE, 2-/5 in LLE Extremities nonpitting 1-2+ LE edema up to thighs bilateral. Skin: Bilateral groin incision, flat, non-tender, non-swollen, skin glue, RLE incision: flat, non-tender, non-swollen, skin glue Psychiatric appropriate-flattened affect  Vital Signs Blood pressure (!) 143/70, pulse 83, temperature 98.2 F (36.8 C), temperature source Oral, resp. rate 16, height 5\' 4"  (1.626 m), weight 49.8 kg, SpO2 99  %.    Assessment/Plan: 1. Functional deficits which require 3+ hours per day of interdisciplinary therapy in a comprehensive inpatient rehab setting. Physiatrist is providing close team supervision and 24 hour management of active medical problems listed below. Physiatrist and rehab team continue to assess barriers to discharge/monitor patient progress toward functional and medical goals  Care Tool:  Bathing    Body parts bathed by patient: Right arm, Left arm, Chest, Abdomen, Right upper leg, Left upper leg, Face   Body parts bathed by helper: Buttocks, Right lower leg, Left lower leg Body parts n/a: Front perineal area   Bathing assist Assist Level: Moderate Assistance - Patient 50 - 74%     Upper Body Dressing/Undressing Upper body dressing   What is the patient wearing?: Pull over shirt    Upper body assist Assist Level: Contact Guard/Touching assist    Lower Body Dressing/Undressing Lower body dressing      What is the patient wearing?: Pants, Incontinence brief     Lower body assist Assist for lower body dressing: Total Assistance - Patient < 25%     Toileting Toileting    Toileting assist Assist for toileting: Total Assistance - Patient < 25%  Transfers Chair/bed transfer  Transfers assist     Chair/bed transfer assist level: Contact Guard/Touching assist (via slideboard)     Locomotion Ambulation   Ambulation assist   Ambulation activity did not occur: Safety/medical concerns          Walk 10 feet activity   Assist  Walk 10 feet activity did not occur: Safety/medical concerns        Walk 50 feet activity   Assist Walk 50 feet with 2 turns activity did not occur: Safety/medical concerns         Walk 150 feet activity   Assist Walk 150 feet activity did not occur: Safety/medical concerns         Walk 10 feet on uneven surface  activity   Assist Walk 10 feet on uneven surfaces activity did not occur: Safety/medical  concerns         Wheelchair     Assist Is the patient using a wheelchair?: Yes Type of Wheelchair: Manual    Wheelchair assist level: Supervision/Verbal cueing Max wheelchair distance: 173ft    Wheelchair 50 feet with 2 turns activity    Assist        Assist Level: Supervision/Verbal cueing   Wheelchair 150 feet activity     Assist      Assist Level: Dependent - Patient 0%   Blood pressure (!) 143/70, pulse 83, temperature 98.2 F (36.8 C), temperature source Oral, resp. rate 16, height  (1.626 m), weight 49.8 kg, SpO2 99 %.  Medical Problem List and Plan: 1. 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             - Patient may shower but incisions must be covered.              -ELOS/Goals: Min 10 to 14 days 2.  Antithrombotics: -DVT/anticoagulation: Mechanical: Sequential compression devices, below knee bilateral lower extremities - Check Dopplers in a.m. -01/01/2022 cannot be on Lovenox or Eliquis due to having low platelets with a risk of bleeding.  However if his platelets get above 100 K may consider starting.   -4/27 Vas [place]: Negative for DVT bilateral -4/29 Continue SCD's, while trending Plts -4/29 Plts today 132, up from 73 on 4/27, continue to trend to    ensure stays above 100K -4/30 Plts at 154 today, continue to trend with am labs 5/1- plts 206- will start Lovenox since high risk for DVT and recheck labs Thursday.  5/5 plts 220- continue to monitor 5/6 no new labs but will trend platelets with the q. Monday labs.    Latest Ref Rng & Units 01/09/2022    6:20 AM 01/08/2022    5:22 AM 01/05/2022    6:38 AM  CBC  WBC 4.0 - 10.5 K/uL 14.8   15.6   7.2    Hemoglobin 13.0 - 17.0 g/dL 8.5   8.4   8.1    Hematocrit 39.0 - 52.0 % 25.8   25.5   25.8    Platelets 150 - 400 K/uL 220   211   206       -antiplatelet therapy:N/A 3. Pain Management/Post-Op: Start Tylenol 650 mg 3 times daily.  Oxycodone as needed. 4. Mood: Team to  provide a good support.  May need antidepressant-family and patient some disappointed and had multiple questions anxiety with regards to recovery and interventions potentially seeking second opinion etc.  Discussed with Dr. Johnsie Cancel specially on spinal cord pathology. -01/01/2022 we will start Celexa 20  mg daily for mood. -4/29 Mood appropriate today. - Antipsychotic agents: Not applicable 5. Neuropsych: This patient may be capable of making decisions on his own behalf. 6. Skin/Wound Care: Routine pressure relief measures.              --Gerhadt's cream to scrotal breakdown            -4/30-Continue use of cream as protective barrier.    7. Fluids/Electrolytes/Nutrition: Routine in and outs with follow-up chemistries -4/29 continue to check Cmet 8.  History of BPH/urinary retention.  Continue to hold Hytrin and Flomax for now. - Monitor BP for any orthostatic changes/drops. -4/30 Pending orthostatic vitals.  Pt voiding adequately s/p Foley removal. 9.  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 10.  ABLA: Monitor H/H for stability. - Improved from 5.7-10.  Recheck CBC in AM. -01/01/22 hemoglobin 9.1. -01/03/22 today hemoglobin has dropped to 7.0 there are no obvious signs of bleeding.  Patient does not report seeing visible blood in stool or urine.  New orders for stool guaiac x3 UA to rule out blood in urine repeat H&H and orthostatic blood pressure to monitor for symptoms. -4/29 U/A negative for blood in urine. -4/30 Hgb up to 7.4 today from 7.0 yesterday, CBC in am to trend. Pending stool guaiac.  5/1- Hb 8.1 so don't know why it dropped so much- con't to monitor 5/4- HGB 8.4 today, appears to be stable,  continue to monitor  5/5 hgb stable at 8.5, continue to monitor  5/6 we will continue to trend hemoglobin on q. Monday labs     Latest Ref Rng & Units 01/09/2022    6:20 AM 01/08/2022    5:22 AM 01/05/2022    6:38 AM  CBC  WBC 4.0 - 10.5 K/uL 14.8   15.6   7.2    Hemoglobin 13.0 - 17.0 g/dL 8.5   8.4   8.1    Hematocrit 39.0 - 52.0 % 25.8   25.5   25.8    Platelets 150 - 400 K/uL 220   211   206      11.  Insomnia: Slept well last night with current trazodone.  Continue as needed.  -4/30 Pt reports that trazodone ineffective for sleep.  Trial of 3 mg melatonin to if this works better for the patient. 5/3- will change to Ambien 5 mg QHS- due ot age, cannot do a higher dose.              5/4 insomnia improved last night             5/5 insomnia continues to be improved, follow            5/6 patient says slept well with current medication of Ambien 5 mg nightly continue this plan. 12.  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 - Check KUB for constipation/stool burden burden. - 01/01/22-start bowel program q. nightly with digital stimulation with suppository.  Educated patient and son on bowel program. 4/28-has not been eating but has had small bowel movement with the bowel program.   -4/29 has had stool  today -4/30 No constipation reported, LBM 4/30 5/1- not clear if had BM/bowel program last night- have called nursing to figure out what's going on with bowel program             5/2- BM last night with program- will move to q2 days since pt says at home was q2-4 days would have BM- 5/6 patient reports BM 2 days ago which is not uncommon for his normal.  Continue bowel program as prescribed. 13.  Neurogenic bladder: Had Foley due to urinary retention and low blood pressure -01/01/2022 plans to remove Foley in the morning on 428 and start Flomax 0.4 mg q. nightly because he was having low blood pressure up until now-we will monitor and stop if BP goes  low again. -01/02/2022 Foley was removed around lunchtime. -01/03/2022 patient has voided twice since Foley removal however because patient is incontinent these were recorded as an occurrence instead of a volume.  Patient family and nurse said that he has been able to void without issue since Foley has been removed. -4/30 Patient continues to void but is incontinent r/t neurogenic bladder    5/1- think voiding is overflow- need bladder scans to be clear.     5/2- change bladder scans to q4 hours- and see if can void with urinal?    5/3- voiding incontinently- worried will have more skin breakdown- will stop 5/6 continue to monitor voiding with of every 4 hour bladder scans patient is not reporting issues with voiding today however continue to monitor for possible overflow. 14.  Azotemia - 01/01/2022 BUN 38 was 21-we will give IVF's-50 cc/h and recheck in a.m. if not better tomorrow, will not remove Foley. 01/02/2022 BUN down to 33-we will continue IVF's for the rest of the day today and remove Foley this afternoon patient advised to drink more and recheck labs Monday morning 01/05/2022 -4/29 BUN trending down at 33 (4/28) from 38 on 4/27 -4/30 qMonday labs to trend 5/1- BUN down to 24- con't to push fluids 5/4 BUN down to 23, continue to monitor 5/6 BUN at 24 creatinine at 0.81 encourage fluids adequate hydration.     Latest Ref Rng & Units 01/09/2022    6:20 AM 01/08/2022    5:22 AM 01/05/2022    6:38 AM  BMP  Glucose 70 - 99 mg/dL 96   95   99    BUN 8 - 23 mg/dL 24   23   24     Creatinine 0.61 - 1.24 mg/dL 1.61   0.96   0.45    Sodium 135 - 145 mmol/L 134   135   135    Potassium 3.5 - 5.1 mmol/L 3.8   3.8   3.6    Chloride 98 - 111 mmol/L 104   104   103    CO2 22 - 32 mmol/L 23   24   26     Calcium 8.9 - 10.3 mg/dL 8.4   8.3   8.3      15.  Cholelithiasis-  -4/28 no acute cholecystitis 16. LE edema- nonpitting             5/3- will reorder TEDs- thigh highs. For during day.               5/4- OT to provide teds today             5/6 continue to implement TEDs with help from OT 17.UTI             -  5/5 Keflex was started for treatment             -Follow culture results           5/6 continue Keflex culture results are still pending we will follow up once these are posted.   LOS: 10 days A FACE TO FACE EVALUATION WAS PERFORMED  Tressia Miners, FNP 01/10/2022, 4:52 PM

## 2022-01-10 NOTE — Progress Notes (Signed)
Dig Stim performed this am per providers's order with a smear from rectum. Pt tolerated well. ?

## 2022-01-11 LAB — URINE CULTURE: Culture: >=100000 — AB

## 2022-01-11 NOTE — Progress Notes (Signed)
Dig Stim performed this am with no stool in rectum. No bm noted. Pt tolerated well. ?

## 2022-01-11 NOTE — Progress Notes (Signed)
Physical Therapy Session Note ? ?Patient Details  ?Name: Allen Arias ?MRN: [id number] ?Date of Birth: [birth year] ? ?Today's Date: 01/11/2022 ?PT Individual Time: 0900-1000 ?PT Individual Time Calculation (min): 60 min  ? ?Short Term Goals: ?Week 2:  PT Short Term Goal 1 (Week 2): Will be able to maintain dynamic sitting balance with feet supported during reaching activity CGA ?PT Short Term Goal 2 (Week 2): Will be able to demonstrate bed/chair transfer CGA without requiring verbal cuing for hand placement ?PT Short Term Goal 3 (Week 2): Will be able to perform supine <> sit with CGA utilizing leg straps ? ? ?Skilled Therapeutic Interventions/Progress Updates:  ?  Pt received seated in bed, agreeable to PT session. Utilized [employer] interpreter [phone] throughout session. Pt reports ongoing pain at site of incisions in his groin region, not rated and declines intervention. Seated in bed to sitting EOB with mod A needed for BLE management. Pt is able to maintain sitting balance EOB with close Supervision with occasional use of one UE to support himself while engaging in oral hygiene and while shaving his face. Pt is max A to don shoes while seated EOB. Slide board transfer bed to w/c with CGA. Manual w/c propulsion 2 x 100 ft with use of BUE at Supervision level. Sit to stand in standing frame x 4 reps dependently via use of sling. Pt able to perform mini-squats in standing frame from full, upright position to semi-perched position with heavy reliance on BUE to assist. Palpable glute and quad activation in LLE>RLE. Per pt report his BLE are doing 30% of the work, UE 70% to assist with standing. Pt fatigues very quickly with standing activity and takes seated rest breaks between each repetition. Pt requests to remain seated in w/c at end of session, quick release belt and chair alarm in place, needs in reach. ? ?Therapy Documentation ?Precautions:  ?Precautions ?Precautions: Fall ?Precaution Comments:  foley ?Restrictions ?Weight Bearing Restrictions: No ? ? ? ? ? ? ?Therapy/Group: Individual Therapy ? ? ?Peter Congo, PT, DPT, CSRS ?01/11/2022, 12:46 PM  ?

## 2022-01-11 NOTE — Progress Notes (Signed)
PROGRESS NOTE   Subjective/Complaints: Patient seen at bedside reports that he feels better this morning without complaints of feeling febrile.  Says that his appetite is slowly improving,  However, he has some fatigue after working with therapy and sitting up in chair too long.  ROS: Limited by language.  Use of interpreter services patient was able to state no CP, SOB, headache, N/V/D.   Objective:   No results found.  Recent Labs    01/09/22 0620  WBC 14.8*  HGB 8.5*  HCT 25.8*  PLT 220   Recent Labs    01/09/22 0620  NA 134*  K 3.8  CL 104  CO2 23  GLUCOSE 96  BUN 24*  CREATININE 0.81  CALCIUM 8.4*    Intake/Output Summary (Last 24 hours) at 01/11/2022 1037 Last data filed at 01/10/2022 1828 Gross per 24 hour  Intake 480 ml  Output --  Net 480 ml        Physical Exam:  Constitutional: Awake alert appropriate lying in bed.  No distress . Vital signs reviewed. HENT: Conjugate gaze, oropharynx moist.  Normocephalic.  Atraumatic. Eyes: EOMI. No discharge. Cardiovascular: RRR. No JVD. Respiratory: CTA Bilaterally. Normal effort.  No W/R/R good air movement movement GI: BS +. Non-distended. + BS, normoactive GU: Incontinence of urine, wearing brief. Neurological: Alert son in room interpreting. Musc: No edema. 1/5 in RLE, 2-/5 in LLE Extremities nonpitting 1-2+ LE edema up to thighs bilateral. Skin: Bilateral groin incision, flat, non-tender, non-swollen, skin glue, RLE incision: flat, non-tender, non-swollen, skin glue Psychiatric appropriate-flattened affect  Physical exam unchanged since 5/6  Vital Signs Blood pressure 137/70, pulse 77, temperature 98.3 F (36.8 C), temperature source Oral, resp. rate 16, height 5\' 4"  (1.626 m), weight 49.8 kg, SpO2 97 %.    Assessment/Plan: 1. Functional deficits which require 3+ hours per day of interdisciplinary therapy in a comprehensive inpatient rehab  setting. Physiatrist is providing close team supervision and 24 hour management of active medical problems listed below. Physiatrist and rehab team continue to assess barriers to discharge/monitor patient progress toward functional and medical goals  Care Tool:  Bathing    Body parts bathed by patient: Right arm, Left arm, Chest, Abdomen, Right upper leg, Left upper leg, Face   Body parts bathed by helper: Buttocks, Right lower leg, Left lower leg Body parts n/a: Front perineal area   Bathing assist Assist Level: Moderate Assistance - Patient 50 - 74%     Upper Body Dressing/Undressing Upper body dressing   What is the patient wearing?: Pull over shirt    Upper body assist Assist Level: Contact Guard/Touching assist    Lower Body Dressing/Undressing Lower body dressing      What is the patient wearing?: Pants, Incontinence brief     Lower body assist Assist for lower body dressing: Total Assistance - Patient < 25%     Toileting Toileting    Toileting assist Assist for toileting: Total Assistance - Patient < 25%     Transfers Chair/bed transfer  Transfers assist     Chair/bed transfer assist level: Contact Guard/Touching assist (via slideboard)     Locomotion Ambulation   Ambulation assist  Ambulation activity did not occur: Safety/medical concerns          Walk 10 feet activity   Assist  Walk 10 feet activity did not occur: Safety/medical concerns        Walk 50 feet activity   Assist Walk 50 feet with 2 turns activity did not occur: Safety/medical concerns         Walk 150 feet activity   Assist Walk 150 feet activity did not occur: Safety/medical concerns         Walk 10 feet on uneven surface  activity   Assist Walk 10 feet on uneven surfaces activity did not occur: Safety/medical concerns         Wheelchair     Assist Is the patient using a wheelchair?: Yes Type of Wheelchair: Manual    Wheelchair assist  level: Supervision/Verbal cueing Max wheelchair distance: 139ft    Wheelchair 50 feet with 2 turns activity    Assist        Assist Level: Supervision/Verbal cueing   Wheelchair 150 feet activity     Assist      Assist Level: Dependent - Patient 0%   Blood pressure 137/70, pulse 77, temperature 98.3 F (36.8 C), temperature source Oral, resp. rate 16, height 5\' 4"  (1.626 m), weight 49.8 kg, SpO2 97 %.  Medical Problem List and Plan: 1. 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             - Patient may shower but incisions must be covered.              -ELOS/Goals: Min 10 to 14 days 2.  Antithrombotics: -DVT/anticoagulation: Mechanical: Sequential compression devices, below knee bilateral lower extremities - Check Dopplers in a.m. -01/01/2022 cannot be on Lovenox or Eliquis due to having low platelets with a risk of bleeding.  However if his platelets get above 100 K may consider starting.   -4/27 Vas [place]: Negative for DVT bilateral -4/29 Continue SCD's, while trending Plts -4/29 Plts today 132, up from 73 on 4/27, continue to trend to    ensure stays above 100K -4/30 Plts at 154 today, continue to trend with am labs 5/1- plts 206- will start Lovenox since high risk for DVT and recheck labs Thursday.  5/5 plts 220- continue to monitor 5/6 no new labs but will trend platelets with the q. Monday labs.    Latest Ref Rng & Units 01/09/2022    6:20 AM 01/08/2022    5:22 AM 01/05/2022    6:38 AM  CBC  WBC 4.0 - 10.5 K/uL 14.8   15.6   7.2    Hemoglobin 13.0 - 17.0 g/dL 8.5   8.4   8.1    Hematocrit 39.0 - 52.0 % 25.8   25.5   25.8    Platelets 150 - 400 K/uL 220   211   206       -antiplatelet therapy:N/A 3. Pain Management/Post-Op: Start Tylenol 650 mg 3 times daily.  Oxycodone as needed. 4. Mood: Team to provide a good support.  May need antidepressant-family and patient some disappointed and had multiple questions anxiety with regards to recovery and  interventions potentially seeking second opinion etc.  Discussed with Dr. Johnsie Cancel specially on spinal cord pathology. -01/01/2022 we will start Celexa 20 mg daily for mood. -4/29 Mood appropriate today. - Antipsychotic agents: Not applicable 5. Neuropsych: This patient may be capable of making decisions on his own behalf. 6. Skin/Wound Care: Routine pressure  relief measures.              --Gerhadt's cream to scrotal breakdown            -4/30-Continue use of cream as protective barrier.    7. Fluids/Electrolytes/Nutrition: Routine in and outs with follow-up chemistries -4/29 continue to check Cmet 8.  History of BPH/urinary retention.  Continue to hold Hytrin and Flomax for now. - Monitor BP for any orthostatic changes/drops. -4/30 Pending orthostatic vitals.  Pt voiding adequately s/p Foley removal. 9.  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 10.  ABLA: Monitor H/H for stability. - Improved from 5.7-10.  Recheck CBC in AM. -01/01/22 hemoglobin 9.1. -01/03/22 today hemoglobin has dropped to 7.0 there are no obvious signs of bleeding.  Patient does not report seeing visible blood in stool or urine.  New orders for stool guaiac x3 UA to rule out blood in urine repeat H&H and orthostatic blood pressure to monitor for symptoms. -4/29 U/A negative for blood in urine. -4/30 Hgb up to 7.4 today from 7.0 yesterday, CBC in am to trend. Pending stool guaiac.  5/1- Hb 8.1 so don't know why it dropped so much- con't to monitor 5/4- HGB 8.4 today, appears to be stable, continue to monitor  5/5 hgb stable at 8.5, continue to monitor  5/6 we will continue to trend hemoglobin on q. Monday labs     Latest Ref Rng &  Units 01/09/2022    6:20 AM 01/08/2022    5:22 AM 01/05/2022    6:38 AM  CBC  WBC 4.0 - 10.5 K/uL 14.8   15.6   7.2    Hemoglobin 13.0 - 17.0 g/dL 8.5   8.4   8.1    Hematocrit 39.0 - 52.0 % 25.8   25.5   25.8    Platelets 150 - 400 K/uL 220   211   206      11.  Insomnia: Slept well last night with current trazodone.  Continue as needed.  -4/30 Pt reports that trazodone ineffective for sleep.  Trial of 3 mg melatonin to if this works better for the patient. 5/3- will change to Ambien 5 mg QHS- due ot age, cannot do a higher dose.              5/4 insomnia improved last night             5/5 insomnia continues to be improved, follow            5/6 patient says slept well with current medication of Ambien 5 mg nightly continue this plan. 5/7 Slept okay last night. 12.  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 - Check KUB for constipation/stool burden burden. - 01/01/22-start bowel program q. nightly with digital stimulation with suppository.  Educated patient and son on bowel program. 4/28-has not been eating but has had small bowel movement with the bowel program.   -4/29 has had stool today -4/30 No constipation reported, LBM 4/30 5/1- not clear if had BM/bowel program last night- have called nursing to figure out what's going on with bowel  program             5/2- BM last night with program- will move to q2 days since pt says at home was q2-4 days would have BM- 5/6 patient reports BM 2 days ago which is not uncommon for his normal.  Continue bowel program as prescribed. 5/7 Last BM 5/5 13.  Neurogenic bladder: Had Foley due to urinary retention and low blood pressure -01/01/2022 plans to remove Foley in the morning on 428 and start Flomax 0.4 mg q. nightly because he was having low blood pressure up until now-we will monitor and stop if BP goes low again. -01/02/2022 Foley was removed around lunchtime. -01/03/2022 patient has voided twice since  Foley removal however because patient is incontinent these were recorded as an occurrence instead of a volume.  Patient family and nurse said that he has been able to void without issue since Foley has been removed. -4/30 Patient continues to void but is incontinent r/t neurogenic bladder    5/1- think voiding is overflow- need bladder scans to be clear.     5/2- change bladder scans to q4 hours- and see if can void with urinal?    5/3- voiding incontinently- worried will have more skin breakdown- will stop 5/6 continue to monitor voiding with of every 4 hour bladder scans patient is not reporting issues with voiding today however continue to monitor for possible overflow. 5/7 Voiding okay today, CTM. 14.  Azotemia - 01/01/2022 BUN 38 was 21-we will give IVF's-50 cc/h and recheck in a.m. if not better tomorrow, will not remove Foley. 01/02/2022 BUN down to 33-we will continue IVF's for the rest of the day today and remove Foley this afternoon patient advised to drink more and recheck labs Monday morning 01/05/2022 -4/29 BUN trending down at 33 (4/28) from 38 on 4/27 -4/30 qMonday labs to trend 5/1- BUN down to 24- con't to push fluids 5/4 BUN down to 23, continue to monitor 5/6 BUN at 24 creatinine at 0.81 encourage fluids adequate hydration. 5/7 trend with qMonday labs.     Latest Ref Rng & Units 01/09/2022    6:20 AM 01/08/2022    5:22 AM 01/05/2022    6:38 AM  BMP  Glucose 70 - 99 mg/dL 96   95   99    BUN 8 - 23 mg/dL 24   23   24     Creatinine 0.61 - 1.24 mg/dL 0.10   2.72   5.36    Sodium 135 - 145 mmol/L 134   135   135    Potassium 3.5 - 5.1 mmol/L 3.8   3.8   3.6    Chloride 98 - 111 mmol/L 104   104   103    CO2 22 - 32 mmol/L 23   24   26     Calcium 8.9 - 10.3 mg/dL 8.4   8.3   8.3      15.  Cholelithiasis-  -4/28 no acute cholecystitis 16. LE edema- nonpitting             5/3- will reorder TEDs- thigh highs. For during day.              5/4- OT to provide teds today              5/6 continue to implement TEDs with help from OT 5/7 Worked with PT today, continue use of TED hose. 17.UTI             -  5/5 Keflex was started for treatment             -Follow culture results           5/6 continue Keflex culture results are still pending we will follow up once these are posted. 5/7 Urine culture back, >10,000 E. Coli, pan sensitive.  Since Keflex good for E. Coli will continue current treatment.   LOS: 11 days A FACE TO FACE EVALUATION WAS PERFORMED  Tressia Miners, FNP 01/11/2022, 10:37 AM

## 2022-01-11 NOTE — Progress Notes (Signed)
Occupational Therapy Session Note ? ?Patient Details  ?Name: Allen Arias ?MRN: [id number] ?Date of Birth: 1940/09/30 ? ?Today's Date: 01/11/2022 ?OT Individual Time: [id number] ?OT Individual Time Calculation (min): 30 min  and Today's Date: 01/11/2022 ?OT Missed Time: 30 Minutes ?Missed Time Reason: Other (comment) (eating breakfast) ? ? ?Short Term Goals: ?Week 2:  OT Short Term Goal 1 (Week 2): Pt will compeltes SB transfer to [facility] wiht MOD A ?OT Short Term Goal 2 (Week 2): Pt will maintain circle sitting wiht MIN A in prep for LB dressing for 5 min ?OT Short Term Goal 3 (Week 2): Pt will complete UB dressing with min A seated EOB ?OT Short Term Goal 4 (Week 2): Pt will perform supine>sit EOB with min A in preparation for transfers ? ?Skilled Therapeutic Interventions/Progress Updates:  ?  Pt in bed eating breakfast, which had just been delivered. Pt missed 30 mins skilled OT services at beginning of session to allow pt to eat breakfast while still warm. OT intervention with focus on bed mobility, LB dressing, peri hygiene, and activity tolerance. Pt incontinent of bladder (overflow) and total A for hygiene and donning new brief and pants. Knee high Ted hose donned. Tasks performed at bed level. UB bathing/dressing did not occur 2/2 time constraints. Rolling R/L in bed with min A using bed rails. Pt repositioned in bed with min A. Pt remained in bed with all needs within reach and bed alarm activated.  ? ?Therapy Documentation ?Precautions:  ?Precautions ?Precautions: Fall ?Precaution Comments: foley ?Restrictions ?Weight Bearing Restrictions: No ?General: ?General ?OT Amount of Missed Time: 30 Minutes ?Pain: ? Pt reports some BLE discomfort; repositioned ? ? ?Therapy/Group: Individual Therapy ? ?Leroy Libman ?01/11/2022, 8:02 AM ?

## 2022-01-11 NOTE — Progress Notes (Signed)
Pt informed of bowel program and was agreeable. Patient positioned to L lateral, small amount of stool on anal area. Peri care done. Digital stimulation performed and medium amount of soft, formed stool expelled. Peri care done and pt repositioned.  ?

## 2022-01-12 LAB — COMPREHENSIVE METABOLIC PANEL
ALT: 38 U/L (ref 0–44)
AST: 35 U/L (ref 15–41)
Albumin: 2.8 g/dL — ABNORMAL LOW (ref 3.5–5.0)
Alkaline Phosphatase: 45 U/L (ref 38–126)
Anion gap: 3 — ABNORMAL LOW (ref 5–15)
BUN: 25 mg/dL — ABNORMAL HIGH (ref 8–23)
CO2: 27 mmol/L (ref 22–32)
Calcium: 8.6 mg/dL — ABNORMAL LOW (ref 8.9–10.3)
Chloride: 103 mmol/L (ref 98–111)
Creatinine, Ser: 0.74 mg/dL (ref 0.61–1.24)
GFR, Estimated: >60 mL/min (ref >60)
Glucose, Bld: 96 mg/dL (ref 70–99)
Potassium: 4.1 mmol/L (ref 3.5–5.1)
Sodium: 133 mmol/L — ABNORMAL LOW (ref 135–145)
Total Bilirubin: 2 mg/dL — ABNORMAL HIGH (ref 0.3–1.2)
Total Protein: 5.2 g/dL — ABNORMAL LOW (ref 6.5–8.1)

## 2022-01-12 LAB — CBC
HCT: 28.6 % — ABNORMAL LOW (ref 39.0–52.0)
Hemoglobin: 9.3 g/dL — ABNORMAL LOW (ref 13.0–17.0)
MCH: 32.6 pg (ref 26.0–34.0)
MCHC: 32.5 g/dL (ref 30.0–36.0)
MCV: 100.4 fL — ABNORMAL HIGH (ref 80.0–100.0)
Platelets: 187 10*3/uL (ref 150–400)
RBC: 2.85 MIL/uL — ABNORMAL LOW (ref 4.22–5.81)
RDW: 18.7 % — ABNORMAL HIGH (ref 11.5–15.5)
WBC: 2.4 10*3/uL — ABNORMAL LOW (ref 4.0–10.5)
nRBC: 0 % (ref 0.0–0.2)

## 2022-01-12 NOTE — Progress Notes (Signed)
Occupational Therapy Session Note ? ?Patient Details  ?Name: Allen Arias ?MRN: [id number] ?Date of Birth: [birth year] ? ?Today's Date: 01/12/2022 ?OT Individual Time: [phone] ?OT Individual Time Calculation (min): 72 min  ? ? ?Short Term Goals: ?Week 2:  OT Short Term Goal 1 (Week 2): Pt will compeltes SB transfer to [facility] wiht MOD A ?OT Short Term Goal 2 (Week 2): Pt will maintain circle sitting wiht MIN A in prep for LB dressing for 5 min ?OT Short Term Goal 3 (Week 2): Pt will complete UB dressing with min A seated EOB ?OT Short Term Goal 4 (Week 2): Pt will perform supine>sit EOB with min A in preparation for transfers ? ?Skilled Therapeutic Interventions/Progress Updates:  ?  Pt resting in bed upon arrival. [employer] interpreter used for 1st half of session and then disconnected. OT intervention with focus on LB dressing at bed level, bed mobility, functional tranfsers, sitting balance, UB bathing/dressing seated EOB, and activity tolerance. Pt incontinent of bladder and dependent for toileting tasks at bed level. Tot A for LB dressing at bed level. Supine>sit EOB with min A. Sitting balance initially with min A but fading to close supervision for UB bathing/dressing tasks. SB transfer to w/c with CGA. Pt completed grooming tasks seated in w/c at sink. Pt propelled w/c to ortho gym and completed SB tranfser to EOM with CGA. Sitting balance activity tapping beach ball with 1# bar-3x15 with CGA. Pt tranfserred back to w/c and propelled to room. SB tranfser to EOB. Sit>supine with mod A. Pt remained in bed with all needs within reach and bed alarm activated.  ? ?Therapy Documentation ?Precautions:  ?Precautions ?Precautions: Fall ?Precaution Comments: foley ?Restrictions ?Weight Bearing Restrictions: No ? ?Pain: ? Pt continues to c/o BLE/groin discomfort (unrated); repositioned ? ? ?Therapy/Group: Individual Therapy ? ?[name] ?01/12/2022, 9:21 AM ?

## 2022-01-12 NOTE — Progress Notes (Signed)
Physical Therapy Session Note ? ?Patient Details  ?Name: Kaitlyn Skowron ?MRN: [id number] ?Date of Birth: 1941/01/12 ? ?Today's Date: 01/12/2022 ?PT Individual Time: [id number]; [id number] ?PT Individual Time Calculation (min): 75 min and 62 min ?PT Missed Time: 13 min ?Missed Time Reason: nursing care ? ?Short Term Goals: ?Week 2:  PT Short Term Goal 1 (Week 2): Will be able to maintain dynamic sitting balance with feet supported during reaching activity CGA ?PT Short Term Goal 2 (Week 2): Will be able to demonstrate bed/chair transfer CGA without requiring verbal cuing for hand placement ?PT Short Term Goal 3 (Week 2): Will be able to perform supine <> sit with CGA utilizing leg straps ? ? ?Skilled Therapeutic Interventions/Progress Updates:  ?  Session 1: ?Pt received seated in bed, agreeable to PT session. Utilized [employer] video interpreter for [place] language Young-joo #[id number]. Pt reports some pain at site of groin incisions, not rated and declines intervention. Assisted pt with donning B leg loops at bed level. Supine to sit with min A needed for some LE management with use of leg loops as well as HOB slightly elevated and use of bedrail. Slide board transfers at min A level throughout session to level surface. Verbal cues needed to maintain anterior weight shift, head/hips relationship, and to lift buttocks during transfer rather than sliding along slide board. Manual w/c propulsion to/from therapy gym at Supervision level. Reviewed management of w/c leg rests and arm rests with min A needed for management. Sit to supine on flat mat table with min A needed for some LE management even with use of leg loops. Supine hip stretches in available planes of motion with use of leg loops to assist with stretching. Pt requires min A for placement of limb then able to perform stretch independently. Supine to long-sitting with mod A for trunk control. Pt able to scoot himself anteriorly/posteriorly in long-sitting position on mat  table with min to mod A for trunk control. Pt able to position himself with back against wall in long-sitting position in order to work towards circle sitting position. Pt able to bring RLE into figure-4 position with min A and able to reach LE to don/doff shoe and perform ankle PROM. Attempt to have pt bring LLE into figure-4 position, pt unable to tolerate due to hip tightness and stretching of incision. Pt returned to supine. Pt able to tolerate therapist-assisted PROM of hip and able to tolerate LE being brought into figure-4 position while in supine. Pt returned to long-sitting with mod A for trunk control. Long-sitting to short-sitting EOM with min A for trunk control, use of leg loops for BLE management. Seated balance EOM with CGA performing ball pass front to back 2 x 5 reps each direction. Pt requests to return to bed at end of session. Slide board transfer mat table to w/c then w/c to bed with min A. Sit to supine mod A needed for BLE management due to pt fatigue. Pt left supine in bed with needs in reach. ? ?Session 2: ?Pt received seated in w/c in room, agreeable to PT session. Utilized [employer] interpreter during session, Vernona Rieger [phone] first half of session and Lanora Manis #[id number] 2nd half of session. No complaints of pain this PM. Manual w/c propulsion to/from therapy gym at Supervision level with use of BUE. Slide board transfers with min A during session with cues needed for head/hips relationship during transfer. Sit to stand from elevated mat table to stedy with max A, heavy UE reliance. Sit to stand  from perched position on stedy with mod A, again with heavy UE reliance. Pt reports he feels that UE are assisting 80% and LE 20% with transfer. Seated balance and core strengthening EOM with 1.5 kg weighted ball: punch-outs, L/R diagonals 2 x 15 reps and unweighted ball OH lifts 2 x 15 reps with close Supervision to CGA for sitting balance and cues for upright posture. Pt fatigues quickly with this  activity, requests to return to his room. Pt found to be incontinent of urine in his brief, returned to bed. Sit to supine mod A for BLE management. Rolling L/R with min A and use of bedrails for brief change and pericare. Educated pt on neurogenic bladder and overflow during pericare. Pt with ongoing urine overflow during brief change. Nursing notified that pt needing bladder scan and/or I/O cath. Pt left supine in bed with needs in reach, bed alarm in place. Pt missed 13 min of scheduled therapy session for nursing care for bladder management. ? ?Therapy Documentation ?Precautions:  ?Precautions ?Precautions: Fall ?Precaution Comments: foley ?Restrictions ?Weight Bearing Restrictions: No ? ? ? ? ? ? ?Therapy/Group: Individual Therapy ? ? ?Peter Congo, PT, DPT, CSRS ?01/12/2022, 12:16 PM  ?

## 2022-01-12 NOTE — Progress Notes (Signed)
?  ?  I evaluated patient in rehab.  He unfortunately has minimal motor of his left lower extremity and none of the right lower extremity at this time.  His incisions are all healing well.  I discussed this difficult situation with the patient and his daughter with his daughter facilitating as an interpreter.  They are obviously upset with the outcome which is certainly understandable.  We will continue to follow intermittently while patient is admitted. ? ?Servando Snare, MD ?

## 2022-01-13 NOTE — [place] FL2 (Signed)
?[place] MEDICAID FL2 LEVEL OF CARE SCREENING TOOL  ?  ? ?IDENTIFICATION  ?Patient Name: ?Allen Arias Birthdate: Nov 25, 1940 Sex: male Admission Date (Current Location): ?12/31/2021  ?[place] Number: ? [place] ?[id number] T Facility and Address:  ?[facility] [place], [address], [place], [place] [address] ?     Provider Number: ?[id number]  ?Attending Physician Name and Address:  ?Genice Rouge, MD ? Relative Name and Phone Number:  ?Armanie Martine (dtr) [phone] ?   ?Current Level of Care: ?Hospital Recommended Level of Care: ?Nursing Facility Prior Approval Number: ?  ? ?Date Approved/Denied: ?  PASRR Number: ?[id number] A ? ?Discharge Plan: ?SNF ?  ? ?Current Diagnoses: ?Patient Active Problem List  ? Diagnosis Date Noted  ? 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 AAA (abdominal aortic aneurysm) (HCC) 12/26/2021  ? Abdominal aortic aneurysm (AAA) greater than 5.5 cm in diameter in male [facility]) 12/26/2021  ? Status post endovascular aneurysm repair (EVAR)   ? Weakness of both lower extremities   ? Hyponatremia 02/03/2020  ? Hyperbilirubinemia 02/03/2020  ? Essential hypertension 02/03/2020  ? Purpura (HCC) 02/03/2020  ? Dehydration 02/03/2020  ? ? ?Orientation RESPIRATION BLADDER Height & Weight   ?  ?Self, Time, Situation, Place ? Normal Incontinent (bladder scanning) Weight: 109 lb 12.6 oz (49.8 kg) ?Height:  5\' 4"  (162.6 cm)  ?BEHAVIORAL SYMPTOMS/MOOD NEUROLOGICAL BOWEL NUTRITION STATUS  ?    Continent Diet (regular diet/thin liquids; meds with applesauce)  ?AMBULATORY STATUS COMMUNICATION OF NEEDS Skin   ?Extensive Assist Verbally Other (Comment) (MASD to scrotum/bottom- barrier cream) ?  ?  ?  ?    ?     ?     ? ? ?Personal Care Assistance Level of Assistance  ?Bathing, Dressing Bathing Assistance: Limited assistance ?  ?Dressing Assistance: Limited assistance ?   ? ?Functional Limitations Info  ?Sight, Hearing, Speech Sight Info: Adequate ?Hearing Info:  Adequate ?Speech Info: Adequate  ? ? ?SPECIAL CARE FACTORS FREQUENCY  ?PT (By licensed PT), OT (By licensed OT)   ?  ?PT Frequency: (S) 5xs per week ?OT Frequency: 5xs per week ?  ?  ?  ?   ? ? ?Contractures Contractures Info: Not present  ? ? ?Additional Factors Info  ?Code Status, Allergies Code Status Info: Full ?Allergies Info: NKA ?  ?  ?  ?   ? ?Current Medications (01/13/2022):  This is the current hospital active medication list ?Current Facility-Administered Medications  ?Medication Dose Route Frequency Provider Last Rate Last Admin  ? acetaminophen (TYLENOL) tablet 650 mg  650 mg Oral TID 03/15/2022, MD   650 mg at 01/13/22 0829  ? alum & mag hydroxide-simeth (MAALOX/MYLANTA) 200-200-20 MG/5ML suspension 30 mL  30 mL Oral Q4H PRN Love, Pamela S, PA-C      ? bisacodyl (DULCOLAX) suppository 10 mg  10 mg Rectal QODAY Love09-29-1985, PA-C   10 mg at 01/11/22 1705  ? cephALEXin (KEFLEX) capsule 250 mg  250 mg Oral TID 03/13/22, PA-C   250 mg at 01/13/22 03/15/22  ? childrens multivitamin chewable tablet 1 tablet  1 tablet Oral BID 5631, PA-C   1 tablet at 01/13/22 03/15/22  ? cholecalciferol (VITAMIN D3) tablet 1,000 Units  1,000 Units Oral Daily 4970, MD   1,000 Units at 01/13/22 0829  ? citalopram (CELEXA) tablet 20 mg  20 mg Oral Daily Lovorn, Megan, MD   20 mg at 01/13/22 0829  ?  diphenhydrAMINE (BENADRYL) 12.5 MG/5ML elixir 12.5-25 mg  12.5-25 mg Oral Q6H PRN Love, Pamela S, PA-C      ? enoxaparin (LOVENOX) injection 40 mg  40 mg Subcutaneous Q24H Lovorn, Megan, MD   40 mg at 01/13/22 1148  ? feeding supplement (ENSURE ENLIVE / ENSURE PLUS) liquid 237 mL  237 mL Oral TID with meals Jacquelynn Cree, PA-C   237 mL at 01/13/22 1148  ? Gerhardt's butt cream   Topical PRN Lovorn, Aundra Millet, MD   1 application. at 01/02/22 1804  ? guaiFENesin-dextromethorphan (ROBITUSSIN DM) 100-10 MG/5ML syrup 5-10 mL  5-10 mL Oral Q6H PRN Love, Pamela S, PA-C      ? lidocaine (XYLOCAINE) 2 % jelly   Topical  PRN Love, Evlyn Kanner, PA-C   6 application. at 01/03/22 1509  ? melatonin tablet 3 mg  3 mg Oral QHS Tressia Miners, FNP   3 mg at 01/12/22 2114  ? oxyCODONE-acetaminophen (PERCOCET/ROXICET) 5-325 MG per tablet 1-2 tablet  1-2 tablet Oral Q4H PRN Love, Pamela S, PA-C      ? phenol (CHLORASEPTIC) mouth spray 1 spray  1 spray Mouth/Throat PRN Love, Pamela S, PA-C      ? polycarbophil (FIBERCON) tablet 625 mg  625 mg Oral BID Jacquelynn Cree, PA-C   625 mg at 01/13/22 0938  ? polyethylene glycol (MIRALAX / GLYCOLAX) packet 17 g  17 g Oral Daily PRN Love, Pamela S, PA-C      ? prochlorperazine (COMPAZINE) tablet 5-10 mg  5-10 mg Oral Q6H PRN Love, Evlyn Kanner, PA-C      ? Or  ? prochlorperazine (COMPAZINE) injection 5-10 mg  5-10 mg Intramuscular Q6H PRN Love, Evlyn Kanner, PA-C      ? Or  ? prochlorperazine (COMPAZINE) suppository 12.5 mg  12.5 mg Rectal Q6H PRN Love, Pamela S, PA-C      ? simvastatin (ZOCOR) tablet 20 mg  20 mg Oral QHS Love, Pamela S, PA-C   20 mg at 01/12/22 2019  ? sodium phosphate (FLEET) 7-19 GM/118ML enema 1 enema  1 enema Rectal Once PRN Love, Pamela S, PA-C      ? zolpidem (AMBIEN) tablet 5 mg  5 mg Oral QHS Lovorn, Megan, MD   5 mg at 01/12/22 2114  ? ? ? ?Discharge Medications: ?Please see discharge summary for a list of discharge medications. ? ?Relevant Imaging Results: ? ?Relevant Lab Results: ? ? ?Additional Information ?SS#[id number] ? ?Gretchen Short, LCSW ? ? ? ? ?

## 2022-01-13 NOTE — Progress Notes (Signed)
Occupational Therapy Session Note ? ?Patient Details  ?Name: Allen Arias ?MRN: [id number] ?Date of Birth: 1941/07/21 ? ?Today's Date: 01/13/2022 ?OT Individual Time: 5284-1324 ?OT Individual Time Calculation (min): 70 min  ? ? ?Short Term Goals: ?Week 2:  OT Short Term Goal 1 (Week 2): Pt will compeltes SB transfer to [facility] wiht MOD A ?OT Short Term Goal 2 (Week 2): Pt will maintain circle sitting wiht MIN A in prep for LB dressing for 5 min ?OT Short Term Goal 3 (Week 2): Pt will complete UB dressing with min A seated EOB ?OT Short Term Goal 4 (Week 2): Pt will perform supine>sit EOB with min A in preparation for transfers ? ?Skilled Therapeutic Interventions/Progress Updates:  ?  Stratus interpreter Unjiu [id number].) OT intervention with focus on bed monbility, sitting balance, SB tranfsers, UB bathing/dressing, w/c mobility, and activity tolerance to increase independence with BADLs. LB bathing/dressing at bed level with tot A. Supine>sit EOB with mod A. Sitting balance EOB with supervision.SB transfer to w/c with min A. Pt completed UB bathing/dressing and grooming tasks at w/c level. Pt propelled w/c to gym and transferred to EOM. Dynamic sitting balance with focus on lateral leans and anterior/posterior leans. Pt performed modified crunches with wedge at back 3x8. Lateral leans with superviison. Anterior leans with min A. Pt unable to rest forearms on upper LE. Pt returned to w/c and propelled w/c to room. Pt elected to remain in w/c. All needs within reach. ? ?Therapy Documentation ?Precautions:  ?Precautions ?Precautions: Fall ?Precaution Comments: foley ?Restrictions ?Weight Bearing Restrictions: No ? ?Pain: ?Pt c/o BLE/groin discomfort; meds admin during therapy ? ? ?Therapy/Group: Individual Therapy ? ?[name] ?01/13/2022, 9:29 AM ?

## 2022-01-13 NOTE — Progress Notes (Signed)
Slept well last night. Remains incontinent of bowel and bladder. Bladder scan Q4 hourly continued. Denies any pain. Bilateral groin incision intact. No sign of infection noted. VS stable. Remains alert and oriented. Safety maintained at all times. ?

## 2022-01-13 NOTE — Progress Notes (Addendum)
Patient ID: Allen Arias, male   DOB: [birth year], 81 y.o.   MRN: [id number] ? ?SW called pt dtr [name] to give updates from team conference and discuss discharge plan. Reports she would still like for him to stay here longer. SW provided updates from team that recommendation continues to be SNF given that he is making slow progress and will need a longer recovery period. Dtr confirms there will not be any support at home. SW reiterated SNF placement due to no change in d/c plan and he will continue to require support. She intends to speak with her husband, and will review SNF list provided.  ? ?NCPASRR#:  [id number] A; SNF referral sent out. ? ?*SW received phone call from pt SIL [name] to discuss above. Reports they will begin to explore SNF locations. ? ?Cecile Sheerer, MSW, LCSWA ?Office: [phone] ?Cell: [phone] ?Fax: [phone]  ?

## 2022-01-13 NOTE — Progress Notes (Signed)
?                                                       PROGRESS NOTE ? ? ?Subjective/Complaints: ? ?Pt reports "feeling great" this AM.  ?LBM 5/7 with bowel program- ?Incontinent of B/B- hasn't required in/out caths since weekend- just incontinent. Volumes never get to 300cc/or at least only rarely.  ?Pt said ate well- usually eats 40-50% of tray.  ? ?Asking about UTI if gone yet- went over chart- was dx'd due to leukocytosis, no Sx's at the time- on keflex until Thursday.  ? ? ? ?[id number] by language  ? ?Objective: ?  ?No results found. ? ?Recent Labs  ?  01/12/22 ?[id number]  ?WBC 2.4*  ?HGB 9.3*  ?HCT 28.6*  ?PLT 187  ? ?Recent Labs  ?  01/12/22 ?[id number]  ?NA 133*  ?K 4.1  ?CL 103  ?CO2 27  ?GLUCOSE 96  ?BUN 25*  ?CREATININE 0.74  ?CALCIUM 8.6*  ? ? ?Intake/Output Summary (Last 24 hours) at 01/13/2022 0810 ?Last data filed at 01/13/2022 0808 ?Gross per 24 hour  ?Intake 656 ml  ?Output --  ?Net 656 ml  ?  ? ?  ? ?Physical Exam: ? ? ?General: awake, alert, appropriate, sitting up in bed; using tele-interpretor to interact; NAD ?HENT: conjugate gaze; oropharynx moist ?CV: regular rate; no JVD ?Pulmonary: CTA B/L; no W/R/R- good air movement ?GI: soft, NT, ND, (+)BS ?Psychiatric: appropriate ?Neurological: Ox3; no increased tone at this time ?GU: Incontinence of urine, wearing brief. No change ?Neurological: Alert son in room interpreting. ?Musc: No edema. Still 1/5 in RLE; 2 to 2-/5 in LLE- slightly bette rin LLE ?Extremities nonpitting 1-2+ LE edema up to thighs bilateral. ?Skin: Bilateral groin incision, flat, non-tender, non-swollen, skin glue, RLE incision: flat, non-tender, non-swollen, skin glue ?Psychiatric appropriate-flattened affect ? ?Physical exam unchanged since 5/6 ? ?Vital Signs ?Blood pressure (!) 145/73, pulse 75, temperature 98.7 ?F (37.1 ?C), resp. rate 15, height 5\' 4"  (1.626 m), weight 49.8 kg, SpO2 98 %. ? ? ? ?Assessment/Plan: ?1. Functional deficits which require 3+ hours per day of interdisciplinary  therapy in a comprehensive inpatient rehab setting. ?Physiatrist is providing close team supervision and 24 hour management of active medical problems listed below. ?Physiatrist and rehab team continue to assess barriers to discharge/monitor patient progress toward functional and medical goals ? ?Care Tool: ? ?Bathing ?   ?Body parts bathed by patient: Right arm, Left arm, Chest, Abdomen, Right upper leg, Left upper leg, Face  ? Body parts bathed by helper: Buttocks, Right lower leg, Left lower leg ?Body parts n/a: Front perineal area ?  ?Bathing assist Assist Level: Moderate Assistance - Patient 50 - 74% ?  ?  ?Upper Body Dressing/Undressing ?Upper body dressing   ?What is the patient wearing?: Pull over shirt ?   ?Upper body assist Assist Level: Contact Guard/Touching assist ?   ?Lower Body Dressing/Undressing ?Lower body dressing ? ? ?   ?What is the patient wearing?: Pants, Incontinence brief ? ?  ? ?Lower body assist Assist for lower body dressing: Total Assistance - Patient < 25% ?   ? ?Toileting ?Toileting    ?Toileting assist Assist for toileting: Total Assistance - Patient < 25% ?  ?  ?Transfers ?Chair/bed transfer ? ?Transfers assist ?   ? ?Chair/bed transfer assist  level: Minimal Assistance - Patient > 75% ?  ?  ?Locomotion ?Ambulation ? ? ?Ambulation assist ? ? Ambulation activity did not occur: Safety/medical concerns ? ?  ?  ?   ? ?Walk 10 feet activity ? ? ?Assist ? Walk 10 feet activity did not occur: Safety/medical concerns ? ?  ?   ? ?Walk 50 feet activity ? ? ?Assist Walk 50 feet with 2 turns activity did not occur: Safety/medical concerns ? ?  ?   ? ? ?Walk 150 feet activity ? ? ?Assist Walk 150 feet activity did not occur: Safety/medical concerns ? ?  ?  ?  ? ?Walk 10 feet on uneven surface  ?activity ? ? ?Assist Walk 10 feet on uneven surfaces activity did not occur: Safety/medical concerns ? ? ?  ?   ? ?Wheelchair ? ? ? ? ?Assist Is the patient using a wheelchair?: Yes ?Type of Wheelchair:  Manual ?  ? ?Wheelchair assist level: Supervision/Verbal cueing ?Max wheelchair distance: 155ft  ? ? ?Wheelchair 50 feet with 2 turns activity ? ? ? ?Assist ? ?  ?  ? ? ?Assist Level: Supervision/Verbal cueing  ? ?Wheelchair 150 feet activity  ? ? ? ?Assist ?   ? ? ?Assist Level: Dependent - Patient 0%  ? ?Blood pressure (!) 145/73, pulse 75, temperature 98.7 ?F (37.1 ?C), resp. rate 15, height 5\' 4"  (1.626 m), weight 49.8 kg, SpO2 98 %. ? ?Medical Problem List and Plan: ?1. 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 ?            - Patient may shower but incisions must be covered. ?             -ELOS/Goals: Min 10 to 14 days ? D/c 5/11 if going to SNF- 5/25 if going home, but doesn't have 24/7 at home. Will discuss more in team conference today- ? Continue CIR- PT, OT  ?2.  Antithrombotics: ?-DVT/anticoagulation: Mechanical: Sequential compression devices, below knee bilateral lower extremities ?- Check Dopplers in a.m. ?-01/01/2022 cannot be on Lovenox or Eliquis due to having low platelets with a risk of bleeding.  However if his platelets get above 100 K may consider starting.   ?-4/27 Vas [place]: Negative for DVT bilateral ?-4/29 Continue SCD's, while trending Plts ?-4/29 Plts today 132, up from 73 on 4/27, continue to trend to    ensure stays above 100K ?-4/30 Plts at 154 today, continue to trend with am labs ?5/1- plts 206- will start Lovenox since high risk for DVT and recheck labs Thursday.  ?5/5 plts 220- continue to monitor ?5/6 no new labs but will trend platelets with the q. Monday labs. ? 5/9- plts 187k- con't lovenox ? ?  Latest Ref Rng & Units 01/12/2022  ?  5:31 AM 01/09/2022  ?  6:20 AM 01/08/2022  ?  5:22 AM  ?CBC  ?WBC 4.0 - 10.5 K/uL 2.4   14.8   15.6    ?Hemoglobin 13.0 - 17.0 g/dL 9.3   8.5   8.4    ?Hematocrit 39.0 - 52.0 % 28.6   25.8   25.5    ?Platelets 150 - 400 K/uL 187   220   211    ?  ? -antiplatelet therapy:N/A ?3. Pain Management/Post-Op: Start Tylenol 650 mg 3 times daily.   Oxycodone as needed. ?4. Mood: Team to provide a good support.  May need antidepressant-family and patient some disappointed and had multiple questions anxiety with regards to recovery and interventions  potentially seeking second opinion etc.  Discussed with Dr. Willaim Sheng specially on spinal cord pathology. ?-01/01/2022 we will start Celexa 20 mg daily for mood. ?-4/29 Mood appropriate today. ?- Antipsychotic agents: Not applicable ?5. Neuropsych: This patient may be capable of making decisions on his own behalf. ?6. Skin/Wound Care: Routine pressure relief measures.  ?            --Gerhadt's cream to scrotal breakdown ?           -4/30-Continue use of cream as protective barrier. ?  ? ?7. Fluids/Electrolytes/Nutrition: Routine in and outs with follow-up chemistries ?8.  History of BPH/urinary retention.  Continue to hold Hytrin and Flomax for now. ?- Monitor BP for any orthostatic changes/drops. ?-4/30 Pending orthostatic vitals.  Pt voiding adequately s/p Foley removal. ?9.  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     -Has varied from 100 down to 44 down to 56 ?-4/29 Plts today 132, up from 73 on 4/27, continue to trend to    ensure stays above 100K ?-4/30 Plts at 154 today, continue to trend with am labs ?5/1- Plts 206- start Lovenox ?5/6 patient to continue Lovenox no bleeding issues. ? 5/9- Plts 187k- doing well- cont' lovneox ?10.  ABLA: Monitor H/H for stability. ?- Improved from 5.7-10.  Recheck CBC in AM. ?-01/01/22 hemoglobin 9.1. ?-01/03/22 today hemoglobin has dropped to 7.0 there are no obvious signs of bleeding.  Patient does not report seeing visible blood in stool or urine.  New orders for stool guaiac x3 UA to rule out blood in urine repeat H&H and orthostatic blood pressure to monitor for symptoms. ?-4/29 U/A negative for blood in urine. ?-4/30 Hgb up to 7.4 today from 7.0 yesterday, CBC in am to trend. Pending stool guaiac.  ?5/1- Hb 8.1 so don't know why it dropped so much- con't to  monitor ?5/4- HGB 8.4 today, appears to be stable, continue to monitor ? 5/5 hgb stable at 8.5, continue to monitor ? 5/6 we will continue to trend hemoglobin on q. Monday labs ?5/9- Hb up to 9.3- con

## 2022-01-13 NOTE — Patient Care Conference (Signed)
Inpatient RehabilitationTeam Conference and Plan of Care Update ?Date: 01/13/2022   Time: 11:03 AM  ? ? ?Patient Name: Allen Arias      ?Medical Record Number: [id number]  ?Date of Birth: 11/12/1940 ?Sex: Male         ?Room/Bed: 4M07C/4M07C-01 ?Payor Info: Payor: MEDICARE / Plan: MEDICARE PART A AND B / Product Type: *No Product type* /   ? ?Admit Date/Time:  12/31/2021  3:57 PM ? 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 ?Hospital Problems: Principal Problem: ?  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 ? ? ?Expected Discharge Date: Expected Discharge Date: 01/29/22 (or 2 weeks if going to SNF.) ? ?Team Members Present: ?Physician leading conference: Dr. Genice Rouge ?Social Worker Present: Cecile Sheerer, LCSWA ?Nurse Present: Kennyth Arnold, RN ?PT Present: Peter Congo, PT ?OT Present: Ardis Rowan, Jaynee Eagles, OT ?PPS Coordinator present : Fae Pippin, SLP ? ?   Current Status/Progress Goal Weekly Team Focus  ?Bowel/Bladder ? ? Incontinent of B/B. LBM 01/11/22  Regain continence.  Follow bladder and bowel program as ordered.   ?Swallow/Nutrition/ Hydration ? ?           ?ADL's ? ? UB bathing/dressing-CGA; LB bathing/dressing-tot A: SB tranfsers-CGA/min A  min A overall; mod a LB dressing and toileting  education, BADL retraining, safety awareness, activity tolerance   ?Mobility ? ? min to mod A bed mobility, CGA to min A SB transfer, Supervision w/c mobility (needs assist managing w/c parts), dependent to stand with equipment  minA overall, mod I w/c mobility  transfers, sitting balance, w/c mobility and management, SCI edu   ?Communication ? ?           ?Safety/Cognition/ Behavioral Observations ?           ?Pain ? ? No complains of pain.  Remain pain free  Assess Q shift   ?Skin ? ? MASD to scrotum, Bilaterial groin and Lt leg incision.  Prevent New skin breakdown.  Assess q shift and prn.   ? ? ?Discharge Planning:  ?Pt is likely going to be SNF placement because dtr  only able to provide intermittent support, and pt wife has dementia in which he was primary caregiver. D/c plan is still unclear at this time. Family has been informed on SNF placement process, and encouraged to apply for LTC MCD.   ?Team Discussion: ?Bowel program doing well. BUN 25, dehydrated. Nursing documentation notes successful bowel program. Remains incontinent B/B, scheduled Tylenol. Incisions OTA, barrier cream for MASD. Waiting on decision of SNF placement. ? ?Patient on target to meet rehab goals: ?yes, min assist goals, mod I WC. Currently CGA/min assist using leg loops in bed. Supervision WC management. Max assist to dependent with steady. Lower body bathing and dressing at bed level. EOB or WC upper body with CGA/supervision assist. Working on Curator. Working on sitting balance. ? ?*See Care Plan and progress notes for long and short-term goals.  ? ?Revisions to Treatment Plan:  ?Adjusting medications. ?  ?Teaching Needs: ?Family education, medication management, bowel/bladder management, skin/wound care, transfer training, etc. ?  ?Current Barriers to Discharge: ?Decreased caregiver support, Home enviroment access/layout, Incontinence, Neurogenic bowel and bladder, Wound care, Lack of/limited family support, and Weight bearing restrictions ? ?Possible Resolutions to Barriers: ?Family education ?Decision on SNF placement ? ?  ? ? 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 Barriers to Discharge: Decreased family/caregiver support;Home enviroment access/layout;Incontinence;Neurogenic Bowel &  Bladder;Medical stability;Weight;Weight bearing restrictions;Wound care ? Barriers to Discharge Comments: family cannot care for him- but doesn't want him at nursing home ?Possible Resolutions to [employer]: Careers adviser d/w pt/daughter- CGA-min A bed; SB min A- help with w/c- SCI education-Working on training gut/for neurogenic  bowel- incontinent of blkadder- doesn't want Myrbetriq since would retain; doesn't want IVFs for high BUN- wants ot push fluids- hips real tight- not spasticity- d/c 5/11 for SNF- vs 5/25 if family can take home- will need to d/w daughter? Don't want to hear message of level of function ? ? ?Continued Need for Acute Rehabilitation Level of Care: The patient requires daily medical management by a physician with specialized training in physical medicine and rehabilitation for the following reasons: ?Direction of a multidisciplinary physical rehabilitation program to maximize functional independence : Yes ?Medical management of patient stability for increased activity during participation in an intensive rehabilitation regime.: Yes ?Analysis of laboratory values and/or radiology reports with any subsequent need for medication adjustment and/or medical intervention. : Yes ? ? ?I attest that I was present, lead the team conference, and concur with the assessment and plan of the team. ? ? ?Kennyth Arnold G ?01/13/2022, 3:12 PM  ? ? ? ? ? ? ?

## 2022-01-13 NOTE — Progress Notes (Signed)
Physical Therapy Session Note ? ?Patient Details  ?Name: Allen Arias ?MRN: [id number] ?Date of Birth: [birth year] ? ?Today's Date: 01/13/2022 ?PT Individual Time: 7262-0355; 9741-6384 ?PT Individual Time Calculation (min): 70 min and 75 min ? ?Short Term Goals: ?Week 2:  PT Short Term Goal 1 (Week 2): Will be able to maintain dynamic sitting balance with feet supported during reaching activity CGA ?PT Short Term Goal 2 (Week 2): Will be able to demonstrate bed/chair transfer CGA without requiring verbal cuing for hand placement ?PT Short Term Goal 3 (Week 2): Will be able to perform supine <> sit with CGA utilizing leg straps ? ?Skilled Therapeutic Interventions/Progress Updates:  ?  Session 1: ?Pt received seated in w/c in room, agreeable to PT session. No complaints of pain. Manual w/c propulsion 2 x 100 ft with use of BUE at Supervision level throughout session. Assisted pt with donning B leg loops while seated in w/c. Pt is min A for managing w/c leg rests with use of leg loops, some fear of falling with anterior leaning required to reach leg rests. Slide board transfer w/c to mat table with min A, cues for safe sequencing of transfer and head/hips relationship. Scooting L/R on mat table with CGA for balance with cues for clearing buttocks with lift and for LE management with use of leg lifters. Pt exhibits improved ability to scoot to the L vs the R due to ongoing RLE weakness>LLE. Progression to slide board transfers R and L w/c to/from mat table with focus on clearing buttocks during transfer with CGA to min A needed for transfer. Pt does continue to require cues for head/hips relationship during transfer and for clearance of buttocks during transfer. Manual w/c navigation through obstacle course of cones with min cueing for obstacle avoidance. Pt requests to return to bed at end of session due to fatigue and nursing requesting pt transfer to bed for bladder scan. Slide board transfer w/c to bed with min A. Sit to  supine with mod A needed for BLE management due to fatigue. Pt left supine in bed with needs in reach, bed alarm in place. Utilized [employer] interpreter Allen Arias [phone] for [place] language during session. ? ?Session 2: ?Pt received seated in bed, agreeable to PT session. No complaints of pain other than at incision site. Utilized [employer] video interpreter Allen Arias [phone] during session. Pt found to be incontinent of urine in his brief. Rolling L/R with min A for dependent pericare and brief change. Assisted pt with donning new pants at bed level as well as leg loops and shoes. Supine to sit with CGA with increased time needed for management of BLE via leg loops, HOB elevated and use of bedrails. Slide board transfer bed to w/c with CGA to the L. Manual w/c propulsion x 100 ft with use of BUE at Supervision level. Reviewed pressure relief techniques including w/c pushups and L/R lateral leans onto mat table. Pt is able to perform a w/c push-up and clear buttocks but it requires quite a bit of exertion from him. Pt able to perform L/R lateral leans with CGA to min A for trunk control and to return to midline. Seated anterior leans reaching hands down towards 2nd strap of leg loop just below knee with min A for trunk control and therapist seated in front of patient due to fear of falling. Once in anterior lean pt able to move hands from leg to w/c leg rests and back to LE. Pt able to return to midline with  CGA. Pt will benefit from ongoing practice with pressure relief techniques. Pt requests to return to bed at end of session due to fatigue. Slide board transfer w/c to bed with min A. Sit to supine mod A needed for BLE management due to fatigue. Pt left supine in bed with needs in reach, bed alarm in place at end of session. ? ?Therapy Documentation ?Precautions:  ?Precautions ?Precautions: Fall ?Precaution Comments: foley ?Restrictions ?Weight Bearing Restrictions: No ? ? ? ? ?Therapy/Group: Individual  Therapy ? ? ?Peter Congo, PT, DPT, CSRS ?01/13/2022, 12:19 PM  ?

## 2022-01-14 NOTE — Plan of Care (Signed)
?  Problem: Consults ?Goal: RH STROKE PATIENT EDUCATION ?Description: See Patient Education module for education specifics  ?Outcome: Progressing ?  ?Problem: RH BOWEL ELIMINATION ?Goal: RH STG MANAGE BOWEL WITH ASSISTANCE ?Description: STG Manage Bowel with [place]. ?Outcome: Progressing ?Goal: RH STG MANAGE BOWEL W/MEDICATION W/ASSISTANCE ?Description: STG Manage Bowel with Medication with [place]. ?Outcome: Progressing ?  ?Problem: RH BLADDER ELIMINATION ?Goal: RH STG MANAGE BLADDER WITH ASSISTANCE ?Description: STG Manage Bladder With Min Assistance ?Outcome: Progressing ?Goal: RH STG MANAGE BLADDER WITH MEDICATION WITH ASSISTANCE ?Description: STG Manage Bladder With Medication With [place]. ?Outcome: Progressing ?  ?Problem: RH SKIN INTEGRITY ?Goal: RH STG MAINTAIN SKIN INTEGRITY WITH ASSISTANCE ?Description: STG Maintain Skin Integrity With [employer]. ?Outcome: Progressing ?Goal: RH STG ABLE TO PERFORM INCISION/WOUND CARE W/ASSISTANCE ?Description: STG Able To Perform Incision/Wound Care With [facility]. ?Outcome: Progressing ?  ?Problem: RH SAFETY ?Goal: RH STG ADHERE TO SAFETY PRECAUTIONS W/ASSISTANCE/DEVICE ?Description: STG Adhere to Safety Precautions With Cues and Reminders. ?Outcome: Progressing ?Goal: RH STG DECREASED RISK OF FALL WITH ASSISTANCE ?Description: STG Decreased Risk of Fall With [employer]. ?Outcome: Progressing ?  ?

## 2022-01-14 NOTE — Progress Notes (Signed)
Physical Therapy Session Note ? ?Patient Details  ?Name: Allen Arias ?MRN: [id number] ?Date of Birth: 1941-04-05 ? ?Today's Date: 01/14/2022 ?PT Individual Time: 1015-1110 ?PT Individual Time Calculation (min): 55 min  ? ?Short Term Goals: ?Week 2:  PT Short Term Goal 1 (Week 2): Will be able to maintain dynamic sitting balance with feet supported during reaching activity CGA ?PT Short Term Goal 2 (Week 2): Will be able to demonstrate bed/chair transfer CGA without requiring verbal cuing for hand placement ?PT Short Term Goal 3 (Week 2): Will be able to perform supine <> sit with CGA utilizing leg straps ? ?Skilled Therapeutic Interventions/Progress Updates:  ?  Pt received seated in bed, agreeable to PT session. No complaints of pain. Assisted pt with donning leg loops at bed level. Semi-reclined in bed to sitting EOB with CGA with use of bedrail and leg loops. Slide board transfers with CGA to min A during session, cues for correct transfer technique. Manual w/c propulsion 2 x 100 ft with use of BUE at Supervision level. Sitting EOM to long-sitting with CGA for trunk control, use of BLE leg loops to assist with LE management. Session focus on sitting balance in long-sitting position. Pt able to maintain upright trunk with assist from BLE on leg loops, otherwise unable to maintain position independently. Attempt to have pt perform long-sitting ball toss/ball roll but unable to let go with UE for support and maintain balance. Semi-reclined to long-sit mini-crunches 3 x 10 reps with min to mod A with no UE support to assist. Pt reports ongoing muscle tightness in L hip that is stretched in long-sitting position but pt cannot tolerate hip angle greater than 90 degrees. Pt requests to return to bed at end of session. Sit to supine mod A needed for LE management due to fatigue. Pt left seated in bed with needs in reach, bed alarm in place. Utilized [employer] interpreter Lanora Manis [phone] during session for [place]  language. ? ?Therapy Documentation ?Precautions:  ?Precautions ?Precautions: Fall ?Precaution Comments: foley ?Restrictions ?Weight Bearing Restrictions: No ? ? ? ? ? ? ?Therapy/Group: Individual Therapy ? ? ?Peter Congo, PT, DPT, CSRS ?01/14/2022, 12:23 PM u ?

## 2022-01-14 NOTE — Progress Notes (Signed)
Slept well last night. Had bowel program done yesterday with digital stimulation. Had bowel movement (small to medium stool). Continues to be incontinent. Bladder scans less than 300 post void. Safety maintained. ?

## 2022-01-14 NOTE — Progress Notes (Signed)
?                                                       PROGRESS NOTE ? ? ?Subjective/Complaints: ? ?Pt reports not the best- feeling sad about lack of recovery. But OK.  ?Slept well o/n ? ?Had bowel program- didn't know did have a documented BM with program last night.  ? ?Still concerned about LLE swelling which won't improve- they are having him wear TEDs  ? ?[id number] by language ? ?Objective: ?  ?No results found. ? ?Recent Labs  ?  01/12/22 ?8003  ?WBC 2.4*  ?HGB 9.3*  ?HCT 28.6*  ?PLT 187  ? ?Recent Labs  ?  01/12/22 ?4917  ?NA 133*  ?K 4.1  ?CL 103  ?CO2 27  ?GLUCOSE 96  ?BUN 25*  ?CREATININE 0.74  ?CALCIUM 8.6*  ? ? ?Intake/Output Summary (Last 24 hours) at 01/14/2022 0831 ?Last data filed at 01/14/2022 0500 ?Gross per 24 hour  ?Intake 195 ml  ?Output 3 ml  ?Net 192 ml  ?  ? ?  ? ?Physical Exam: ? ? ? ?General: awake, alert, appropriate, sitting up in bed; tele-interpretor online- also spoke with daughter on phone; NAD ?HENT: conjugate gaze; oropharynx moist ?CV: regular rate; no JVD ?Pulmonary: CTA B/L; no W/R/R- good air movement ?GI: soft, NT, ND, (+)BS ?Psychiatric: appropriate- flat affect ?Neurological: alert- no increased tone ?GU: Incontinence of urine, wearing brief. No change ?Musc: No edema. Still 1/5 in RLE; 2 to 2-/5 in LLE- slightly bette rin LLE ?Extremities pitting LLE edema 1-2+ to L knee- trace on RLE ?Skin: Bilateral groin incision, flat, non-tender, non-swollen, skin glue, RLE incision: flat, non-tender, non-swollen, skin glue- stable ?Psychiatric appropriate-flattened affect ? ?Physical exam unchanged since 5/6 ? ?Vital Signs ?Blood pressure 132/67, pulse 70, temperature 97.7 ?F (36.5 ?C), temperature source Oral, resp. rate 15, height 5\' 4"  (1.626 m), weight 49.8 kg, SpO2 99 %. ? ? ? ?Assessment/Plan: ?1. Functional deficits which require 3+ hours per day of interdisciplinary therapy in a comprehensive inpatient rehab setting. ?Physiatrist is providing close team supervision and 24  hour management of active medical problems listed below. ?Physiatrist and rehab team continue to assess barriers to discharge/monitor patient progress toward functional and medical goals ? ?Care Tool: ? ?Bathing ?   ?Body parts bathed by patient: Right arm, Left arm, Chest, Abdomen, Front perineal area, Right upper leg, Left upper leg, Face  ? Body parts bathed by helper: Buttocks, Right lower leg, Left lower leg ?Body parts n/a: Front perineal area ?  ?Bathing assist Assist Level: Moderate Assistance - Patient 50 - 74% ?  ?  ?Upper Body Dressing/Undressing ?Upper body dressing   ?What is the patient wearing?: Pull over shirt ?   ?Upper body assist Assist Level: Supervision/Verbal cueing ?   ?Lower Body Dressing/Undressing ?Lower body dressing ? ? ?   ?What is the patient wearing?: Pants, Incontinence brief ? ?  ? ?Lower body assist Assist for lower body dressing: Maximal Assistance - Patient 25 - 49% ?   ? ?Toileting ?Toileting    ?Toileting assist Assist for toileting: Dependent - Patient 0% ?  ?  ?Transfers ?Chair/bed transfer ? ?Transfers assist ?   ? ?Chair/bed transfer assist level: Minimal Assistance - Patient > 75% ?  ?  ?Locomotion ?Ambulation ? ? ?Ambulation assist ? ?  Ambulation activity did not occur: Safety/medical concerns ? ?  ?  ?   ? ?Walk 10 feet activity ? ? ?Assist ? Walk 10 feet activity did not occur: Safety/medical concerns ? ?  ?   ? ?Walk 50 feet activity ? ? ?Assist Walk 50 feet with 2 turns activity did not occur: Safety/medical concerns ? ?  ?   ? ? ?Walk 150 feet activity ? ? ?Assist Walk 150 feet activity did not occur: Safety/medical concerns ? ?  ?  ?  ? ?Walk 10 feet on uneven surface  ?activity ? ? ?Assist Walk 10 feet on uneven surfaces activity did not occur: Safety/medical concerns ? ? ?  ?   ? ?Wheelchair ? ? ? ? ?Assist Is the patient using a wheelchair?: Yes ?Type of Wheelchair: Manual ?  ? ?Wheelchair assist level: Supervision/Verbal cueing ?Max wheelchair distance: 15600ft   ? ? ?Wheelchair 50 feet with 2 turns activity ? ? ? ?Assist ? ?  ?  ? ? ?Assist Level: Supervision/Verbal cueing  ? ?Wheelchair 150 feet activity  ? ? ? ?Assist ?   ? ? ?Assist Level: Dependent - Patient 0%  ? ?Blood pressure 132/67, pulse 70, temperature 97.7 ?F (36.5 ?C), temperature source Oral, resp. rate 15, height 5\' 4"  (1.626 m), weight 49.8 kg, SpO2 99 %. ? ?Medical Problem List and Plan: ?1. 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 ?            - Patient may shower but incisions must be covered. ?             -ELOS/Goals: Min 10 to 14 days ? Talked to daughter- will keep until find a SNF, but need to find soon- will likely need to d/c early next week.  ? Con't CIR- PT and OT ?2.  Antithrombotics: ?-DVT/anticoagulation: Mechanical: Sequential compression devices, below knee bilateral lower extremities ?- Check Dopplers in a.m. ?-01/01/2022 cannot be on Lovenox or Eliquis due to having low platelets with a risk of bleeding.  However if his platelets get above 100 K may consider starting.   ?-4/27 Vas US: Negative for DVT bilateral ?-4/29 Continue SCD's, while trending Plts ?-4/29 Plts today 132, up from 73 on 4/27, continue to trend to    ensure stays above 100K ?-4/30 Plts at 154 today, continue to trend with am labs ?5/1- plts 206- will start Lovenox since high risk for DVT and recheck labs Thursday.  ? 5/9- plts 187k- con't lovenox ? ?  Latest Ref Rng & Units 01/12/2022  ?  5:31 AM 01/09/2022  ?  6:20 AM 01/08/2022  ?  5:22 AM  ?CBC  ?WBC 4.0 - 10.5 K/uL 2.4   14.8   15.6    ?Hemoglobin 13.0 - 17.0 g/dL 9.3   8.5   8.4    ?Hematocrit 39.0 - 52.0 % 28.6   25.8   25.5    ?Platelets 150 - 400 K/uL 187   220   211    ?  ? -antiplatelet therapy:N/A ?3. Pain Management/Post-Op: Start Tylenol 650 mg 3 times daily.  Oxycodone as needed. ?4. Mood: Team to provide a good support.  May need antidepressant-family and patient some disappointed and had multiple questions anxiety with regards to recovery  and interventions potentially seeking second opinion etc.  Discussed with Dr. Johnsie CancelBourne specially on spinal cord pathology. ?-01/01/2022 we will start Celexa 20 mg daily for mood. ?-4/29 Mood appropriate today. ?5/10- more sad today, but said he's "  tolerable".  ?- Antipsychotic agents: Not applicable ?5. Neuropsych: This patient may be capable of making decisions on his own behalf. ?6. Skin/Wound Care: Routine pressure relief measures.  ?            --Gerhadt's cream to scrotal breakdown ?           -4/30-Continue use of cream as protective barrier. ?5/10- is healing per staff- con't regimen  ? ?7. Fluids/Electrolytes/Nutrition: Routine in and outs with follow-up chemistries ?8.  History of BPH/urinary retention.  Continue to hold Hytrin and Flomax for now. ?- Monitor BP for any orthostatic changes/drops. ?-4/30 Pending orthostatic vitals.  Pt voiding adequately s/p Foley removal. ?9.  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     -Has varied from 100 down to 44 down to 56 ?-4/29 Plts today 132, up from 73 on 4/27, continue to trend to    ensure stays above 100K ?-4/30 Plts at 154 today, continue to trend with am labs ?5/1- Plts 206- start Lovenox ?5/6 patient to continue Lovenox no bleeding issues. ? 5/9- Plts 187k- doing well- cont' lovneox ?10.  ABLA: Monitor H/H for stability. ?- Improved from 5.7-10.  Recheck CBC in AM. ?-01/01/22 hemoglobin 9.1. ?-01/03/22 today hemoglobin has dropped to 7.0 there are no obvious signs of bleeding.  Patient does not report seeing visible blood in stool or urine.  New orders for stool guaiac x3 UA to rule out blood in urine repeat H&H and orthostatic blood pressure to monitor for symptoms. ?-4/29 U/A negative for blood in urine. ?-4/30 Hgb up to 7.4 today from 7.0 yesterday, CBC in am to trend. Pending stool guaiac.  ?5/1- Hb 8.1 so don't know why it dropped so much- con't to monitor ?5/4- HGB 8.4 today, appears to be stable, continue to monitor ? 5/5 hgb stable at  8.5, continue to monitor ? 5/6 we will continue to trend hemoglobin on q. Monday labs ?5/9- Hb up to 9.3- cont' to monitor ? ?  Latest Ref Rng & Units 01/12/2022  ?  5:31 AM 01/09/2022  ?  6:20 AM 01/08/2022  ?

## 2022-01-14 NOTE — Progress Notes (Signed)
Physical Therapy Session Note ? ?Patient Details  ?Name: Allen Arias ?MRN: [id number] ?Date of Birth: 10-29-1940 ? ?Today's Date: 01/14/2022 ?PT Individual Time: 5462-7035 ?PT Individual Time Calculation (min): 72 min  ? ?Short Term Goals: ?Week 2:  PT Short Term Goal 1 (Week 2): Will be able to maintain dynamic sitting balance with feet supported during reaching activity CGA ?PT Short Term Goal 2 (Week 2): Will be able to demonstrate bed/chair transfer CGA without requiring verbal cuing for hand placement ?PT Short Term Goal 3 (Week 2): Will be able to perform supine <> sit with CGA utilizing leg straps ? ?Skilled Therapeutic Interventions/Progress Updates: Pt presented in w/c agreeable to therapy. Interpreter via [place], Benjamine Mola [phone] used during session. Pt states only incisional pain currently. Pain meds provided at end of session from nsg. Session focused on NMR via forced use and sitting balance. Pt propelled to rehab gym with supervision and increased time due to fatigue. Participated in x 2 Sit to stand in parallel bars requiring maxA on both attempts. Pt noted to primarily use BUE for support but was able with max multimodal cues engage LLE into TKE and perform anterior translation of hips to improve standing posture. Pt notably fatigued after activity therefore PTA transported pt to ortho gym. Pt was able to apply breaks and remove leg rests with increased time and supervision. Pt performed Slide board transfer to mat with CGA and increased time with verbal cues. Pt noted to have improved anterior lean when performing activity as compared to previous time with this therapist. At mat pt performed AA LAQ on LLE x 10 with pt able to extend ~45degrees and PTA completing range. Performed shoulder flexion to 90 then small truncal forward flexions with 3Kg weighted ball 2 x 5. Pt also performed anterior leans with hands on chair placed in front of pt and emphasis on pushing through BLE to improve hip clearing x  5. Performed modified sit ups 2 x 5 within small range and arms crossed over chest. Pt did require breaks between activities due to [place]. Pt returned to w/c via Slide board with minA due to pt pushing up and decreased forward flexion. Pt propelled back to room and performed Slide board transfer with minA. Pt was able to use leg loop to pull LLE onto bed but unable to coordinate using R leg loop.Once in bed PTA removed TED hose and pt left in bed with bed alarm on, call bell within reach and needs met.  ?   ? ?Therapy Documentation ?Precautions:  ?Precautions ?Precautions: Fall ?Precaution Comments: foley ?Restrictions ?Weight Bearing Restrictions: No ?General: ?  ?Vital Signs: ?Therapy Vitals ?Temp: 98.2 ?F (36.8 ?C) ?Temp Source: Oral ?Pulse Rate: 71 ?Resp: 15 ?BP: (!) 147/72 ?Patient Position (if appropriate): Lying ?Oxygen Therapy ?SpO2: 99 % ?O2 Device: Room Air ?Pain: ?  ?Mobility: ?  ?Locomotion : ?   ?Trunk/Postural Assessment : ?   ?Balance: ?  ?Exercises: ?  ?Other Treatments:   ? ? ? ?Therapy/Group: Individual Therapy ? ?Jana Swartzlander ?01/14/2022, 3:56 PM  ?

## 2022-01-14 NOTE — Progress Notes (Signed)
Occupational Therapy Session Note ? ?Patient Details  ?Name: Allen Arias ?MRN: [id number] ?Date of Birth: 1941-08-13 ? ?Today's Date: 01/14/2022 ?OT Individual Time: [id number] ?OT Individual Time Calculation (min): 70 min  ? ? ?Short Term Goals: ?Week 2:  OT Short Term Goal 1 (Week 2): Pt will compeltes SB transfer to [facility] wiht MOD A ?OT Short Term Goal 2 (Week 2): Pt will maintain circle sitting wiht MIN A in prep for LB dressing for 5 min ?OT Short Term Goal 3 (Week 2): Pt will complete UB dressing with min A seated EOB ?OT Short Term Goal 4 (Week 2): Pt will perform supine>sit EOB with min A in preparation for transfers ? ?Skilled Therapeutic Interventions/Progress Updates:  ?  [employer] interpreter at beginning of session and later disconnected. Pt resting in bed upon arrival. OT intervention with focus on bed mobility, BADL retraining, w/c mobility, functional SB tranfsers, and safety awareness. Pt incontinent of bladder. LB bathing/dressing at bed level with tot A. Pt initiates pulling pants over hips but unable to when in sidelying. Unable to bridge sufficiently to pull pants over hips. Supine>sit EOB with min . Pt moves BLE off EOB with min A. SB transfer to w/c with min A/CGA. UB bathing/dressing with supervision. Pt propel w/c to gym. BUE therex with 5# bar-chest presses 3x10 and overhead presses 3x8. Pt tapped beach ball with 4# bar 3x20. Pt propelled w/c back to room and transferred to bed with CGA. Sit>supine with mod A. Pt remained in bed with all needs within reach and bed alarm activated.  ? ?Therapy Documentation ?Precautions:  ?Precautions ?Precautions: Fall ?Precaution Comments: foley ?Restrictions ?Weight Bearing Restrictions: No ?Pain: ? Pt reports BLE/groin discomfort; RN admin meds during session ? ? ?Therapy/Group: Individual Therapy ? ?Leroy Libman ?01/14/2022, 9:34 AM ?

## 2022-01-15 NOTE — Progress Notes (Signed)
Physical Therapy Weekly Progress Note ? ?Patient Details  ?Name: Allen Arias ?MRN: [id number] ?Date of Birth: Nov 04, 1940 ? ?Beginning of progress report period: Jan 08, 2022 ?End of progress report period: Jan 15, 2022 ? ?Today's Date: 01/15/2022 ? ?Patient has met 1 of 3 short term goals.  Pt is making slow but steady progress towards goals. Pt is demonstrating improved technique during Slide board transfers however does require intermittent cues for hand placement and improved head/hips relationship. Pt also continues to be limited in increasing anterior weight shifting partially due to fear of falling. Pt is near supervision level for static sitting and CGA for small challenges outside BOS with B foot support. Pt is making good progress with use of leg loops and has been using them appropriately for bed mobility and set up for transfers.  ? ?Patient continues to demonstrate the following deficits muscle weakness and muscle paralysis and impaired timing and sequencing, unbalanced muscle activation, and decreased coordination and therefore will continue to benefit from skilled PT intervention to increase functional independence with mobility. ? ?Patient progressing toward long term goals..  Continue plan of care. ? ?PT Short Term Goals ?Week 2:  PT Short Term Goal 1 (Week 2): Will be able to maintain dynamic sitting balance with feet supported during reaching activity CGA ?PT Short Term Goal 1 - Progress (Week 2): Progressing toward goal ?PT Short Term Goal 2 (Week 2): Will be able to demonstrate bed/chair transfer CGA without requiring verbal cuing for hand placement ?PT Short Term Goal 2 - Progress (Week 2): Progressing toward goal ?PT Short Term Goal 3 (Week 2): Will be able to perform supine <> sit with CGA utilizing leg straps ?PT Short Term Goal 3 - Progress (Week 2): Met ?Week 3:  PT Short Term Goal 1 (Week 3): Will be able to perform bed/chair transfer CGA with good demonstration of head/hips relationship ?PT  Short Term Goal 2 (Week 3): Will be able to maintain dynamic sitting balance with CGA consistently ?PT Short Term Goal 3 (Week 3): Will be able to manage w/c parts to set chair up for transfer with Supervision and cueing ? ? ?Therapy Documentation ?Precautions:  ?Precautions ?Precautions: Fall ?Precaution Comments: foley ?Restrictions ?Weight Bearing Restrictions: No ? ? ? ?Therapy/Group: Individual Therapy ? ?Rosita DeChalus ?Excell Seltzer, PT, DPT, CSRS ?01/15/2022, 2:12 PM  ?

## 2022-01-15 NOTE — Progress Notes (Signed)
Occupational Therapy Weekly Progress Note ? ?Patient Details  ?Name: Allen Arias ?MRN: [id number] ?Date of Birth: [birth year] ? ?Beginning of progress report period: Jan 07, 2022 ?End of progress report period: Jan 15, 2022 ? ?Patient has met 4 of 4 short term goals.  Pt is making steady progress with UB BADLs, SB tranfsers, and sitting balance. Pt requires tot A for LB dressing tasks at bed level or sitting EOB. Pt completes UB bathing/dressing tasks w/c level with supervision and CGA when seated EOB. Bed<>w/c SB tranfsers with CGA/min A. DABSC SB transfers with mod A. Family has not been present for education. ? ?Patient continues to demonstrate the following deficits: muscle weakness, impaired timing and sequencing, abnormal tone, and unbalanced muscle activation, and decreased sitting balance, decreased standing balance, and decreased balance strategies and therefore will continue to benefit from skilled OT intervention to enhance overall performance with BADL, iADL, and Reduce care partner burden. ? ?Patient progressing toward long term goals..  Continue plan of care. ? ?OT Short Term Goals ?Week 2:  OT Short Term Goal 1 (Week 2): Pt will compeltes SB transfer to [facility] wiht MOD A ?OT Short Term Goal 1 - Progress (Week 2): Met ?OT Short Term Goal 2 (Week 2): Pt will maintain circle sitting wiht MIN A in prep for LB dressing for 5 min ?OT Short Term Goal 2 - Progress (Week 2): Met ?OT Short Term Goal 3 (Week 2): Pt will complete UB dressing with min A seated EOB ?OT Short Term Goal 3 - Progress (Week 2): Met ?OT Short Term Goal 4 (Week 2): Pt will perform supine>sit EOB with min A in preparation for transfers ?OT Short Term Goal 4 - Progress (Week 2): Met ?Week 3:  OT Short Term Goal 1 (Week 3): Pt will complete LB dressing at bed level or sitting EOB with max A ?OT Short Term Goal 2 (Week 3): Pt will perform DABSC transfers with min A (SB or scoot) ?OT Short Term Goal 3 (Week 3): Pt will perform clothing mgmt tasks  seated on DABSC with max A ? ? ?Leroy Libman ?01/15/2022, 2:59 PM  ?

## 2022-01-15 NOTE — Progress Notes (Signed)
Patient had a quite night. Post void residuals this morning was above 400 mls. Consent sought after explanation of procedure. Intermittent catheterization performed with coude cath after regular 16 size foley failed due to resistance while threading catheter. 370 mls orange colored urine collected. Encouraged oral fluids. Denies any pain. Made comfortable in bed.  ?

## 2022-01-15 NOTE — Progress Notes (Signed)
Patient ID: Allen Arias, male   DOB: 03-Dec-1940, 81 y.o.   MRN: [id number] ? ?SW returned phone call to Lori/Admissions with [facility] [phone]) to discuss SNF placement. Reports she will look over referral, and no bed today and likely next week if they are able to accept. Only beds in long term care. Will review and will follow-up. ? ?Cecile Sheerer, MSW, LCSWA ?Office: [phone] ?Cell: [phone] ?Fax: [phone]  ?

## 2022-01-15 NOTE — Progress Notes (Signed)
?                                                       PROGRESS NOTE ? ? ?Subjective/Complaints: ? ?Pt required in/out cath x1 last night, for urinary retention >400cc.  ? ?Wasn't happy about it- asking when bladder will start to function again- explained I don't know if or when that will occur.  ? ?Otherwise, is doing the same.  ? ?[id number] by language ? ?Objective: ?  ?No results found. ? ?No results for input(s): WBC, HGB, HCT, PLT in the last 72 hours. ? ?No results for input(s): NA, K, CL, CO2, GLUCOSE, BUN, CREATININE, CALCIUM in the last 72 hours. ? ? ?Intake/Output Summary (Last 24 hours) at 01/15/2022 0817 ?Last data filed at 01/15/2022 0816 ?Gross per 24 hour  ?Intake 360 ml  ?Output 370 ml  ?Net -10 ml  ?  ? ?  ? ?Physical Exam: ? ? ? ? ?General: awake, alert, appropriate, sitting up in bed; using tele-interpretor to talk/interact; NAD ?HENT: conjugate gaze; oropharynx moist ?CV: regular rate; no JVD ?Pulmonary: CTA B/L; no W/R/R- good air movement ?GI: soft, NT, ND, (+)BS ?Psychiatric: appropriate but depressed affect ?Neurological: alert ?GU: Incontinence of urine, wearing brief. No change ?Musc: No edema. Still 1/5 in RLE; 2 to 2-/5 in LLE- slightly bette rin LLE ?Extremities pitting LLE edema 1-2+ to L knee- trace on RLE ?Skin: Bilateral groin incision, flat, non-tender, non-swollen, skin glue, RLE incision: flat, non-tender, non-swollen, skin glue- stable ?Psychiatric appropriate-flattened affect ? ?Physical exam unchanged since 5/6 ? ?Vital Signs ?Blood pressure 136/73, pulse (!) 59, temperature 98.5 ?F (36.9 ?C), resp. rate 15, height 5\' 4"  (1.626 m), weight 53.5 kg, SpO2 100 %. ? ? ? ?Assessment/Plan: ?1. Functional deficits which require 3+ hours per day of interdisciplinary therapy in a comprehensive inpatient rehab setting. ?Physiatrist is providing close team supervision and 24 hour management of active medical problems listed below. ?Physiatrist and rehab team continue to assess barriers  to discharge/monitor patient progress toward functional and medical goals ? ?Care Tool: ? ?Bathing ?   ?Body parts bathed by patient: Right arm, Left arm, Chest, Abdomen, Front perineal area, Right upper leg, Left upper leg, Face  ? Body parts bathed by helper: Buttocks, Right lower leg, Left lower leg ?Body parts n/a: Front perineal area ?  ?Bathing assist Assist Level: Moderate Assistance - Patient 50 - 74% ?  ?  ?Upper Body Dressing/Undressing ?Upper body dressing   ?What is the patient wearing?: Pull over shirt ?   ?Upper body assist Assist Level: Supervision/Verbal cueing ?   ?Lower Body Dressing/Undressing ?Lower body dressing ? ? ?   ?What is the patient wearing?: Pants, Incontinence brief ? ?  ? ?Lower body assist Assist for lower body dressing: Maximal Assistance - Patient 25 - 49% ?   ? ?Toileting ?Toileting    ?Toileting assist Assist for toileting: Dependent - Patient 0% ?  ?  ?Transfers ?Chair/bed transfer ? ?Transfers assist ?   ? ?Chair/bed transfer assist level: Minimal Assistance - Patient > 75% ?  ?  ?Locomotion ?Ambulation ? ? ?Ambulation assist ? ? Ambulation activity did not occur: Safety/medical concerns ? ?  ?  ?   ? ?Walk 10 feet activity ? ? ?Assist ? Walk 10 feet activity did not occur: Safety/medical concerns ? ?  ?   ? ?  Walk 50 feet activity ? ? ?Assist Walk 50 feet with 2 turns activity did not occur: Safety/medical concerns ? ?  ?   ? ? ?Walk 150 feet activity ? ? ?Assist Walk 150 feet activity did not occur: Safety/medical concerns ? ?  ?  ?  ? ?Walk 10 feet on uneven surface  ?activity ? ? ?Assist Walk 10 feet on uneven surfaces activity did not occur: Safety/medical concerns ? ? ?  ?   ? ?Wheelchair ? ? ? ? ?Assist Is the patient using a wheelchair?: Yes ?Type of Wheelchair: Manual ?  ? ?Wheelchair assist level: Supervision/Verbal cueing ?Max wheelchair distance: 18400ft  ? ? ?Wheelchair 50 feet with 2 turns activity ? ? ? ?Assist ? ?  ?  ? ? ?Assist Level: Supervision/Verbal cueing   ? ?Wheelchair 150 feet activity  ? ? ? ?Assist ?   ? ? ?Assist Level: Dependent - Patient 0%  ? ?Blood pressure 136/73, pulse (!) 59, temperature 98.5 ?F (36.9 ?C), resp. rate 15, height 5\' 4"  (1.626 m), weight 53.5 kg, SpO2 100 %. ? ?Medical Problem List and Plan: ?1. 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 ?            - Patient may shower but incisions must be covered. ?             -ELOS/Goals: Min 10 to 14 days ? 5/10- Talked to daughter- will keep until find a SNF, but need to find soon- will likely need to d/c early next week.  ? Continue CIR- PT, OT - searhcing for SNF placement per family.  ?2.  Antithrombotics: ?-DVT/anticoagulation: Mechanical: Sequential compression devices, below knee bilateral lower extremities ?- Check Dopplers in a.m. ?-01/01/2022 cannot be on Lovenox or Eliquis due to having low platelets with a risk of bleeding.  However if his platelets get above 100 K may consider starting.   ?-4/27 Vas US: Negative for DVT bilateral ?-4/29 Continue SCD's, while trending Plts ?-4/29 Plts today 132, up from 73 on 4/27, continue to trend to    ensure stays above 100K ?-4/30 Plts at 154 today, continue to trend with am labs ?5/1- plts 206- will start Lovenox since high risk for DVT and recheck labs Thursday.  ? 5/9- plts 187k- con't lovenox ? 5/11- will need lovenox for a total of 3 months from SCI- so ~ 03/26/22 ? ?  Latest Ref Rng & Units 01/12/2022  ?  5:31 AM 01/09/2022  ?  6:20 AM 01/08/2022  ?  5:22 AM  ?CBC  ?WBC 4.0 - 10.5 K/uL 2.4   14.8   15.6    ?Hemoglobin 13.0 - 17.0 g/dL 9.3   8.5   8.4    ?Hematocrit 39.0 - 52.0 % 28.6   25.8   25.5    ?Platelets 150 - 400 K/uL 187   220   211    ?  ? -antiplatelet therapy:N/A ?3. Pain Management/Post-Op: Start Tylenol 650 mg 3 times daily.  Oxycodone as needed. ?4. Mood: Team to provide a good support.  May need antidepressant-family and patient some disappointed and had multiple questions anxiety with regards to recovery and  interventions potentially seeking second opinion etc.  Discussed with Dr. Johnsie CancelBourne specially on spinal cord pathology. ?-01/01/2022 we will start Celexa 20 mg daily for mood. ?-4/29 Mood appropriate today. ?5/10- more sad today, but said he's "tolerable".  ?- Antipsychotic agents: Not applicable ?5. Neuropsych: This patient may be capable of making decisions on  his own behalf. ?6. Skin/Wound Care: Routine pressure relief measures.  ?            --Gerhadt's cream to scrotal breakdown ?           -4/30-Continue use of cream as protective barrier. ?5/10- is healing per staff- con't regimen  ? ?7. Fluids/Electrolytes/Nutrition: Routine in and outs with follow-up chemistries ?8.  History of BPH/urinary retention.  Continue to hold Hytrin and Flomax for now. ?- Monitor BP for any orthostatic changes/drops. ?-4/30 Pending orthostatic vitals.  Pt voiding adequately s/p Foley removal. ?9.  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     -Has varied from 100 down to 44 down to 56 ?-4/29 Plts today 132, up from 73 on 4/27, continue to trend to    ensure stays above 100K ?-4/30 Plts at 154 today, continue to trend with am labs ?5/1- Plts 206- start Lovenox ?5/6 patient to continue Lovenox no bleeding issues. ? 5/9- Plts 187k- doing well- cont' lovneox ? 5/11- use lovenox until 03/26/22 to prevetn DVT then can stop ?10.  ABLA: Monitor H/H for stability. ?- Improved from 5.7-10.  Recheck CBC in AM. ?-01/01/22 hemoglobin 9.1. ?-01/03/22 today hemoglobin has dropped to 7.0 there are no obvious signs of bleeding.  Patient does not report seeing visible blood in stool or urine.  New orders for stool guaiac x3 UA to rule out blood in urine repeat H&H and orthostatic blood pressure to monitor for symptoms. ?-4/29 U/A negative for blood in urine. ?-4/30 Hgb up to 7.4 today from 7.0 yesterday, CBC in am to trend. Pending stool guaiac.  ?5/1- Hb 8.1 so don't know why it dropped so much- con't to monitor ?5/4- HGB 8.4 today,  appears to be stable, continue to monitor ? 5/5 hgb stable at 8.5, continue to monitor ? 5/6 we will continue to trend hemoglobin on q. Monday labs ?5/9- Hb up to 9.3- cont' to monitor ? ?  Latest Ref Rng & Units 5/

## 2022-01-15 NOTE — Progress Notes (Signed)
Physical Therapy Session Note ? ?Patient Details  ?Name: Allen Arias ?MRN: [id number] ?Date of Birth: 09/16/1940 ? ?Today's Date: 01/15/2022 ?PT Individual Time: 3716-9678 and 9381-0175 ?PT Individual Time Calculation (min): 75 min and 71 min ? ?Short Term Goals: ?Week 2:  PT Short Term Goal 1 (Week 2): Will be able to maintain dynamic sitting balance with feet supported during reaching activity CGA ?PT Short Term Goal 2 (Week 2): Will be able to demonstrate bed/chair transfer CGA without requiring verbal cuing for hand placement ?PT Short Term Goal 3 (Week 2): Will be able to perform supine <> sit with CGA utilizing leg straps ? ?Skilled Therapeutic Interventions/Progress Updates: Pt presented in bed agreeable to therapy. Pt denies pain other than discomfort at incision site which pt did not rate. [employer] interpreter used throughout session [place] #[id number]. PTA donned leg loops and shoes total A for time management. Performed supine to sit with CGA nearing close supervision with use of bed features and leg loops. Total A for Slide board set up and pt performed transfer to w/c with CGA and level surface. Pt transported to rehab gym and performed Slide board transfer to elevated mat requiring modA, increased multimodal cues for anterior lean and head/hips relationship. At mat pt able to transition to supine with minA and multimodal cues for sequencing. Pt then performed the following therex for increased ms recruitment in BLE: hip abd/add x 10, heel slides (AA on RLE) x 10, AA modified bridge with use of bolster x 10 then x 5. Pt then transitioned to sidelying with use of powder board - AA knee flexion ext on RLE x 10, hip flexion RLE x 10. Pt also performed modified sit up from semi-recumbent with BUE extended holding 1Kg weighted ball. Pt then performed supine to long sit using BUE and core requiring mod A on first attempt and minA on second. While in long sit PTA had pt increase forward reach extending arms to distal  LE's. Pt transitioned to EOM with use of leg loops with supervision and pt was able to complete AA LAQ on LLE and small range AA knee extension with RLE x 10. Performed block practice scooting L/R on mat with PTA instructing pt to increase forward lean and to try to push down with BLE to improve hip clearance. Pt then returned to w/c via Slide board and CGA and propelled back to room.  Pt agreeable to remain in w/c at end of session as PTA will return for next session in approx 1 hour. Pt left in w/c at end of session with belt alarm on, call bell within reach and needs met.  ? ?Tx2: Pt presented in w/c agreeable to therapy. [employer] video interpreter used #[id number]. Pt states that increased pain in low back near hip which he feels is from sitting in w/c. Upon palpation pt noted to be TTP at L glute med. Discussed with pt that hips and BLE were worked extensively and per pt descriptors appears to be more ms soreness vs LBP. Pt verbalized understanding. Pt transported to day room and participated in Cybex Kinetron 80cm/sec 10 cycles x 2 for forced use. With max multimodal cues pt was able to lift elevate LLE without use of leg loops and push minimally with RLE. PTA assisted with LE management approx 80% of time. Pt then moved over to high low mat and pt performed Slide board transfer to mat with CGA and increased cues to push with BLE. Pt then transferred to sidelying with minA  for BLE and PTA performed active release of glute med x 10 for pain management. Pt returned to supine and PTA performed active release of L hamstrings x 10. For energy conservation pt was minA for supine to sit. Pt then participated in sitting balance activities including reaching for clothespins with PTA gently facilitating increasing anterior lean to place at target 2 x 8. Pt also used extra large physioball to perform forward reaches/rolls x 10. Pt set up for Slide board transfer an returned to w/c with CGA with pt primarily using BUE. Pt  transported to 4 W/MW hallway and pt propelled back to room >25f for general conditioning. In room pt was able to align himself appropriately to bed and lock brakes. PTA set up Slide board and pt performed Slide board transfer with CGA. Pt returned to sit to supine with CGA and increased time. Pt repositioned to comfort and left with bed alarm on, call bell within reach and needs met.  ?   ? ?Therapy Documentation ?Precautions:  ?Precautions ?Precautions: Fall ?Precaution Comments: foley ?Restrictions ?Weight Bearing Restrictions: No ? ? ?Therapy/Group: Individual Therapy ? ?Renaldo Gornick ?01/15/2022, 12:20 PM  ?

## 2022-01-15 NOTE — Plan of Care (Signed)
?  Problem: Consults ?Goal: RH STROKE PATIENT EDUCATION ?Description: See Patient Education module for education specifics  ?Outcome: Progressing ?  ?Problem: RH BOWEL ELIMINATION ?Goal: RH STG MANAGE BOWEL WITH ASSISTANCE ?Description: STG Manage Bowel with Min Assistance. ?Outcome: Progressing ?Goal: RH STG MANAGE BOWEL W/MEDICATION W/ASSISTANCE ?Description: STG Manage Bowel with Medication with Min Assistance. ?Outcome: Progressing ?  ?Problem: RH BLADDER ELIMINATION ?Goal: RH STG MANAGE BLADDER WITH ASSISTANCE ?Description: STG Manage Bladder With Min Assistance ?Outcome: Progressing ?Goal: RH STG MANAGE BLADDER WITH MEDICATION WITH ASSISTANCE ?Description: STG Manage Bladder With Medication With Min Assistance. ?Outcome: Progressing ?  ?Problem: RH SKIN INTEGRITY ?Goal: RH STG MAINTAIN SKIN INTEGRITY WITH ASSISTANCE ?Description: STG Maintain Skin Integrity With Min Assistance. ?Outcome: Progressing ?Goal: RH STG ABLE TO PERFORM INCISION/WOUND CARE W/ASSISTANCE ?Description: STG Able To Perform Incision/Wound Care With Min Assistance. ?Outcome: Progressing ?  ?Problem: RH SAFETY ?Goal: RH STG ADHERE TO SAFETY PRECAUTIONS W/ASSISTANCE/DEVICE ?Description: STG Adhere to Safety Precautions With Cues and Reminders. ?Outcome: Progressing ?Goal: RH STG DECREASED RISK OF FALL WITH ASSISTANCE ?Description: STG Decreased Risk of Fall With Min Assistance. ?Outcome: Progressing ?  ?

## 2022-01-15 NOTE — Progress Notes (Signed)
Occupational Therapy Session Note ? ?Patient Details  ?Name: Allen Arias ?MRN: [id number] ?Date of Birth: 11-27-1940 ? ?Today's Date: 01/15/2022 ?OT Individual Time: 6222-9798 ?OT Individual Time Calculation (min): 71 min  ? ? ?Short Term Goals: ?Week 2:  OT Short Term Goal 1 (Week 2): Pt will compeltes SB transfer to [facility] wiht MOD A ?OT Short Term Goal 2 (Week 2): Pt will maintain circle sitting wiht MIN A in prep for LB dressing for 5 min ?OT Short Term Goal 3 (Week 2): Pt will complete UB dressing with min A seated EOB ?OT Short Term Goal 4 (Week 2): Pt will perform supine>sit EOB with min A in preparation for transfers ? ?Skilled Therapeutic Interventions/Progress Updates:  ?  [employer] interpreter Judeth Cornfield [phone]) Pt resting in bed upon arrival and agreeable to therapy. LB dressing at bed level with max A for donning pants. Supine>sit EOB with min A. Sitting balance EOB with CGA/supervision. SB transfer to w/c with CGA after SB placement. Pt completed UB bathing/dressing tasks w/c level at sink with supervision. Pt propelled w/c to gym and initialy engaged in BLE knee extension task kicking beach ball. Pt able to kick ball with LLE without assistance. Pt required assistance with RLE (gravity eliminated) to initiate knee extension. Pt returned to room and completed SB transfer to bed with CGA. Pt remained in bed with all needs within reach and bed alarm activated.  ? ?Therapy Documentation ?Precautions:  ?Precautions ?Precautions: Fall ?Precaution Comments: foley ?Restrictions ?Weight Bearing Restrictions: No ? ?Pain: ? Pt denies pain this morning ? ? ?Therapy/Group: Individual Therapy ? ?[name] ?01/15/2022, 9:34 AM ?

## 2022-01-15 NOTE — Progress Notes (Signed)
?  Progress Note ? ? ? ?01/15/2022 ?8:48 AM ?* No surgery found * ? ?Subjective: No complaints this morning ? ?Vitals:  ? 01/14/22 2010 01/15/22 0453  ?BP: 139/68 136/73  ?Pulse: 73 (!) 59  ?Resp: 17 15  ?Temp: 98.6 ?F (37 ?C) 98.5 ?F (36.9 ?C)  ?SpO2: 99% 100%  ? ? ?Physical Exam: ?He is awake and alert and oriented ?He has minimal motor of the left lower extremity states that he cannot move the right lower extremity ?All incisions are healing well and both feet are warm and well-perfused ? ?CBC ?   ?Component Value Date/Time  ? WBC 2.4 (L) 01/12/2022 0531  ? RBC 2.85 (L) 01/12/2022 0531  ? HGB 9.3 (L) 01/12/2022 0531  ? HCT 28.6 (L) 01/12/2022 0531  ? PLT 187 01/12/2022 0531  ? MCV 100.4 (H) 01/12/2022 0531  ? MCH 32.6 01/12/2022 0531  ? MCHC 32.5 01/12/2022 0531  ? RDW 18.7 (H) 01/12/2022 0531  ? LYMPHSABS 0.4 (L) 01/09/2022 [id number]  ? MONOABS 0.6 01/09/2022 0620  ? EOSABS 0.0 01/09/2022 0620  ? BASOSABS 0.0 01/09/2022 0620  ? ? ?BMET ?   ?Component Value Date/Time  ? NA 133 (L) 01/12/2022 0531  ? K 4.1 01/12/2022 0531  ? CL 103 01/12/2022 0531  ? CO2 27 01/12/2022 0531  ? GLUCOSE 96 01/12/2022 0531  ? BUN 25 (H) 01/12/2022 0531  ? CREATININE 0.74 01/12/2022 0531  ? CALCIUM 8.6 (L) 01/12/2022 0531  ? GFRNONAA >60 01/12/2022 0531  ? GFRAA >60 02/04/2020 0529  ? ? ?INR ?   ?Component Value Date/Time  ? INR 1.4 (H) 12/27/2021 0532  ? ? ? ?Intake/Output Summary (Last 24 hours) at 01/15/2022 0848 ?Last data filed at 01/15/2022 0816 ?Gross per 24 hour  ?Intake 360 ml  ?Output 370 ml  ?Net -10 ml  ? ? ? ?Assessment/plan:  81 y.o. 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  Unfortunately has not regained much motor function in either lower extremity.  Physical medicine and rehabilitation care much appreciated. ? ? ? ? ?Dyani Babel C. Donzetta Matters, MD ?[facility] ?Office: [phone] ?Pager: [phone] ? ?01/15/2022 ?8:48 AM ? ?

## 2022-01-16 NOTE — Progress Notes (Signed)
Physical Therapy Session Note ? ?Patient Details  ?Name: Allen Arias ?MRN: [id number] ?Date of Birth: 03/22/1941 ? ?Today's Date: 01/16/2022 ?PT Individual Time: 2035-5974 and 1638-4536 ?PT Individual Time Calculation (min):  24 min and 27 min  ? ?Short Term Goals: ?Week 3:  PT Short Term Goal 1 (Week 3): Will be able to perform bed/chair transfer CGA with good demonstration of head/hips relationship ?PT Short Term Goal 2 (Week 3): Will be able to maintain dynamic sitting balance with CGA consistently ?PT Short Term Goal 3 (Week 3): Will be able to manage w/c parts to set chair up for transfer with Supervision and cueing ? ?Skilled Therapeutic Interventions/Progress Updates: Tx1: Pt presented in bed eating breakfast upon therapist arrival. PTA returned ~15 min later with pt still eating, PTA returned additional 10 min later with pt indicating mostly finished with meal. Pt denies pain except at incisional site. Pt noted to have soiled brief therefore PTA performed peri-care with pt rolling L/R with use of bed rail. PTA donned brief and threaded pants total A. Pt was able to roll in same manner to allow PTA to pull pants over hips. PTA then performed stretching to hamstrings and heel cord 1 min x 3 bilaterally and ER/IR ROM to bilateral hips. Pt left in bed at end of session with bed alarm on, call bell within reach and needs met.  ? ?Tx2: Pt presented in bed agreeable to therapy.Pt only states discomfort at incision site. [employer] interpreter used Truett Mainland [phone].  PTA donned leg loops and shoes total A. Pt performed supine to sit with CGA and increased time with use of leg loops and bed features. Pt required multimodal cues and facilitation at trunk to scoot forward to EOB. Pt reuqired total A for Slide board set up and performed Slide board transfer to w/c CGA. Pt propelled to ortho gym with supervision and transferred to high/low mat with minA. Pt participated in forward reaching activities reaching and stacking cones  on bench. Pt using contralateral UE to stabilize but pt able to reach slightly more forward than noted in some previous sessions. Pt handed off to [place], [place] for continuation of therapy intervention.  ?   ? ?Therapy Documentation ?Precautions:  ?Precautions ?Precautions: Fall ?Precaution Comments: foley ?Restrictions ?Weight Bearing Restrictions: No ?General: ?PT Amount of Missed Time (min): 21 Minutes ?Vital Signs: ?  ?Pain: ?Pain Assessment ?Pain Scale: 0-10 ?Pain Score: 0-No pain ?Mobility: ?  ?Locomotion : ?   ?Trunk/Postural Assessment : ?   ?Balance: ?  ?Exercises: ?  ?Other Treatments:   ? ? ? ?Therapy/Group: Individual Therapy ? ?[place] Sarinana ?01/16/2022, 12:31 PM  ?

## 2022-01-16 NOTE — Progress Notes (Signed)
Bowel program initiated this hs with Dig Stim. Medium hard stool removed from rectum. Pt. Tolerated well. Encourage pt to drink fluids. ?

## 2022-01-16 NOTE — Progress Notes (Signed)
Occupational Therapy Session Note ? ?Patient Details  ?Name: Allen Arias ?MRN: [id number] ?Date of Birth: Feb 14, 1941 ? ?Today's Date: 01/16/2022 ?OT Individual Time: 1350-1445 ?OT Individual Time Calculation (min): 55 min  ? ? ?Short Term Goals: ?Week 1:  OT Short Term Goal 1 (Week 1): Pt will sit EOB dynamically with no more than MIN A for 5 min ?OT Short Term Goal 1 - Progress (Week 1): Met ?OT Short Term Goal 2 (Week 1): Pt will compeltes SB transfer to [name] wiht MOD A ?OT Short Term Goal 2 - Progress (Week 1): Progressing toward goal ?OT Short Term Goal 3 (Week 1): Pt will maintain circle sitting wiht MIN A in prep for LB dressing for 5 min ?OT Short Term Goal 3 - Progress (Week 1): Progressing toward goal ? ?Skilled Therapeutic Interventions/Progress Updates:  ?  Pt received in bed with "the usual" pain. Declined intervention. [place] interpreter utilized [place] [phone]   ?ADL: ?Pt completes ADL at overall set up grooming/MIN A bed mobility/CGA SB transfer Level. Skilled interventions include: donning leg looks and using for cuing to bring Ble to EOB and heavy use of bed rails to bring trunk upright. Pt with good use ofSB and head hips relationship to get to w/c. Self propel to sink for grooming. After returning to room after session on mat, pt requesting to change shirt. Forced use of unsupported long sitting withMOD A overall to improve long sitting balance, core activation and BADL retraining.  ? ?Therapeutic activity ? Pt completes activity on mat working from supine<>sidelying<>hooking LES tobring towards chest<>circle sitting with MOD A and demo of sequence. All mobility completed to improve BLE management, sitting balance, tolerance to long/circle sitting in prep for BADLs and functional mobility. Sequence performed with leg loops. Pt with increased tightness in hips and hamstrings decreasing ability to forward weight shift stuck in posterior pelvic tilt for circle sitting ?Pt left at end of session in bed  with exit alarm on, call light in reach and all needs met ? ? ?Therapy Documentation ?Precautions:  ?Precautions ?Precautions: Fall ?Precaution Comments: foley ?Restrictions ?Weight Bearing Restrictions: No ?General: ?  ? ? ?Therapy/Group: Individual Therapy ? ?Lowella Dell Shirline Kendle ?01/16/2022, 7:30 AM ?

## 2022-01-16 NOTE — Progress Notes (Signed)
Dig Stim performed this am with no bm in rectum. Pt tolerated well. ?

## 2022-01-16 NOTE — Progress Notes (Addendum)
Patient ID: Allen Arias, male   DOB: [birth year], 81 y.o.   MRN: [id number] ? ? ?SW spoke with pt dtr [name] to discuss if there were any updates on preferred SNF locations. She reports she is still looking for SNF locations and is considering [facility] and [facility]. States she is consulting with her father on each location and he does not like all of them. SW informed will f/u with [facility] as she was told they have rehab beds only/no LTC beds. She understands if this location offers a bed she would have to continue to search for nursing homes with LTC options. She is aware SW will follow-up on Monday to discuss other facility options.  ? ?SW left message for [employer] [facility]/Admissions with [facility] to discuss referral and waiting on follow-up. ?*referral declined in Epic as reports after speaking with pt dtr pt will need LTC.   ? ?Cecile Sheerer, MSW, LCSWA ?Office: [phone] ?Cell: [phone] ?Fax: [phone]  ?

## 2022-01-16 NOTE — Progress Notes (Signed)
?                                                       PROGRESS NOTE ? ? ?Subjective/Complaints: ? ? ?Had dig stim/suppository but no results last night.  ?Asking about foley- that we discussed.  ? ? ?Willing to discuss foley more with family.  ? ?[id number] by language.  ? ?Objective: ?  ?No results found. ? ?No results for input(s): WBC, HGB, HCT, PLT in the last 72 hours. ? ?No results for input(s): NA, K, CL, CO2, GLUCOSE, BUN, CREATININE, CALCIUM in the last 72 hours. ? ? ?Intake/Output Summary (Last 24 hours) at 01/16/2022 0850 ?Last data filed at 01/15/2022 1427 ?Gross per 24 hour  ?Intake 120 ml  ?Output --  ?Net 120 ml  ?  ? ?  ? ?Physical Exam: ? ? ? ? ? ?General: awake, alert, appropriate, sitting up in bed; NAD ?HENT: conjugate gaze; oropharynx moist ?CV: regular rate; no JVD ?Pulmonary: CTA B/L; no W/R/R- good air movement ?GI: soft, NT, ND, (+)BS ?Psychiatric: appropriate; depressed affect.  ?Neurological: Ox3 ? ?GU: Incontinence of urine, wearing brief.no change ?Musc: No edema. Still 1/5 in RLE; 2 to 2-/5 in LLE- stable ?Extremities pitting LLE edema 1-2+ to L knee- trace on RLE ?Skin: Bilateral groin incision, flat, non-tender, non-swollen, skin glue, RLE incision: flat, non-tender, non-swollen, skin glue- stable ? ?Vital Signs ?Blood pressure 132/71, pulse 71, temperature 97.9 ?F (36.6 ?C), resp. rate 15, height 5\' 4"  (1.626 m), weight 52.4 kg, SpO2 97 %. ? ? ? ?Assessment/Plan: ?1. Functional deficits which require 3+ hours per day of interdisciplinary therapy in a comprehensive inpatient rehab setting. ?Physiatrist is providing close team supervision and 24 hour management of active medical problems listed below. ?Physiatrist and rehab team continue to assess barriers to discharge/monitor patient progress toward functional and medical goals ? ?Care Tool: ? ?Bathing ?   ?Body parts bathed by patient: Right arm, Left arm, Chest, Abdomen, Front perineal area, Right upper leg, Left upper leg, Face   ? Body parts bathed by helper: Buttocks, Right lower leg, Left lower leg ?Body parts n/a: Front perineal area ?  ?Bathing assist Assist Level: Moderate Assistance - Patient 50 - 74% ?  ?  ?Upper Body Dressing/Undressing ?Upper body dressing   ?What is the patient wearing?: Pull over shirt ?   ?Upper body assist Assist Level: Supervision/Verbal cueing ?   ?Lower Body Dressing/Undressing ?Lower body dressing ? ? ?   ?What is the patient wearing?: Pants, Incontinence brief ? ?  ? ?Lower body assist Assist for lower body dressing: Maximal Assistance - Patient 25 - 49% ?   ? ?Toileting ?Toileting    ?Toileting assist Assist for toileting: Dependent - Patient 0% ?  ?  ?Transfers ?Chair/bed transfer ? ?Transfers assist ?   ? ?Chair/bed transfer assist level: Minimal Assistance - Patient > 75% ?  ?  ?Locomotion ?Ambulation ? ? ?Ambulation assist ? ? Ambulation activity did not occur: Safety/medical concerns ? ?  ?  ?   ? ?Walk 10 feet activity ? ? ?Assist ? Walk 10 feet activity did not occur: Safety/medical concerns ? ?  ?   ? ?Walk 50 feet activity ? ? ?Assist Walk 50 feet with 2 turns activity did not occur: Safety/medical concerns ? ?  ?   ? ? ?  Walk 150 feet activity ? ? ?Assist Walk 150 feet activity did not occur: Safety/medical concerns ? ?  ?  ?  ? ?Walk 10 feet on uneven surface  ?activity ? ? ?Assist Walk 10 feet on uneven surfaces activity did not occur: Safety/medical concerns ? ? ?  ?   ? ?Wheelchair ? ? ? ? ?Assist Is the patient using a wheelchair?: Yes ?Type of Wheelchair: Manual ?  ? ?Wheelchair assist level: Supervision/Verbal cueing ?Max wheelchair distance: 15ft  ? ? ?Wheelchair 50 feet with 2 turns activity ? ? ? ?Assist ? ?  ?  ? ? ?Assist Level: Supervision/Verbal cueing  ? ?Wheelchair 150 feet activity  ? ? ? ?Assist ?   ? ? ?Assist Level: Dependent - Patient 0%  ? ?Blood pressure 132/71, pulse 71, temperature 97.9 ?F (36.6 ?C), resp. rate 15, height 5\' 4"  (1.626 m), weight 52.4 kg, SpO2 97  %. ? ?Medical Problem List and Plan: ?1. 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 ?            - Patient may shower but incisions must be covered. ?             -ELOS/Goals: Min 10 to 14 days ? 5/10- Talked to daughter- will keep until find a SNF, but need to find soon- will likely need to d/c early next week.  ? Continue CIR- PT, OT - looking for SNF ?2.  Antithrombotics: ?-DVT/anticoagulation: Mechanical: Sequential compression devices, below knee bilateral lower extremities ?- Check Dopplers in a.m. ?-01/01/2022 cannot be on Lovenox or Eliquis due to having low platelets with a risk of bleeding.  However if his platelets get above 100 K may consider starting.   ?-4/27 Vas [place]: Negative for DVT bilateral ?-4/29 Continue SCD's, while trending Plts ?-4/29 Plts today 132, up from 73 on 4/27, continue to trend to    ensure stays above 100K ?-4/30 Plts at 154 today, continue to trend with am labs ?5/1- plts 206- will start Lovenox since high risk for DVT and recheck labs Thursday.  ? 5/9- plts 187k- con't lovenox ? 5/11- will need lovenox for a total of 3 months from SCI- so ~ 03/26/22 ? ?  Latest Ref Rng & Units 01/12/2022  ?  5:31 AM 01/09/2022  ?  6:20 AM 01/08/2022  ?  5:22 AM  ?CBC  ?WBC 4.0 - 10.5 K/uL 2.4   14.8   15.6    ?Hemoglobin 13.0 - 17.0 g/dL 9.3   8.5   8.4    ?Hematocrit 39.0 - 52.0 % 28.6   25.8   25.5    ?Platelets 150 - 400 K/uL 187   220   211    ?  ? -antiplatelet therapy:N/A ?3. Pain Management/Post-Op: Start Tylenol 650 mg 3 times daily.  Oxycodone as needed. ?4. Mood: Team to provide a good support.  May need antidepressant-family and patient some disappointed and had multiple questions anxiety with regards to recovery and interventions potentially seeking second opinion etc.  Discussed with Dr. Willaim Sheng specially on spinal cord pathology. ?-01/01/2022 we will start Celexa 20 mg daily for mood. ?-4/29 Mood appropriate today. ?5/10- more sad today, but said he's "tolerable".  ?-  Antipsychotic agents: Not applicable ?5. Neuropsych: This patient may be capable of making decisions on his own behalf. ?6. Skin/Wound Care: Routine pressure relief measures.  ?            --Gerhadt's cream to scrotal breakdown ?           -  4/30-Continue use of cream as protective barrier. ?5/10- is healing per staff- con't regimen  ? ?7. Fluids/Electrolytes/Nutrition: Routine in and outs with follow-up chemistries ?8.  History of BPH/urinary retention.  Continue to hold Hytrin and Flomax for now. ?- Monitor BP for any orthostatic changes/drops. ?-4/30 Pending orthostatic vitals.  Pt voiding adequately s/p Foley removal. ?9.  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     -Has varied from 100 down to 44 down to 56 ?-4/29 Plts today 132, up from 73 on 4/27, continue to trend to    ensure stays above 100K ?-4/30 Plts at 154 today, continue to trend with am labs ?5/1- Plts 206- start Lovenox ?5/6 patient to continue Lovenox no bleeding issues. ? 5/9- Plts 187k- doing well- cont' lovneox ? 5/11- use lovenox until 03/26/22 to prevetn DVT then can stop ?10.  ABLA: Monitor H/H for stability. ?- Improved from 5.7-10.  Recheck CBC in AM. ?-01/01/22 hemoglobin 9.1. ?-01/03/22 today hemoglobin has dropped to 7.0 there are no obvious signs of bleeding.  Patient does not report seeing visible blood in stool or urine.  New orders for stool guaiac x3 UA to rule out blood in urine repeat H&H and orthostatic blood pressure to monitor for symptoms. ?-4/29 U/A negative for blood in urine. ?-4/30 Hgb up to 7.4 today from 7.0 yesterday, CBC in am to trend. Pending stool guaiac.  ?5/1- Hb 8.1 so don't know why it dropped so much- con't to monitor ?5/4- HGB 8.4 today, appears to be stable, continue to monitor ? 5/5 hgb stable at 8.5, continue to monitor ? 5/6 we will continue to trend hemoglobin on q. Monday labs ?5/9- Hb up to 9.3- cont' to monitor ? ?  Latest Ref Rng & Units 01/12/2022  ?  5:31 AM 01/09/2022  ?  6:20 AM  01/08/2022  ?  5:22 AM  ?CBC  ?WBC 4.0 - 10.5 K/uL 2.4   14.8   15.6    ?Hemoglobin 13.0 - 17.0 g/dL 9.3   8.5   8.4    ?Hematocrit 39.0 - 52.0 % 28.6   25.8   25.5    ?Platelets 150 - 400 K/uL 187   220   211    ?  ?11.

## 2022-01-16 NOTE — Progress Notes (Signed)
Occupational Therapy Session Note ? ?Patient Details  ?Name: Allen Arias ?MRN: [id number] ?Date of Birth: 1940-09-16 ? ?Today's Date: 01/16/2022 ?OT Individual Time: 7793-9030 ?OT Individual Time Calculation (min): 70 min  ? ? ?Short Term Goals: ?Week 1:  OT Short Term Goal 1 (Week 1): Pt will sit EOB dynamically with no more than MIN A for 5 min ?OT Short Term Goal 1 - Progress (Week 1): Met ?OT Short Term Goal 2 (Week 1): Pt will compeltes SB transfer to [facility] wiht MOD A ?OT Short Term Goal 2 - Progress (Week 1): Progressing toward goal ?OT Short Term Goal 3 (Week 1): Pt will maintain circle sitting wiht MIN A in prep for LB dressing for 5 min ?OT Short Term Goal 3 - Progress (Week 1): Progressing toward goal ?Week 2:  OT Short Term Goal 1 (Week 2): Pt will compeltes SB transfer to [facility] wiht MOD A ?OT Short Term Goal 1 - Progress (Week 2): Met ?OT Short Term Goal 2 (Week 2): Pt will maintain circle sitting wiht MIN A in prep for LB dressing for 5 min ?OT Short Term Goal 2 - Progress (Week 2): Met ?OT Short Term Goal 3 (Week 2): Pt will complete UB dressing with min A seated EOB ?OT Short Term Goal 3 - Progress (Week 2): Met ?OT Short Term Goal 4 (Week 2): Pt will perform supine>sit EOB with min A in preparation for transfers ?OT Short Term Goal 4 - Progress (Week 2): Met ?Week 3:  OT Short Term Goal 1 (Week 3): Pt will complete LB dressing at bed level or sitting EOB with max A ?OT Short Term Goal 2 (Week 3): Pt will perform DABSC transfers with min A (SB or scoot) ?OT Short Term Goal 3 (Week 3): Pt will perform clothing mgmt tasks seated on DABSC with max A ? ?Skilled Therapeutic Interventions/Progress Updates:  ?  Pt received seated on mat in gym passed off from PT, reports improving surgery site pain and no request for intervention at this time, agreeable to therapy.  [place] interpreter via ipad utilized throughout session.  ? ?Pt participated in various therex to promote BUE/core strength + anterior weight shift +  improved static/dynamic sitting balance in prep for improved ADL/functional mobility performance and pressure relief to BLE: ? -2x10 cross body twists with 4 lb medicine ball ? -2x10 modified sit ups with use of wedge and 4 lb medicine ball ? -forward towel slides on side table ? -scooting up and down mat, cues for increased clearance and to utilize BLE to push ? -1x5 w/c push ups and hold for 5 seconds each, very good clearance and use of BLE to push up ? ? ?Practice crossing BLE to practice placing SB himself. Easier to cross RLE over LLE, but still requires min to mod A from therapist to achieve. Reports he was able to cross BLE with ease prior to surgery. Pt able to place SB under his RLE with min A to fully push under his leg. SB transfer with min A to stabilize SB throughout.  ? ?Self-propelled w/c back to room with increased time and close S. Electing to stay in w/c as family is coming to visit. Total A to doff B leg lifts per pt request. ? ?Pt left seated in w/c with safety belt alarm engaged, call bell in reach, and all immediate needs met.  ? ? ?Therapy Documentation ?Precautions:  ?Precautions ?Precautions: Fall ?Precaution Comments: foley ?Restrictions ?Weight Bearing Restrictions: No ? ?Pain: see  session note ?  ?ADL: See Care Tool for more details. ? ? ?Therapy/Group: Individual Therapy ? ?Volanda Napoleon MS, OTR/L ? ?01/16/2022, 6:56 AM ?

## 2022-01-17 NOTE — Progress Notes (Signed)
Continued bowel program from previous shift with small stool out from rectum. Pt tolerated well. ?

## 2022-01-17 NOTE — Progress Notes (Signed)
Dig Stim performed this morning to empty rectum. No bm recorded. Pt tolerated well     ?

## 2022-01-17 NOTE — Progress Notes (Signed)
PROGRESS NOTE   Subjective/Complaints: Patient seen at bedside. Reports sleeping well with Ambien.  However, reports come concern over bowel incontinence. Still awaiting discussion with family wrt whether they would like for him to have a Foley catheter.  ROS: Limited by language.  Use of interpreter services patient was able to state no CP, SOB, headache.   Objective:   No results found.  No results for input(s): WBC, HGB, HCT, PLT in the last 72 hours.  No results for input(s): NA, K, CL, CO2, GLUCOSE, BUN, CREATININE, CALCIUM in the last 72 hours.   Intake/Output Summary (Last 24 hours) at 01/17/2022 1447 Last data filed at 01/17/2022 1326 Gross per 24 hour  Intake 437 ml  Output --  Net 437 ml        Physical Exam:  Constitutional: Awake alert appropriate lying in bed.  No distress . Vital signs reviewed. HENT: Conjugate gaze, oropharynx moist.  Normocephalic.  Atraumatic. Eyes: EOMI. No discharge. Cardiovascular: RRR. No JVD. Respiratory: CTA Bilaterally. Normal effort.  No W/R/R good air movement movement GI: BS +. Non-distended. + BS, normoactive GU: Incontinence of urine, wearing brief, no change. Neurological: Alert son in room interpreting. Musc: No edema. 1/5 in RLE, 2-/5 in LLE Extremities: pitting LLE edema 1-2+ to L knee-trace on RLE Skin: Bilateral groin incision, flat, non-tender, non-swollen, skin glue, RLE incision: flat, non-tender, non-swollen, skin glue Psychiatric appropriate-flattened affect  Vital Signs Blood pressure 140/80, pulse 95, temperature 98.3 F (36.8 C), resp. rate 18, height 5\' 4"  (1.626 m), weight 52.4 kg, SpO2 99 %.    Assessment/Plan: 1. Functional deficits which require 3+ hours per day of interdisciplinary therapy in a comprehensive inpatient rehab setting. Physiatrist is providing close team supervision and 24 hour management of active medical problems listed  below. Physiatrist and rehab team continue to assess barriers to discharge/monitor patient progress toward functional and medical goals  Care Tool:  Bathing    Body parts bathed by patient: Right arm, Left arm, Chest, Abdomen, Front perineal area, Right upper leg, Left upper leg, Face   Body parts bathed by helper: Buttocks, Right lower leg, Left lower leg Body parts n/a: Front perineal area   Bathing assist Assist Level: Moderate Assistance - Patient 50 - 74%     Upper Body Dressing/Undressing Upper body dressing   What is the patient wearing?: Pull over shirt    Upper body assist Assist Level: Supervision/Verbal cueing    Lower Body Dressing/Undressing Lower body dressing      What is the patient wearing?: Pants, Incontinence brief     Lower body assist Assist for lower body dressing: Maximal Assistance - Patient 25 - 49%     Toileting Toileting    Toileting assist Assist for toileting: Dependent - Patient 0%     Transfers Chair/bed transfer  Transfers assist     Chair/bed transfer assist level: Minimal Assistance - Patient > 75%     Locomotion Ambulation   Ambulation assist   Ambulation activity did not occur: Safety/medical concerns          Walk 10 feet activity   Assist  Walk 10 feet activity did not occur: Safety/medical concerns  Walk 50 feet activity   Assist Walk 50 feet with 2 turns activity did not occur: Safety/medical concerns         Walk 150 feet activity   Assist Walk 150 feet activity did not occur: Safety/medical concerns         Walk 10 feet on uneven surface  activity   Assist Walk 10 feet on uneven surfaces activity did not occur: Safety/medical concerns         Wheelchair     Assist Is the patient using a wheelchair?: Yes Type of Wheelchair: Manual    Wheelchair assist level: Supervision/Verbal cueing Max wheelchair distance: 156ft    Wheelchair 50 feet with 2 turns  activity    Assist        Assist Level: Supervision/Verbal cueing   Wheelchair 150 feet activity     Assist      Assist Level: Dependent - Patient 0%   Blood pressure 140/80, pulse 95, temperature 98.3 F (36.8 C), resp. rate 18, height 5\' 4"  (1.626 m), weight 52.4 kg, SpO2 99 %.  Medical Problem List and Plan: 1. 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             - Patient may shower but incisions must be covered.              -ELOS/Goals: Min 10 to 14 days 5/10- Talked to daughter- will keep until find a SNF, but need to find soon- will likely need to d/c early next week.  2.  Antithrombotics: -DVT/anticoagulation: Mechanical: Sequential compression devices, below knee bilateral lower extremities - Check Dopplers in a.m. -01/01/2022 cannot be on Lovenox or Eliquis due to having low platelets with a risk of bleeding.  However if his platelets get above 100 K may consider starting.   -4/27 Vas [place]: Negative for DVT bilateral -4/29 Continue SCD's, while trending Plts -4/29 Plts today 132, up from 73 on 4/27, continue to trend to    ensure stays above 100K -4/30 Plts at 154 today, continue to trend with am labs 5/1- plts 206- will start Lovenox since high risk for DVT and recheck labs Thursday.  5/5 plts 220- continue to monitor 5/6 no new labs but will trend platelets with the q. Monday labs. 5/9- plts 187k- con't lovenox 5/11- will need lovenox for a total of 3 months from [facility]- so ~ 03/26/22    Latest Ref Rng & Units 01/12/2022    5:31 AM 01/09/2022    6:20 AM 01/08/2022    5:22 AM  CBC  WBC 4.0 - 10.5 K/uL 2.4   14.8   15.6    Hemoglobin 13.0 - 17.0 g/dL 9.3   8.5   8.4    Hematocrit 39.0 - 52.0 % 28.6   25.8   25.5    Platelets 150 - 400 K/uL 187   220   211       -antiplatelet therapy:N/A 3. Pain Management/Post-Op: Start Tylenol 650 mg 3 times daily.  Oxycodone as needed. 4. Mood: Team to provide a good support.  May need  antidepressant-family and patient some disappointed and had multiple questions anxiety with regards to recovery and interventions potentially seeking second opinion etc.  Discussed with Dr. Johnsie Cancel specially on spinal cord pathology. -01/01/2022 we will start Celexa 20 mg daily for mood. -4/29 Mood appropriate today. 5/10- more sad today, but said he's "tolerable".  - Antipsychotic agents: Not applicable 5. Neuropsych: This patient may be capable of making  decisions on his own behalf. 6. Skin/Wound Care: Routine pressure relief measures.              --Gerhadt's cream to scrotal breakdown            -4/30-Continue use of cream as protective barrier. 5/10- is healing per staff- con't regimen  7. Fluids/Electrolytes/Nutrition: Routine in and outs with follow-up chemistries -4/29 continue to check Cmet 8.  History of BPH/urinary retention.  Continue to hold Hytrin and Flomax for now. - Monitor BP for any orthostatic changes/drops. -4/30 Pending orthostatic vitals.  Pt voiding adequately s/p Foley removal. 9.  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    5/9- Plts 187k- doing well- cont' lovneox             5/11- use lovenox until 03/26/22 to prevent DVT then can stop 5/13 Continue plan above. 10.  ABLA: Monitor H/H for stability. - Improved from 5.7-10.  Recheck CBC in AM. -01/01/22 hemoglobin 9.1. -01/03/22 today hemoglobin has dropped to 7.0 there are no obvious signs of bleeding.  Patient does not report seeing visible blood in stool or urine.  New orders for stool guaiac x3 UA to rule out blood in urine repeat H&H and orthostatic blood pressure to monitor for symptoms. -4/29 U/A negative for blood in  urine. -4/30 Hgb up to 7.4 today from 7.0 yesterday, CBC in am to trend. Pending stool guaiac.  5/1- Hb 8.1 so don't know why it dropped so much- con't to monitor 5/4- HGB 8.4 today, appears to be stable, continue to monitor  5/5 hgb stable at 8.5, continue to monitor  5/6 we will continue to trend hemoglobin on q. Monday labs 5/9- Hb up to 9.3- cont' to monitor    Latest Ref Rng & Units 01/12/2022    5:31 AM 01/09/2022    6:20 AM 01/08/2022    5:22 AM  CBC  WBC 4.0 - 10.5 K/uL 2.4   14.8   15.6    Hemoglobin 13.0 - 17.0 g/dL 9.3   8.5   8.4    Hematocrit 39.0 - 52.0 % 28.6   25.8   25.5    Platelets 150 - 400 K/uL 187   220   211      11.  Insomnia: Slept well last night with current trazodone.  Continue as needed.  -4/30 Pt reports that trazodone ineffective for sleep.  Trial of 3 mg melatonin to if this works better for the patient. 5/3- will change to Ambien 5 mg QHS- due ot age, cannot do a higher dose.              5/4 insomnia improved last night             5/5 insomnia continues to be improved, follow            5/6 patient says slept well with current medication of Ambien 5 mg nightly continue this plan. 5/10- sleeping well with Ambien 5/13 No issues with sleep. 12.  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 - Check KUB for constipation/stool burden burden. - 01/01/22-start  bowel program q. nightly with digital stimulation with suppository.  Educated patient and son on bowel program. 4/28-has not been eating but has had small bowel movement with the bowel program.   -4/29 has had stool today -4/30 No constipation reported, LBM 4/30 5/1- not clear if had BM/bowel program last night- have called nursing to figure out what's going on with bowel program             5/2- BM last night with program- will move to q2 days since pt says at home was q2-4 days would have BM- 5/6 patient reports BM 2 days ago which is not uncommon for his normal.   Continue bowel program as prescribed.  5/10- good results with bowel program QOD 5/13 Continue bowel program, reports BM 2 days ago. 13.  Neurogenic bladder: Had Foley due to urinary retention and low blood pressure -01/01/2022 plans to remove Foley in the morning on 428 and start Flomax 0.4 mg q. nightly because he was having low blood pressure up until now-we will monitor and stop if BP goes low again. -01/02/2022 Foley was removed around lunchtime. -01/03/2022 patient has voided twice since Foley removal however because patient is incontinent these were recorded as an occurrence instead of a volume.  Patient family and nurse said that he has been able to void without issue since Foley has been removed. -4/30 Patient continues to void but is incontinent r/t neurogenic bladder    5/1- think voiding is overflow- need bladder scans to be clear.     5/2- change bladder scans to q4 hours- and see if can void with urinal?    5/3- voiding incontinently- worried will have more skin breakdown- will stop 5/6 continue to monitor voiding with of every 4 hour bladder scans patient is not reporting issues with voiding today however continue to monitor for possible overflow. /9- voiding but incontinent- I think it's overflow- but not retaining much- less than 300cc when scanned. 5/11- d/w colleague in [facility]- pt doesn't want foley/"tube" because will stop him from regaining bladder function, even though discussed don't know if/when that would occur.  Thought about flomax to make sure empties better; vs Myrbetriq, but no bladder scans or in/out at nursing homes. D/w colleague more before making a decision- will send ot Urology outpatient after d/c.  5/12- discussed with pt- Foley- because of constant incontinence- concern for skin breakdown.  5/13 Tried to reach family but only came on-site for brief period, with attempt to reach out to family again.  Patient says "he will think about it" wrt Foley. 14.  Azotemia -  01/01/2022 BUN 38 was 21-we will give IVF's-50 cc/h and recheck in a.m. if not better tomorrow, will not remove Foley. 01/02/2022 BUN down to 33-we will continue IVF's for the rest of the day today and remove Foley this afternoon patient advised to drink more and recheck labs Monday morning 01/05/2022 -4/29 BUN trending down at 33 (4/28) from 38 on 4/27 -4/30 qMonday labs to trend 5/1- BUN down to 24- con't to push fluids 5/4 BUN down to 23, continue to monitor 5/6 BUN at 24 creatinine at 0.81 encourage fluids adequate hydration. 5/7 trend with qMonday labs. 5/9- BUN 25- slightly dry but doesn't want IVFs- will push PO 5/13 Pending qMonday labs.     Latest Ref Rng & Units 01/12/2022    5:31 AM 01/09/2022    6:20 AM 01/08/2022    5:22 AM  BMP  Glucose 70 - 99 mg/dL 96  96   95    BUN 8 - 23 mg/dL Creatinine 0.61 - 1.24 mg/dL 9.56   2.13   0.86    Sodium 135 - 145 mmol/L 133   134   135    Potassium 3.5 - 5.1 mmol/L 4.1   3.8   3.8    Chloride 98 - 111 mmol/L 103   104   104    CO2 22 - 32 mmol/L Calcium 8.9 - 10.3 mg/dL 8.6   8.4   8.3      15.  Cholelithiasis-  -4/28 no acute cholecystitis 16. LE edema- nonpitting             5/3- will reorder TEDs- thigh highs. For during day.              5/4- OT to provide teds today             5/6 continue to implement TEDs with help from OT             5/7 Worked with PT today, continue use of TED hose. 17.UTI             -5/5 Keflex was started for treatment             -Follow culture results           5/6 continue Keflex culture results are still pending we will follow up once these are posted. 5/7 Urine culture back, >10,000 E. Coli, pan sensitive.  Since Keflex good for E. Coli will continue current treatment.             5/9- PO ABX for 7 days due to complicated UTI             5/12- finished Po ABX   LOS: 17 days A FACE TO FACE EVALUATION WAS PERFORMED  Tressia Miners, FNP 01/17/2022, 2:47 PM

## 2022-01-17 NOTE — Progress Notes (Signed)
Occupational Therapy Session Note ? ?Patient Details  ?Name: Allen Arias ?MRN: [id number] ?Date of Birth: 20-Jun-1941 ? ?Today's Date: 01/17/2022 ?OT Individual Time: 6387-5643 ?OT Individual Time Calculation (min): 64 min  ? ? ?Short Term Goals: ?Week 3:  OT Short Term Goal 1 (Week 3): Pt will complete LB dressing at bed level or sitting EOB with max A ?OT Short Term Goal 2 (Week 3): Pt will perform DABSC transfers with min A (SB or scoot) ?OT Short Term Goal 3 (Week 3): Pt will perform clothing mgmt tasks seated on DABSC with max A ? ?Skilled Therapeutic Interventions/Progress Updates:  ?  Patient received supine in bed.  Attempted use of virtual interpreter however significant delay - attempted audio interpreter and again long delay.  Patient indicates he can understand - stating - "speak slowly and simply and clearly"  Patient also following gestures well.   ?Patient declined full bath - in clean brief, pants pulled up to hips and shirt. Patient able to roll L/R to pull pants up over hips.  Worked in long sitting, toward circle sitting to allow patient to assist in pulling up TED hose, and donning leg loops.  Patient able to close thigh portion with increased time.  Patient transitioned to sitting, and assisted to don shoes.  Transferred with slide board to wheelchair with min assist toward contact guard assist.  To sink for hygiene, declined need for bathroom.  Practiced drop arm commode transfer to/from, slightly inclined surface with mod assist.  This was patient's only scheduled therapy today, and patient requested to return to bed.   ?Bed alarm reset, table with personal items, and call bell in reach.   ? ?Therapy Documentation ?Precautions:  ?Precautions ?Precautions: Fall ?Precaution Comments: foley ?Restrictions ?Weight Bearing Restrictions: No ?  ?Pain: ?Pain Assessment ?Pain Scale: 0-10 ?Pain Score: 0-No pain ? ? ? ? ?Therapy/Group: Individual Therapy ? ?Collier Salina ?01/17/2022, 11:35 AM ?

## 2022-01-18 MED ORDER — BUTALBITAL-APAP-CAFFEINE 50-325-40 MG PO TABS
1.0000 | ORAL_TABLET | Freq: Four times a day (QID) | ORAL | Status: DC | PRN
  Administered 2022-01-18 – 2022-01-24 (×3): 1 via ORAL
  Filled 2022-01-18 (×3): qty 1

## 2022-01-18 NOTE — Progress Notes (Addendum)
PROGRESS NOTE   Subjective/Complaints: Patient seen at bedside. Reports sleeping well with Ambien.  However, reports come concern over bowel incontinence. Still awaiting discussion with family wrt whether they would like for him to have a Foley catheter. Daughter would like doctor to discuss with her.  ROS: Limited by language.  Use of interpreter services patient was able to state no CP, SOB. Does report headache today, says today that he has had headaches several nights while here.   Objective:   No results found.  No results for input(s): WBC, HGB, HCT, PLT in the last 72 hours.  No results for input(s): NA, K, CL, CO2, GLUCOSE, BUN, CREATININE, CALCIUM in the last 72 hours.   Intake/Output Summary (Last 24 hours) at 01/18/2022 1223 Last data filed at 01/18/2022 0853 Gross per 24 hour  Intake 420 ml  Output --  Net 420 ml        Physical Exam:  Constitutional: Awake alert appropriate lying in bed.  No distress . Vital signs reviewed. HENT: Conjugate gaze, oropharynx moist.  Normocephalic.  Atraumatic. Eyes: EOMI. No discharge. Cardiovascular: RRR. No JVD. Respiratory: CTA Bilaterally. Normal effort.  No W/R/R good air movement movement GI: BS +. Non-distended. + BS, normoactive GU: Incontinence of urine, wearing brief. Neurological: Alert son in room interpreting. Musc: No edema. 1/5 in RLE, 2-/5 in LLE Extremities: pitting LLE edema 1-2+ to L knee-trace on RLE Skin: Bilateral groin incision, flat, non-tender, non-swollen, skin glue, RLE incision: flat, non-tender, non-swollen, skin glue Psychiatric appropriate-flattened affect  Vital Signs Blood pressure 124/68, pulse 72, temperature 98.2 F (36.8 C), resp. rate 15, height 5\' 4"  (1.626 m), weight 52.4 kg, SpO2 98 %.    Assessment/Plan: 1. Functional deficits which require 3+ hours per day of interdisciplinary therapy in a comprehensive inpatient rehab  setting. Physiatrist is providing close team supervision and 24 hour management of active medical problems listed below. Physiatrist and rehab team continue to assess barriers to discharge/monitor patient progress toward functional and medical goals  Care Tool:  Bathing    Body parts bathed by patient: Right arm, Left arm, Chest, Abdomen, Front perineal area, Right upper leg, Left upper leg, Face   Body parts bathed by helper: Buttocks, Right lower leg, Left lower leg Body parts n/a: Front perineal area   Bathing assist Assist Level: Moderate Assistance - Patient 50 - 74%     Upper Body Dressing/Undressing Upper body dressing   What is the patient wearing?: Pull over shirt    Upper body assist Assist Level: Supervision/Verbal cueing    Lower Body Dressing/Undressing Lower body dressing      What is the patient wearing?: Pants, Incontinence brief     Lower body assist Assist for lower body dressing: Maximal Assistance - Patient 25 - 49%     Toileting Toileting    Toileting assist Assist for toileting: Dependent - Patient 0%     Transfers Chair/bed transfer  Transfers assist     Chair/bed transfer assist level: Minimal Assistance - Patient > 75%     Locomotion Ambulation   Ambulation assist   Ambulation activity did not occur: Safety/medical concerns  Walk 10 feet activity   Assist  Walk 10 feet activity did not occur: Safety/medical concerns        Walk 50 feet activity   Assist Walk 50 feet with 2 turns activity did not occur: Safety/medical concerns         Walk 150 feet activity   Assist Walk 150 feet activity did not occur: Safety/medical concerns         Walk 10 feet on uneven surface  activity   Assist Walk 10 feet on uneven surfaces activity did not occur: Safety/medical concerns         Wheelchair     Assist Is the patient using a wheelchair?: Yes Type of Wheelchair: Manual    Wheelchair assist  level: Supervision/Verbal cueing Max wheelchair distance: 1103ft    Wheelchair 50 feet with 2 turns activity    Assist        Assist Level: Supervision/Verbal cueing   Wheelchair 150 feet activity     Assist      Assist Level: Dependent - Patient 0%   Blood pressure 124/68, pulse 72, temperature 98.2 F (36.8 C), resp. rate 15, height  (1.626 m), weight 52.4 kg, SpO2 98 %.  Medical Problem List and Plan: 1. 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             - Patient may shower but incisions must be covered.              -ELOS/Goals: Min 10 to 14 days 5/10- Talked to daughter- will keep until find a SNF, but need to find soon- will likely need to d/c early next week.  2.  Antithrombotics: -DVT/anticoagulation: Mechanical: Sequential compression devices, below knee bilateral lower extremities - Check Dopplers in a.m. -01/01/2022 cannot be on Lovenox or Eliquis due to having low platelets with a risk of bleeding.  However if his platelets get above 100 K may consider starting.   -4/27 Vas [place]: Negative for DVT bilateral -4/29 Continue SCD's, while trending Plts -4/29 Plts today 132, up from 73 on 4/27, continue to trend to    ensure stays above 100K -4/30 Plts at 154 today, continue to trend with am labs 5/1- plts 206- will start Lovenox since high risk for DVT and recheck labs Thursday.  5/5 plts 220- continue to monitor 5/6 no new labs but will trend platelets with the q. Monday labs. 5/9- plts 187k- con't lovenox 5/11- will need lovenox for a total of 3 months from [facility]- so ~ 03/26/22    Latest Ref Rng & Units 01/12/2022    5:31 AM 01/09/2022    6:20 AM 01/08/2022    5:22 AM  CBC  WBC 4.0 - 10.5 K/uL 2.4   14.8   15.6    Hemoglobin 13.0 - 17.0 g/dL 9.3   8.5   8.4    Hematocrit 39.0 - 52.0 % 28.6   25.8   25.5    Platelets 150 - 400 K/uL 187   220   211       -antiplatelet therapy:N/A 3. Pain Management/Post-Op: Start Tylenol 650 mg 3  times daily.  Oxycodone as needed. 4. Mood: Team to provide a good support.  May need antidepressant-family and patient some disappointed and had multiple questions anxiety with regards to recovery and interventions potentially seeking second opinion etc.  Discussed with Dr. Johnsie Cancel specially on spinal cord pathology. -01/01/2022 we will start Celexa 20 mg daily for mood. -4/29 Mood appropriate  today. 5/10- more sad today, but said he's "tolerable".  5/14 Okay spirits today. - Antipsychotic agents: Not applicable 5. Neuropsych: This patient may be capable of making decisions on his own behalf. 6. Skin/Wound Care: Routine pressure relief measures.              --Gerhadt's cream to scrotal breakdown            -4/30-Continue use of cream as protective barrier. 5/10- is healing per staff- con't regimen  5/14 improved per nursing, continue barrier cream. 7. Fluids/Electrolytes/Nutrition: Routine in and outs with follow-up chemistries -4/29 continue to check Cmet 8.  History of BPH/urinary retention.  Continue to hold Hytrin and Flomax for now. - Monitor BP for any orthostatic changes/drops. -4/30 Pending orthostatic vitals.  Pt voiding adequately s/p Foley removal. 9.  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    5/9- Plts 187k- doing well- cont' lovneox             5/11- use lovenox until 03/26/22 to prevent DVT then can stop 5/13 Continue plan above. 10.  ABLA: Monitor H/H for stability. - Improved from 5.7-10.  Recheck CBC in AM. -01/01/22 hemoglobin 9.1. -01/03/22 today hemoglobin has dropped to 7.0 there are no obvious signs of bleeding.  Patient does not report seeing visible blood in stool or urine.   New orders for stool guaiac x3 UA to rule out blood in urine repeat H&H and orthostatic blood pressure to monitor for symptoms. -4/29 U/A negative for blood in urine. -4/30 Hgb up to 7.4 today from 7.0 yesterday, CBC in am to trend. Pending stool guaiac.  5/1- Hb 8.1 so don't know why it dropped so much- con't to monitor 5/4- HGB 8.4 today, appears to be stable, continue to monitor  5/5 hgb stable at 8.5, continue to monitor  5/6 we will continue to trend hemoglobin on q. Monday labs 5/9- Hb up to 9.3- cont' to monitor 5/14 Continue to trend with qMonday labs, daughter asked about labs.  Let her know that he gets weekly labs.    Latest Ref Rng & Units 01/12/2022    5:31 AM 01/09/2022    6:20 AM 01/08/2022    5:22 AM  CBC  WBC 4.0 - 10.5 K/uL 2.4   14.8   15.6    Hemoglobin 13.0 - 17.0 g/dL 9.3   8.5   8.4    Hematocrit 39.0 - 52.0 % 28.6   25.8   25.5    Platelets 150 - 400 K/uL 187   220   211      11.  Insomnia: Slept well last night with current trazodone.  Continue as needed.  -4/30 Pt reports that trazodone ineffective for sleep.  Trial of 3 mg melatonin to if this works better for the patient. 5/3- will change to Ambien 5 mg QHS- due ot age, cannot do a higher dose.              5/4 insomnia improved last night             5/5 insomnia continues to be improved, follow  5/6 patient says slept well with current medication of Ambien 5 mg nightly continue this plan. 5/10- sleeping well with Ambien 5/13 No issues with sleep. 5/14 Sleeping well. 12.  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 - Check KUB for constipation/stool burden burden. - 01/01/22-start bowel program q. nightly with digital stimulation with suppository.  Educated patient and son on bowel program. 4/28-has not been eating but has had small bowel movement with the bowel program.   -4/29 has had stool today -4/30 No constipation reported, LBM 4/30 5/1- not clear if  had BM/bowel program last night- have called nursing to figure out what's going on with bowel program             5/2- BM last night with program- will move to q2 days since pt says at home was q2-4 days would have BM- 5/6 patient reports BM 2 days ago which is not uncommon for his normal.  Continue bowel program as prescribed.  5/10- good results with bowel program QOD 5/13 Continue bowel program, reports BM 2 days ago. 5/14 LBM 01/17/22 13.  Neurogenic bladder: Had Foley due to urinary retention and low blood pressure -01/01/2022 plans to remove Foley in the morning on 428 and start Flomax 0.4 mg q. nightly because he was having low blood pressure up until now-we will monitor and stop if BP goes low again. -01/02/2022 Foley was removed around lunchtime. -01/03/2022 patient has voided twice since Foley removal however because patient is incontinent these were recorded as an occurrence instead of a volume.  Patient family and nurse said that he has been able to void without issue since Foley has been removed. -4/30 Patient continues to void but is incontinent r/t neurogenic bladder    5/1- think voiding is overflow- need bladder scans to be clear.     5/2- change bladder scans to q4 hours- and see if can void with urinal?    5/3- voiding incontinently- worried will have more skin breakdown- will stop 5/6 continue to monitor voiding with of every 4 hour bladder scans patient is not reporting issues with voiding today however continue to monitor for possible overflow. /9- voiding but incontinent- I think it's overflow- but not retaining much- less than 300cc when scanned. 5/11- d/w colleague in [facility]- pt doesn't want foley/"tube" because will stop him from regaining bladder function, even though discussed don't know if/when that would occur.  Thought about flomax to make sure empties better; vs Myrbetriq, but no bladder scans or in/out at nursing homes. D/w colleague more before making a decision- will send  ot Urology outpatient after d/c.  5/12- discussed with pt- Foley- because of constant incontinence- concern for skin breakdown.  5/13 Tried to reach family but only came on-site for brief period, with attempt to reach out to family again.  Patient says "he will think about it" wrt Foley. 5/14 Daughter present at bedside but has some additional questions while considering Foley, specific to neurogenic bladder.  Requesting doctor rounding on Monday to reach out to her. 14.  Azotemia - 01/01/2022 BUN 38 was 21-we will give IVF's-50 cc/h and recheck in a.m. if not better tomorrow, will not remove Foley. 01/02/2022 BUN down to 33-we will continue IVF's for the rest of the day today and remove Foley this afternoon patient advised to drink more and recheck labs Monday morning 01/05/2022 -4/29 BUN trending down at 33 (4/28) from 38 on 4/27 -4/30 qMonday labs to trend 5/1-  BUN down to 24- con't to push fluids 5/4 BUN down to 23, continue to monitor 5/6 BUN at 24 creatinine at 0.81 encourage fluids adequate hydration. 5/7 trend with qMonday labs. 5/9- BUN 25- slightly dry but doesn't want IVFs- will push PO 5/13 Pending qMonday labs.     Latest Ref Rng & Units 01/12/2022    5:31 AM 01/09/2022    6:20 AM 01/08/2022    5:22 AM  BMP  Glucose 70 - 99 mg/dL 96   96   95    BUN 8 - 23 mg/dL 25   24   23     Creatinine 0.61 - 1.24 mg/dL 1.61   0.96   0.45    Sodium 135 - 145 mmol/L 133   134   135    Potassium 3.5 - 5.1 mmol/L 4.1   3.8   3.8    Chloride 98 - 111 mmol/L 103   104   104    CO2 22 - 32 mmol/L 27   23   24     Calcium 8.9 - 10.3 mg/dL 8.6   8.4   8.3      15.  Cholelithiasis-  -4/28 no acute cholecystitis 16. LE edema- nonpitting             5/3- will reorder TEDs- thigh highs. For during day.              5/4- OT to provide teds today             5/6 continue to implement TEDs with help from OT             5/7 Worked with PT today, continue use of TED hose. 17.UTI             -5/5 Keflex was  started for treatment             -Follow culture results           5/6 continue Keflex culture results are still pending we will follow up once these are posted. 5/7 Urine culture back, >10,000 E. Coli, pan sensitive.  Since Keflex good for E. Coli will continue current treatment.             5/9- PO ABX for 7 days due to complicated UTI             5/12- finished Po ABX 18. Headaches 5/14 Patient reports today that he has had nighttime headaches, several nights while here in Rehab.   -He has had no unusual spikes in b/p, denies any other symptoms occurring with his headaches. -He has been given Tylenol, but this hasn't worked well.  Requested Alleve, but did have one elevated Crt 1.30 (4/22).  NSAID's may be safe, 3 weeks out, and Crt normalized.  But will start a trial of Fioricet q6h prn first.   LOS: 18 days A FACE TO FACE EVALUATION WAS PERFORMED  Tressia Miners, FNP 01/18/2022, 12:23 PM

## 2022-01-19 DIAGNOSIS — F329 Major depressive disorder, single episode, unspecified: Secondary | ICD-10-CM

## 2022-01-19 DIAGNOSIS — R519 Headache, unspecified: Secondary | ICD-10-CM

## 2022-01-19 DIAGNOSIS — R29898 Other symptoms and signs involving the musculoskeletal system: Secondary | ICD-10-CM

## 2022-01-19 DIAGNOSIS — D539 Nutritional anemia, unspecified: Secondary | ICD-10-CM

## 2022-01-19 DIAGNOSIS — K8020 Calculus of gallbladder without cholecystitis without obstruction: Secondary | ICD-10-CM

## 2022-01-19 DIAGNOSIS — R630 Anorexia: Secondary | ICD-10-CM

## 2022-01-19 DIAGNOSIS — N390 Urinary tract infection, site not specified: Secondary | ICD-10-CM

## 2022-01-19 DIAGNOSIS — F32A Depression, unspecified: Secondary | ICD-10-CM

## 2022-01-19 DIAGNOSIS — N319 Neuromuscular dysfunction of bladder, unspecified: Secondary | ICD-10-CM

## 2022-01-19 DIAGNOSIS — K592 Neurogenic bowel, not elsewhere classified: Secondary | ICD-10-CM

## 2022-01-19 HISTORY — DX: Urinary tract infection, site not specified: N39.0

## 2022-01-19 LAB — CBC
HCT: 24.4 % — ABNORMAL LOW (ref 39.0–52.0)
Hemoglobin: 7.7 g/dL — ABNORMAL LOW (ref 13.0–17.0)
MCH: 32.6 pg (ref 26.0–34.0)
MCHC: 31.6 g/dL (ref 30.0–36.0)
MCV: 103.4 fL — ABNORMAL HIGH (ref 80.0–100.0)
Platelets: 213 10*3/uL (ref 150–400)
RBC: 2.36 MIL/uL — ABNORMAL LOW (ref 4.22–5.81)
RDW: 18.8 % — ABNORMAL HIGH (ref 11.5–15.5)
WBC: 5.6 10*3/uL (ref 4.0–10.5)
nRBC: 0 % (ref 0.0–0.2)

## 2022-01-19 LAB — COMPREHENSIVE METABOLIC PANEL
ALT: 27 U/L (ref 0–44)
AST: 23 U/L (ref 15–41)
Albumin: 2.9 g/dL — ABNORMAL LOW (ref 3.5–5.0)
Alkaline Phosphatase: 41 U/L (ref 38–126)
Anion gap: 7 (ref 5–15)
BUN: 27 mg/dL — ABNORMAL HIGH (ref 8–23)
CO2: 24 mmol/L (ref 22–32)
Calcium: 8.5 mg/dL — ABNORMAL LOW (ref 8.9–10.3)
Chloride: 106 mmol/L (ref 98–111)
Creatinine, Ser: 0.78 mg/dL (ref 0.61–1.24)
GFR, Estimated: >60 mL/min (ref >60)
Glucose, Bld: 88 mg/dL (ref 70–99)
Potassium: 3.9 mmol/L (ref 3.5–5.1)
Sodium: 137 mmol/L (ref 135–145)
Total Bilirubin: 1.9 mg/dL — ABNORMAL HIGH (ref 0.3–1.2)
Total Protein: 5.1 g/dL — ABNORMAL LOW (ref 6.5–8.1)

## 2022-01-19 MED ORDER — ACETAMINOPHEN 325 MG PO TABS: 650.0000 mg | ORAL_TABLET | Freq: Three times a day (TID) | ORAL | Status: AC

## 2022-01-19 MED ORDER — CITALOPRAM HYDROBROMIDE 20 MG PO TABS: 20.0000 mg | ORAL_TABLET | Freq: Every day | ORAL | Status: AC

## 2022-01-19 MED ORDER — ENSURE ENLIVE PO LIQD: 237.0000 mL | Freq: Three times a day (TID) | ORAL | 12 refills | Status: DC

## 2022-01-19 MED ORDER — ENOXAPARIN SODIUM 40 MG/0.4ML IJ SOSY
40.0000 mg | PREFILLED_SYRINGE | INTRAMUSCULAR | Status: DC
Start: 2022-01-20 — End: 2022-02-23

## 2022-01-19 MED ORDER — CALCIUM POLYCARBOPHIL 625 MG PO TABS: 625.0000 mg | ORAL_TABLET | Freq: Two times a day (BID) | ORAL | Status: AC

## 2022-01-19 MED ORDER — POLYETHYLENE GLYCOL 3350 17 G PO PACK: 17.0000 g | PACK | Freq: Every day | ORAL | 0 refills | Status: AC | PRN

## 2022-01-19 MED ORDER — VITAMIN D3 25 MCG PO TABS: 1000.0000 [IU] | ORAL_TABLET | Freq: Every day | ORAL | Status: AC

## 2022-01-19 MED ORDER — MELATONIN 3 MG PO TABS: 3.0000 mg | ORAL_TABLET | Freq: Every day | ORAL | 0 refills | Status: AC

## 2022-01-19 MED ORDER — TOPIRAMATE 25 MG PO TABS
25.0000 mg | ORAL_TABLET | Freq: Every day | ORAL | Status: DC
  Administered 2022-01-19 – 2022-01-25 (×7): 25 mg via ORAL
  Filled 2022-01-19 (×7): qty 1

## 2022-01-19 MED ORDER — BISACODYL 10 MG RE SUPP: 10.0000 mg | RECTAL | 0 refills | Status: AC

## 2022-01-19 MED ORDER — DUTASTERIDE 0.5 MG PO CAPS
0.5000 mg | ORAL_CAPSULE | Freq: Every day | ORAL | Status: DC
  Administered 2022-01-19 – 2022-01-25 (×7): 0.5 mg via ORAL
  Filled 2022-01-19 (×7): qty 1

## 2022-01-19 MED ORDER — CHILDRENS CHEW MULTIVITAMIN PO CHEW: 1.0000 | CHEWABLE_TABLET | Freq: Two times a day (BID) | ORAL | Status: AC

## 2022-01-19 NOTE — Discharge Summary (Addendum)
Physician Discharge Summary  Patient ID: Allen Arias MRN: [id number] DOB/AGE: 1940-09-08 81 y.o.  Admit date: 12/31/2021 Discharge date: 01/26/2022  Discharge Diagnoses:  Principal Problem:   Acute embolic infarction of spinal cord [facility]) Active Problems:   Hyponatremia   Status post endovascular aneurysm repair (EVAR)   Weakness of both lower extremities   Neurogenic bowel   Neurogenic bladder   Cholelithiasis   Depression   Decreased appetite   Frequent headaches   Insomnia   Discharged Condition: stable  Significant Diagnostic Studies:  US Abdomen Limited RUQ (LIVER/GB)  Result Date: 01/02/2022 CLINICAL DATA:  Abnormal LFTs. EXAM: ULTRASOUND ABDOMEN LIMITED RIGHT UPPER QUADRANT COMPARISON:  CTA abdomen 12/28/2021 FINDINGS: Gallbladder: Multiple hyperechoic shadowing gallstones are seen, measuring up to 10 mm. Hypoechoic nonshadowing gallbladder sludge is also seen. No gallbladder wall thickening, pericholecystic fluid, or sonographic Murphy's sign. Common bile duct: Diameter: 6.7 mm.  No intrahepatic biliary ductal dilatation. Liver: No focal lesion identified. Within normal limits in parenchymal echogenicity. Portal vein is patent on color Doppler imaging with normal direction of blood flow towards the liver. Other: None. IMPRESSION:: IMPRESSION: Cholelithiasis and gallbladder sludge without sonographic evidence of acute cholecystitis. Electronically Signed   By: Yvonne Kendall M.D.   On: 01/02/2022 15:38    Labs:  Basic Metabolic Panel: Recent Labs  Lab 01/20/22 0520 01/24/22 0515 01/26/22 0625  NA 137 137 137  K 3.9 3.5 3.7  CL 107 108 109  CO2 26 22 23   GLUCOSE 86 84 87  BUN 29* 32* 44*  CREATININE 0.78 0.74 0.98  CALCIUM 8.5* 8.8* 8.8*    CBC: Recent Labs  Lab 01/20/22 0520 01/23/22 0723 01/26/22 0625  WBC 6.9 4.5 7.7  HGB 7.6* 9.7* 9.1*  HCT 24.5* 30.3* 28.9*  MCV 103.4* 103.4* 104.7*  PLT 216 253 157    CBG: No results for input(s): GLUCAP in the last  168 hours.  Brief HPI:   Allen Arias is a 81 y.o. 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  He was admitted on 12/26/2021 for TEVAR, right-CFA endarterectomy and LLE thrombectomy by Dr. Donzetta Matters.  Postop course was complicated by inability to move BLE , acute on chronic thrombocytopenia treated with FFP as well as hypotension requiring Levophed.  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 Trenton Gammon questioned follow-up on outpatient basis for decompression once medically stable.  MRI revealed interval edematous swelling and narrowing of central cord at level T12 and L1 compatible spinal cord ischemia/infarct.  He did have significant drop in H&H to 5.7/17.9 with CTA abdomen/BLE which was negative for endoleak and showed small hematoma right-CFA, large hematoma left-PA and ill-defined muscle body hematoma left adductor and hamstring musculature.  He received 3 units PRBCs with follow-up H&H stable.  Foley was placed due to urinary retention issues and reported to have bowel incontinence.  He continued to be limited by BLE paraplegia with posterior lean, anxiety, poor intake affecting overall functional status.  CIR was recommended due to functional decline.   Hospital Course: Allen Arias was admitted to rehab 12/31/2021 for inpatient therapies to consist of PT, and OT at least three hours five days a week. Past admission physiatrist, therapy team and rehab RN have worked together to provide customized collaborative inpatient rehab.  He reported issues with pain control therefore Tylenol 650 mg TID was added  for consistent pain control.  BLE Dopplers done past admission were negative for DVT.   CBC was monitored closely as admission labs revealed persistent thrombocytopenia with platelets at 73.  This has gradually improved without signs of bleeding and as platelets rose  to 206 he was started on Lovenox for DVT prophylaxis on 05/01.  His H&H and platelets have been relatively stable on this dose and will require at least 3 months of Lovenox for DVT prophylaxis due to paraplegia.  His H&H has continued to vary from  7.0 to 8.4 range without signs of bleeding. Stool guaiacs ordered  have been negative X 2. . He was noted to have rise in WBC on 05/04 and reported malaise. He was found >E coli UTI which was treated with 7 day course of Keflex.    His blood pressures were monitored on TID basis and has been stable without orthostatic symptoms. Follow-up check of electrolytes reveal azotemia with BUN rising to 38 therefore he was treated with IV fluids briefly and was encouraged to increase fluid intake.   He was found to have abnormal LFTs with total bilirubin and abdominal ultrasound done showing multiple hypoechoic gallstones without sonographic evidence of acute cholecystitis. Most recent check of CMET  showed that hyponatremia has resolved and total bilirubin is trending down from 2.4-1.9.  No GI symptoms reported.  He has recurrent rise in BUN and recommend encouraging fluid intake after discharge.  He also reported difficulty sleeping at nights as well as HA. Topamax was added with prn Fioricet with resolution of HA and sleep cycle has improved with addition of Ambien.   BLE edema has been managed with use of TEDs  and elevation.  Celexa was added to help with mood stabilization and anxiety.  He has had issues with insomnia and trial of melatonin and trazodone were ineffective therefore Ambien added with good results.  Nutritional supplements have been offered on twice daily to 3 times daily basis due to poor p.o. intake and this is slowly improving.  Foley was removed and bladder program initiated.  He is incontinent of bladder and has required I/O caths intermittently due to neurogenic bladder. Attempts at toileting unsuccessful and foley was placed on am prior to discharge.  Bowel program started with suppository in evening after supper. Attempt to toilet patient after every meal to prevent incontinent.  He has been making gains with therapy but continues to require care. Family is unable to provide care needed and has elected on SNF for further therapy. He was discharged to [facility] on 01/26/22.    Rehab course: During patient's stay in rehab weekly team conferences were held to monitor patient's progress, set goals and discuss barriers to discharge. At admission, patient required max assist with ADL tasks and with mobility. He  has had improvement in activity tolerance, balance, postural control as well as ability to compensate for deficits. He requires min to max assist for bathing and max to total assist with dressing. He requires supervision with cues for bed mobility and supervision for lateral scoot transfers with use of SB. He requires total assist to place SB. He is able to propel his wheelchair for 150'.   Disposition: Start.   Diet: Regular.   Special Instructions: Continue Lovenox until 03/26/22 to prevent DVT.  Continue bowel program in evenings.  Perform foley care twice a day and after BM.   Discharge Instructions     Ambulatory referral to Physical Medicine Rehab   Complete  by: As directed    4 week follow up appt      Allergies as of 01/26/2022   No Active Allergies      Medication List     STOP taking these medications    amLODipine 5 MG tablet Commonly known as: NORVASC   Aspirin Buf(CaCarb-MgCarb-MgO) 81 MG Tabs   enalapril 10 MG tablet Commonly known as: VASOTEC   Melatonin 10 MG Caps Replaced by: melatonin 3 MG Tabs tablet   multivitamin with minerals Tabs tablet   tamsulosin 0.4 MG Caps capsule Commonly known as: FLOMAX   traZODone 50 MG tablet Commonly known as: DESYREL       TAKE these medications    acetaminophen 325 MG tablet Commonly known as: TYLENOL Take 2 tablets (650 mg total)  by mouth 3 (three) times daily.   bisacodyl 10 MG suppository Commonly known as: DULCOLAX Place 1 suppository (10 mg total) rectally every other day. After supper   butalbital-acetaminophen-caffeine 50-325-40 MG tablet--Rx # 5 pills Commonly known as: FIORICET Take 1 tablet by mouth daily as needed for headache (for headache).   childrens multivitamin chewable tablet Chew 1 tablet by mouth 2 (two) times daily.   citalopram 20 MG tablet Commonly known as: CELEXA Take 1 tablet (20 mg total) by mouth daily.   dutasteride 0.5 MG capsule Commonly known as: AVODART Take 1 capsule (0.5 mg total) by mouth daily after supper. What changed: when to take this   enoxaparin 40 MG/0.4ML injection Commonly known as: LOVENOX Inject 0.4 mLs (40 mg total) into the skin daily.   feeding supplement Liqd Take 237 mLs by mouth with breakfast, with lunch, and with evening meal.   melatonin 3 MG Tabs tablet Take 1 tablet (3 mg total) by mouth at bedtime. Replaces: Melatonin 10 MG Caps   polycarbophil 625 MG tablet Commonly known as: FIBERCON Take 1 tablet (625 mg total) by mouth 2 (two) times daily.   polyethylene glycol 17 g packet Commonly known as: MIRALAX / GLYCOLAX Take 17 g by mouth daily as needed for mild constipation.   simvastatin 20 MG tablet Commonly known as: ZOCOR Take 20 mg by mouth at bedtime.   topiramate 25 MG tablet Commonly known as: TOPAMAX Take 1 tablet (25 mg total) by mouth at bedtime.   VITAMIN C PO Take 1 capsule by mouth daily.   Vitamin D3 25 MCG tablet Commonly known as: Vitamin D Take 1 tablet (1,000 Units total) by mouth daily. What changed:  medication strength how much to take   zolpidem 5 MG tablet--Rx # 5 pills Commonly known as: AMBIEN Take 1 tablet (5 mg total) by mouth at bedtime.         Contact information for follow-up providers     Waynetta Sandy, MD Follow up.   Specialties: Vascular Surgery, Cardiology Contact  information: [place] [address] [phone]         Courtney Heys, MD Follow up.   Specialty: Physical Medicine and Rehabilitation Why: office will call you with follow up appointment Contact information: [id number] [place] [address] [phone]         Bernerd Limbo, MD Follow up.   Specialty: Family Medicine Why: call for follow up appointment Contact information: [place] [address] [phone]         Bjorn Loser, MD Follow up on 01/27/2022.   Specialty: Urology Why: Appointment at 1:45 pm Contact information: [id number]  [place] [address] [phone]              Contact information for after-discharge care     Destination     [facility] .   Service: Skilled Nursing Contact information: [place] [phone]                     Signed: Bary Leriche 01/26/2022, 9:34 AM

## 2022-01-19 NOTE — Progress Notes (Signed)
Occupational Therapy Session Note ? ?Patient Details  ?Name: Allen Arias ?MRN: [id number] ?Date of Birth: 05/30/1941 ? ?Today's Date: 01/19/2022 ?OT Individual Time: 1017-5102 ?OT Individual Time Calculation (min): 75 min  ? ? ?Short Term Goals: ?Week 3:  OT Short Term Goal 1 (Week 3): Pt will complete LB dressing at bed level or sitting EOB with max A ?OT Short Term Goal 2 (Week 3): Pt will perform DABSC transfers with min A (SB or scoot) ?OT Short Term Goal 3 (Week 3): Pt will perform clothing mgmt tasks seated on DABSC with max A ? ?Skilled Therapeutic Interventions/Progress Updates:  ?  [employer] interpreter [phone] and [phone]. OT intervention with focus on bed mobility, LB bahting/dressing at bed level, sitting balance, SB transfers, UB bahting/dressing at w/c level, w/c mobility, and BUE therex. LB bathing/dressing with max A. Bed mobility with min A for rolling R/L and supine>sit EOB. SB tranfser to w/c with CGA. UB bating/dressing with supervision. W/c mobility with supervision and increased time. Pt engaged in tossing 1kg ball against bounce back 3x10. SB transfer back to bed at end of session. Pt remained in bed with all needs within reach and bed alarm activated.  ? ?Therapy Documentation ?Precautions:  ?Precautions ?Precautions: Fall ?Precaution Comments: foley ?Restrictions ?Weight Bearing Restrictions: No ?  ?Pain: ? Pt reports he does not have pain this morning ? ?Therapy/Group: Individual Therapy ? ?[name] ?01/19/2022, 9:32 AM ?

## 2022-01-19 NOTE — Progress Notes (Signed)
Performed Dig Stim this am with small bm out of rectum. Pt tolerated well ?

## 2022-01-19 NOTE — Progress Notes (Signed)
Physical Therapy Session Note ? ?Patient Details  ?Name: Allen Arias ?MRN: [id number] ?Date of Birth: 11/08/1940 ? ?Today's Date: 01/19/2022 ?PT Individual Time: 7616-0737 ?PT Individual Time Calculation (min): 27 min  ? ?Short Term Goals: ?Week 3:  PT Short Term Goal 1 (Week 3): Will be able to perform bed/chair transfer CGA with good demonstration of head/hips relationship ?PT Short Term Goal 2 (Week 3): Will be able to maintain dynamic sitting balance with CGA consistently ?PT Short Term Goal 3 (Week 3): Will be able to manage w/c parts to set chair up for transfer with Supervision and cueing ? ?Skilled Therapeutic Interventions/Progress Updates:  ?  Pt presents in w/c and agreeable to session with pt request to focus on strengthening his legs. Utilized Nustep for [employer] for reciprocal movement pattern retraining and muscle activation in BLE. Pt performed min assist slideboard transfer to Nustep (uphill) with focus on anterior weightshift and head/hips relationship. Assisted with placement of BLE on Nustep and initially performed level 4 using BUE to assist, then reduced to 0 to attempt to just activate through BLE with PT assisting movement and then finished again at end for last 2 min with UE support again total of 8 min. Performed squat pivot transfer to R from Nustep > w/c with cues for hand placement and technique with overall min assist. Pt self propelled x 30' for general UE strengthening to initiate return to room.  ? ?Video interpreter used for therapy session. ? ?Therapy Documentation ?Precautions:  ?Precautions ?Precautions: Fall ?Restrictions ?Weight Bearing Restrictions: No ? ?  ?Pain: ?Pain Assessment ?Pain Scale: 0-10 ?Pain Score: 0-No pain ? ? ? ? ?Therapy/Group: Individual Therapy ? ?Tedd Sias ?Philip Aspen, PT, DPT, CBIS ? ?01/19/2022, 3:03 PM  ?

## 2022-01-19 NOTE — Progress Notes (Signed)
Physical Therapy Session Note ? ?Patient Details  ?Name: Allen Arias ?MRN: [id number] ?Date of Birth: [birth year] ? ?Today's Date: 01/19/2022 ?PT Individual Time: 1000-1100 ?PT Individual Time Calculation (min): 60 min  ? ?Short Term Goals: ?Week 3:  PT Short Term Goal 1 (Week 3): Will be able to perform bed/chair transfer CGA with good demonstration of head/hips relationship ?PT Short Term Goal 2 (Week 3): Will be able to maintain dynamic sitting balance with CGA consistently ?PT Short Term Goal 3 (Week 3): Will be able to manage w/c parts to set chair up for transfer with Supervision and cueing ? ?Skilled Therapeutic Interventions/Progress Updates:  ?  Pt received supine in bed, agreeable to PT session. No complaints of pain. Supine to sit with Supervision with increased time needed for management of BLE with use of leg loops, use of bedrail and HOB elevated for trunk elevation. Slide board transfer bed to w/c with CGA to min A needed during session. Pt still needs some assist to place board and needs cues for head/hips relationship during transfers. Pt is at Supervision level for w/c mobility to/from therapy gym. Session focus on supine BLE AAROM strengthening. Pt is able to perform SAQ, quad sets, and hip abduction with palpable muscle contractions L>R. Pt exhibits improved RLE strength as compared to previous week. Pt found to be incontinent of urine in brief. Transferred back to bed. Rolling L/R with min A for dependent brief change and pericare. Pt also reporting bladder pain at this time, described as a "contracting muscle pain". Nursing notified of pt's symptoms. Pt requesting to transfer back to w/c at end of session, similar transfer method as stated above. Pt left seated in w/c in room with needs in reach at end of session. ? ?Utilized [employer] video interpreter Fannie Knee [phone] during session. ? ?Therapy Documentation ?Precautions:  ?Precautions ?Precautions: Fall ?Precaution Comments: foley ?Restrictions ?Weight  Bearing Restrictions: No ? ? ? ? ? ?Therapy/Group: Individual Therapy ? ? ?Peter Congo, PT, DPT, CSRS ?01/19/2022, 2:10 PM  ?

## 2022-01-19 NOTE — Progress Notes (Signed)
Occupational Therapy Session Note ? ?Patient Details  ?Name: Allen Arias ?MRN: [id number] ?Date of Birth: [birth year] ? ?Today's Date: 01/19/2022 ?OT Individual Time: 1430-1500 ?OT Individual Time Calculation (min): 30 min  ? ? ?Short Term Goals: ?Week 1:  OT Short Term Goal 1 (Week 1): Pt will sit EOB dynamically with no more than MIN A for 5 min ?OT Short Term Goal 1 - Progress (Week 1): Met ?OT Short Term Goal 2 (Week 1): Pt will compeltes SB transfer to [facility] wiht MOD A ?OT Short Term Goal 2 - Progress (Week 1): Progressing toward goal ?OT Short Term Goal 3 (Week 1): Pt will maintain circle sitting wiht MIN A in prep for LB dressing for 5 min ?OT Short Term Goal 3 - Progress (Week 1): Progressing toward goal ?Week 2:  OT Short Term Goal 1 (Week 2): Pt will compeltes SB transfer to [facility] wiht MOD A ?OT Short Term Goal 1 - Progress (Week 2): Met ?OT Short Term Goal 2 (Week 2): Pt will maintain circle sitting wiht MIN A in prep for LB dressing for 5 min ?OT Short Term Goal 2 - Progress (Week 2): Met ?OT Short Term Goal 3 (Week 2): Pt will complete UB dressing with min A seated EOB ?OT Short Term Goal 3 - Progress (Week 2): Met ?OT Short Term Goal 4 (Week 2): Pt will perform supine>sit EOB with min A in preparation for transfers ?OT Short Term Goal 4 - Progress (Week 2): Met ? ?Skilled Therapeutic Interventions/Progress Updates:  ?  1:1 Pt received in the bed. Focus on self care retraining in supported cirlce sitting in the bed with focus on donning shoes and leg loops. Pt with tightness in left hip and unable to bring his foot up to him enough to be able to thread his shoe on his foot or tie. However he was successful with instructional cues with donning shoe and tying on the right. Pt able to don 2/3 of the leg loop buckles. PT with difficulty with the ankle strap. PT able to bring his LEs off the edge of the bed using the leg loops with contact guard. PT able to sit EOB in prep for transfer with supervision. PT able to  laterally lean to the right for SB to be place under thigh. Contact guard transfer with the slide board into the w/c. Nursing came in and needed to assist his buttocks. Transferred back into the bed the same way with contact guard with SB. Pt able to roll with min A in supine position with bed rails. A with both LEs for time to get into supine. Returned to w/c in same manner. Worked on donning Leg rests with max instructional cuing.  ? ?Did use video interpreter - straus  ? ?Therapy Documentation ?Precautions:  ?Precautions ?Precautions: Fall ?Precaution Comments: foley ?Restrictions ?Weight Bearing Restrictions: No ?General: ?  ?Vital Signs: ?Therapy Vitals ?Temp: 97.6 ?F (36.4 ?C) ?Pulse Rate: 87 ?Resp: 18 ?BP: (!) 146/78 ?Patient Position (if appropriate): Sitting ?Oxygen Therapy ?SpO2: 98 % ?O2 Device: Room Air ?Pain: ?Pain Assessment ?Pain Scale: 0-10 ?Pain Score: 0-No pain ? ? ? ?Therapy/Group: Individual Therapy ? ?Nicoletta Ba ?01/19/2022, 3:27 PM ?

## 2022-01-19 NOTE — Progress Notes (Signed)
?                                                       PROGRESS NOTE ? ? ?Subjective/Complaints: ?Does not feel he needs intermittent catheterization. Feels he can urinate better on his own when standing. As per Elijah Birkom he is requiring significant assist to stand at this time ? ?ROS: Limited by language.  Use of interpreter services patient was able to state no CP, SOB. Does report headache today, says today that he has had headaches several nights while here. +urinary retention ? ? ?Objective: ?  ?No results found. ? ?Recent Labs  ?  01/19/22 ?[id number]  ?WBC 5.6  ?HGB 7.7*  ?HCT 24.4*  ?PLT 213  ? ? ?Recent Labs  ?  01/19/22 ?[id number]  ?NA 137  ?K 3.9  ?CL 106  ?CO2 24  ?GLUCOSE 88  ?BUN 27*  ?CREATININE 0.78  ?CALCIUM 8.5*  ? ? ? ?Intake/Output Summary (Last 24 hours) at 01/19/2022 1248 ?Last data filed at 01/19/2022 0700 ?Gross per 24 hour  ?Intake 338 ml  ?Output --  ?Net 338 ml  ?  ? ?  ? ?Physical Exam: ? ?Gen: no distress, normal appearing ?HEENT: oral mucosa pink and moist, NCAT ?Cardio: Reg rate ?Chest: normal effort, normal rate of breathing ?Abd: soft, non-distended ?Ext: no edema ?Psych: pleasant, normal affect ? ?GU: Incontinence of urine, wearing brief. ?Neurological: Alert son in room interpreting. ?Musc: No edema. 1/5 in RLE, 2-/5 in LLE ?Extremities: pitting LLE edema 1-2+ to L knee-trace on RLE ?Skin: Bilateral groin incision, flat, non-tender, non-swollen, skin glue, RLE incision: flat, non-tender, non-swollen, skin glue ?Psychiatric appropriate-flattened affect ? ?Vital Signs ?Blood pressure 135/67, pulse 70, temperature 97.7 ?F (36.5 ?C), resp. rate 16, height 5\' 4"  (1.626 m), weight 52.4 kg, SpO2 98 %. ? ? ? ?Assessment/Plan: ?1. Functional deficits which require 3+ hours per day of interdisciplinary therapy in a comprehensive inpatient rehab setting. ?Physiatrist is providing close team supervision and 24 hour management of active medical problems listed below. ?Physiatrist and rehab team continue to  assess barriers to discharge/monitor patient progress toward functional and medical goals ? ?Care Tool: ? ?Bathing ?   ?Body parts bathed by patient: Right arm, Left arm, Chest, Abdomen, Front perineal area, Right upper leg, Left upper leg, Face  ? Body parts bathed by helper: Buttocks, Right lower leg, Left lower leg ?Body parts n/a: Front perineal area ?  ?Bathing assist Assist Level: Moderate Assistance - Patient 50 - 74% ?  ?  ?Upper Body Dressing/Undressing ?Upper body dressing   ?What is the patient wearing?: Pull over shirt ?   ?Upper body assist Assist Level: Supervision/Verbal cueing ?   ?Lower Body Dressing/Undressing ?Lower body dressing ? ? ?   ?What is the patient wearing?: Pants, Incontinence brief ? ?  ? ?Lower body assist Assist for lower body dressing: Maximal Assistance - Patient 25 - 49% ?   ? ?Toileting ?Toileting    ?Toileting assist Assist for toileting: Dependent - Patient 0% ?  ?  ?Transfers ?Chair/bed transfer ? ?Transfers assist ?   ? ?Chair/bed transfer assist level: Minimal Assistance - Patient > 75% ?  ?  ?Locomotion ?Ambulation ? ? ?Ambulation assist ? ? Ambulation activity did not occur: Safety/medical concerns ? ?  ?  ?   ? ?Walk  10 feet activity ? ? ?Assist ? Walk 10 feet activity did not occur: Safety/medical concerns ? ?  ?   ? ?Walk 50 feet activity ? ? ?Assist Walk 50 feet with 2 turns activity did not occur: Safety/medical concerns ? ?  ?   ? ? ?Walk 150 feet activity ? ? ?Assist Walk 150 feet activity did not occur: Safety/medical concerns ? ?  ?  ?  ? ?Walk 10 feet on uneven surface  ?activity ? ? ?Assist Walk 10 feet on uneven surfaces activity did not occur: Safety/medical concerns ? ? ?  ?   ? ?Wheelchair ? ? ? ? ?Assist Is the patient using a wheelchair?: Yes ?Type of Wheelchair: Manual ?  ? ?Wheelchair assist level: Supervision/Verbal cueing ?Max wheelchair distance: 156ft  ? ? ?Wheelchair 50 feet with 2 turns activity ? ? ? ?Assist ? ?  ?  ? ? ?Assist Level:  Supervision/Verbal cueing  ? ?Wheelchair 150 feet activity  ? ? ? ?Assist ?   ? ? ?Assist Level: Dependent - Patient 0%  ? ?Blood pressure 135/67, pulse 70, temperature 97.7 ?F (36.5 ?C), resp. rate 16, height 5\' 4"  (1.626 m), weight 52.4 kg, SpO2 98 %. ? ?Medical Problem List and Plan: ?1. 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 ?            - Patient may shower but incisions must be covered. ?             -ELOS/Goals: Min 10 to 14 days ?5/10- Talked to daughter- will keep until find a SNF, but need to find soon- will likely need to d/c early next week.  ? Continue CIR ?2.  Antithrombotics: ?-DVT/anticoagulation: Mechanical: Sequential compression devices, below knee bilateral lower extremities ?- Check Dopplers in a.m. ?-01/01/2022 cannot be on Lovenox or Eliquis due to having low platelets with a risk of bleeding.  However if his platelets get above 100 K may consider starting.   ?-4/27 Vas 5/27: Negative for DVT bilateral ?-4/29 Continue SCD's, while trending Plts ?-4/29 Plts today 132, up from 73 on 4/27, continue to trend to    ensure stays above 100K ?-4/30 Plts at 154 today, continue to trend with am labs ?5/1- plts 206- will start Lovenox since high risk for DVT and recheck labs Thursday.  ?5/5 plts 220- continue to monitor ?5/6 no new labs but will trend platelets with the q. Monday labs. ?5/9- plts 187k- con't lovenox ?5/11- will need lovenox for a total of 3 months from SCI- so ~ 03/26/22 ? ?  Latest Ref Rng & Units 01/19/2022  ?  5:14 AM 01/12/2022  ?  5:31 AM 01/09/2022  ?  6:20 AM  ?CBC  ?WBC 4.0 - 10.5 K/uL 5.6   2.4   14.8    ?Hemoglobin 13.0 - 17.0 g/dL 7.7   9.3   8.5    ?Hematocrit 39.0 - 52.0 % 24.4   28.6   25.8    ?Platelets 150 - 400 K/uL 213   187   220    ?  ? -antiplatelet therapy:N/A ?3. Pain Management/Post-Op: Start Tylenol 650 mg 3 times daily.  Oxycodone as needed. ?4. Mood: Team to provide a good support.  May need antidepressant-family and patient some disappointed and  had multiple questions anxiety with regards to recovery and interventions potentially seeking second opinion etc.  Discussed with Dr. 03/11/2022 specially on spinal cord pathology. ?-01/01/2022 we will start Celexa 20 mg daily for mood. ?-4/29  Mood appropriate today. ?5/10- more sad today, but said he's "tolerable".  ?5/14 Okay spirits today. ?- Antipsychotic agents: Not applicable ?5. Neuropsych: This patient may be capable of making decisions on his own behalf. ?6. Skin/Wound Care: Routine pressure relief measures.  ?            --Gerhadt's cream to scrotal breakdown ?           -4/30-Continue use of cream as protective barrier. ?5/10- is healing per staff- con't regimen  ?5/14 improved per nursing, continue barrier cream. ?7. Fluids/Electrolytes/Nutrition: Routine in and outs with follow-up chemistries ?-4/29 continue to check Cmet ?8.  History of BPH/urinary retention.  Continue to hold Hytrin and Flomax for now. ?- Monitor BP for any orthostatic changes/drops. ?-4/30 Pending orthostatic vitals.  Pt voiding adequately s/p Foley removal. ?9.  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     -Has varied from 100 down to 44 down to 56 ?-4/29 Plts today 132, up from 73 on 4/27, continue to trend to    ensure stays above 100K ?-4/30 Plts at 154 today, continue to trend with am labs ?5/1- Plts 206- start Lovenox ?5/6 patient to continue Lovenox no bleeding issues. ?   5/9- Plts 187k- doing well- cont' lovneox ?            5/11- use lovenox until 03/26/22 to prevent DVT then can stop ?5/13 Continue plan above. ?10.  ABLA: Monitor H/H for stability. ?- Improved from 5.7-10.  Recheck CBC in AM. ?-01/01/22 hemoglobin 9.1. ?-01/03/22 today hemoglobin has dropped to 7.0 there are no obvious signs of bleeding.  Patient does not report seeing visible blood in stool or urine.  New orders for stool guaiac x3 UA to rule out blood in urine repeat H&H and orthostatic blood pressure to monitor for symptoms. ?-4/29 U/A  negative for blood in urine. ?-4/30 Hgb up to 7.4 today from 7.0 yesterday, CBC in am to trend. Pending stool guaiac.  ?5/1- Hb 8.1 so don't know why it dropped so much- con't to monitor ?5/4- HGB 8.4 today, appears to be

## 2022-01-19 NOTE — Progress Notes (Signed)
Patient ID: Allen Arias, male   DOB: February 06, 1941, 81 y.o.   MRN: [id number] ? ?SW spoke with Lori/[facility] to discuss for bed offer. Reports they did receive the application, however, they do not have beds available now as a current resident required. SW will follow-up tomorrow to see if there are beds available.  ? ?SW left message for dtr [name] to discuss above and preferred SNF beds. SW waiting on follow-up.  ? ?Cecile Sheerer, MSW, LCSWA ?Office: [phone] ?Cell: [phone] ?Fax: [phone]  ?

## 2022-01-20 DIAGNOSIS — R7989 Other specified abnormal findings of blood chemistry: Secondary | ICD-10-CM

## 2022-01-20 DIAGNOSIS — R238 Other skin changes: Secondary | ICD-10-CM

## 2022-01-20 DIAGNOSIS — N319 Neuromuscular dysfunction of bladder, unspecified: Secondary | ICD-10-CM

## 2022-01-20 LAB — BASIC METABOLIC PANEL
Anion gap: 4 — ABNORMAL LOW (ref 5–15)
BUN: 29 mg/dL — ABNORMAL HIGH (ref 8–23)
CO2: 26 mmol/L (ref 22–32)
Calcium: 8.5 mg/dL — ABNORMAL LOW (ref 8.9–10.3)
Chloride: 107 mmol/L (ref 98–111)
Creatinine, Ser: 0.78 mg/dL (ref 0.61–1.24)
GFR, Estimated: >60 mL/min (ref >60)
Glucose, Bld: 86 mg/dL (ref 70–99)
Potassium: 3.9 mmol/L (ref 3.5–5.1)
Sodium: 137 mmol/L (ref 135–145)

## 2022-01-20 LAB — CBC
HCT: 24.5 % — ABNORMAL LOW (ref 39.0–52.0)
Hemoglobin: 7.6 g/dL — ABNORMAL LOW (ref 13.0–17.0)
MCH: 32.1 pg (ref 26.0–34.0)
MCHC: 31 g/dL (ref 30.0–36.0)
MCV: 103.4 fL — ABNORMAL HIGH (ref 80.0–100.0)
Platelets: 216 10*3/uL (ref 150–400)
RBC: 2.37 MIL/uL — ABNORMAL LOW (ref 4.22–5.81)
RDW: 18.6 % — ABNORMAL HIGH (ref 11.5–15.5)
WBC: 6.9 10*3/uL (ref 4.0–10.5)
nRBC: 0 % (ref 0.0–0.2)

## 2022-01-20 NOTE — Progress Notes (Signed)
?                                                       PROGRESS NOTE ? ? ?Subjective/Complaints: ?Continues to have incontinence, he declines foley but says he will consider.  ? ?ROS: Limited by language.  Use of interpreter services patient was able to state no CP, SOB. ?Positive for HA and Urinary Incontinence ? ? ?Objective: ?  ?No results found. ? ?Recent Labs  ?  01/19/22 ?[id number] 01/20/22 ?[id number]  ?WBC 5.6 6.9  ?HGB 7.7* 7.6*  ?HCT 24.4* 24.5*  ?PLT 213 216  ? ? ? ?Recent Labs  ?  01/19/22 ?[id number] 01/20/22 ?[id number]  ?NA 137 137  ?K 3.9 3.9  ?CL 106 107  ?CO2 24 26  ?GLUCOSE 88 86  ?BUN 27* 29*  ?CREATININE 0.78 0.78  ?CALCIUM 8.5* 8.5*  ? ? ? ? ?Intake/Output Summary (Last 24 hours) at 01/20/2022 1107 ?Last data filed at 01/20/2022 0840 ?Gross per 24 hour  ?Intake 340 ml  ?Output --  ?Net 340 ml  ? ?  ? ?  ? ?Physical Exam: ? ?Gen: no distress, normal appearing ?HEENT: oral mucosa pink and moist, NCAT ?Cardio: Reg rate ?Chest: normal effort, CTAB ?Abd: soft, non-distended ?Ext: no edema ?Psych: pleasant, normal affect ? ?GU: Incontinence of urine, wearing brief. ?Neurological: Alert son in room interpreting. ?Musc: No edema. 1/5 in RLE, 2-/5 in LLE ?Extremities: pitting LLE edema 1 + to L knee-trace on RLE ?Skin: Bilateral groin incision, flat, non-tender, non-swollen, skin glue,  ?RLE incision: flat, non-tender, non-swollen, skin glue ?Psychiatric appropriate-flattened affect ? ?Vital Signs ?Blood pressure 132/69, pulse 72, temperature 97.8 ?F (36.6 ?C), resp. rate 18, height 5\' 4"  (1.626 m), weight 52.4 kg, SpO2 99 %. ? ? ? ?Assessment/Plan: ?1. Functional deficits which require 3+ hours per day of interdisciplinary therapy in a comprehensive inpatient rehab setting. ?Physiatrist is providing close team supervision and 24 hour management of active medical problems listed below. ?Physiatrist and rehab team continue to assess barriers to discharge/monitor patient progress toward functional and medical goals ? ?Care  Tool: ? ?Bathing ?   ?Body parts bathed by patient: Right arm, Left arm, Chest, Abdomen, Front perineal area, Right upper leg, Left upper leg, Face  ? Body parts bathed by helper: Buttocks, Right lower leg, Left lower leg ?Body parts n/a: Front perineal area ?  ?Bathing assist Assist Level: Moderate Assistance - Patient 50 - 74% ?  ?  ?Upper Body Dressing/Undressing ?Upper body dressing   ?What is the patient wearing?: Pull over shirt ?   ?Upper body assist Assist Level: Supervision/Verbal cueing ?   ?Lower Body Dressing/Undressing ?Lower body dressing ? ? ?   ?What is the patient wearing?: Pants, Incontinence brief ? ?  ? ?Lower body assist Assist for lower body dressing: Maximal Assistance - Patient 25 - 49% ?   ? ?Toileting ?Toileting    ?Toileting assist Assist for toileting: Dependent - Patient 0% ?  ?  ?Transfers ?Chair/bed transfer ? ?Transfers assist ?   ? ?Chair/bed transfer assist level: Minimal Assistance - Patient > 75% ?  ?  ?Locomotion ?Ambulation ? ? ?Ambulation assist ? ? Ambulation activity did not occur: Safety/medical concerns ? ?  ?  ?   ? ?Walk 10 feet activity ? ? ?Assist ? Walk 10 feet  activity did not occur: Safety/medical concerns ? ?  ?   ? ?Walk 50 feet activity ? ? ?Assist Walk 50 feet with 2 turns activity did not occur: Safety/medical concerns ? ?  ?   ? ? ?Walk 150 feet activity ? ? ?Assist Walk 150 feet activity did not occur: Safety/medical concerns ? ?  ?  ?  ? ?Walk 10 feet on uneven surface  ?activity ? ? ?Assist Walk 10 feet on uneven surfaces activity did not occur: Safety/medical concerns ? ? ?  ?   ? ?Wheelchair ? ? ? ? ?Assist Is the patient using a wheelchair?: Yes ?Type of Wheelchair: Manual ?  ? ?Wheelchair assist level: Supervision/Verbal cueing ?Max wheelchair distance: 150'  ? ? ?Wheelchair 50 feet with 2 turns activity ? ? ? ?Assist ? ?  ?  ? ? ?Assist Level: Supervision/Verbal cueing  ? ?Wheelchair 150 feet activity  ? ? ? ?Assist ?   ? ? ?Assist Level:  Supervision/Verbal cueing  ? ?Blood pressure 132/69, pulse 72, temperature 97.8 ?F (36.6 ?C), resp. rate 18, height 5\' 4"  (1.626 m), weight 52.4 kg, SpO2 99 %. ? ?Medical Problem List and Plan: ?1. 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 ?            - Patient may shower but incisions must be covered. ?             -ELOS/Goals: Min 10 to 14 days ?5/16 Team conference today, Social work working on [employer], Family will likely need to decide soon, they want him at a location close to home if possible ?Continue CIR ?2.  Antithrombotics: ?-DVT/anticoagulation: Mechanical: Sequential compression devices, below knee bilateral lower extremities ?- Check Dopplers in a.m. ?-01/01/2022 cannot be on Lovenox or Eliquis due to having low platelets with a risk of bleeding.  However if his platelets get above 100 K may consider starting.   ?-4/27 Vas [place]: Negative for DVT bilateral ?-4/29 Continue SCD's, while trending Plts ?-4/29 Plts today 132, up from 73 on 4/27, continue to trend to    ensure stays above 100K ?-4/30 Plts at 154 today, continue to trend with am labs ?5/1- plts 206- will start Lovenox since high risk for DVT and recheck labs Thursday.  ?5/5 plts 220- continue to monitor ?5/6 no new labs but will trend platelets with the q. Monday labs. ?5/9- plts 187k- con't lovenox ?5/11- will need lovenox for a total of 3 months from SCI- so ~ 03/26/22 ? ? ?  Latest Ref Rng & Units 01/20/2022  ?  5:20 AM 01/19/2022  ?  5:14 AM 01/12/2022  ?  5:31 AM  ?CBC  ?WBC 4.0 - 10.5 K/uL 6.9   5.6   2.4    ?Hemoglobin 13.0 - 17.0 g/dL 7.6   7.7   9.3    ?Hematocrit 39.0 - 52.0 % 24.5   24.4   28.6    ?Platelets 150 - 400 K/uL 216   213   187    ?  ? -antiplatelet therapy:N/A ?3. Pain Management/Post-Op: Start Tylenol 650 mg 3 times daily.  Oxycodone as needed. ?4. Mood: Team to provide a good support.  May need antidepressant-family and patient some disappointed and had multiple questions anxiety with regards to recovery and  interventions potentially seeking second opinion etc.  Discussed with Dr. Johnsie Cancel specially on spinal cord pathology. ?-01/01/2022 we will start Celexa 20 mg daily for mood. ?-4/29 Mood appropriate today. ?5/10- more sad today, but said  he's "tolerable".  ?5/14 Okay spirits today. ?- Antipsychotic agents: Not applicable ?5. Neuropsych: This patient may be capable of making decisions on his own behalf. ?6. Skin/Wound Care: Routine pressure relief measures.  ?            --Gerhadt's cream to scrotal breakdown ?           -4/30-Continue use of cream as protective barrier. ?5/10- is healing per staff- con't regimen  ?5/14 improved per nursing, continue barrier cream. ?5/16 Discussed foley may help keep skin dry and prevent  breakdown ?7. Fluids/Electrolytes/Nutrition: Routine in and outs with follow-up chemistries ?-4/29 continue to check Cmet ?8.  History of BPH/urinary retention.  Continue to hold Hytrin and Flomax for now. ?- Monitor BP for any orthostatic changes/drops. ?-4/30 Pending orthostatic vitals.  Pt voiding adequately s/p Foley removal. ?9.  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     -Has varied from 100 down to 44 down to 56 ?-4/29 Plts today 132, up from 73 on 4/27, continue to trend to    ensure stays above 100K ?-4/30 Plts at 154 today, continue to trend with am labs ?5/1- Plts 206- start Lovenox ?5/6 patient to continue Lovenox no bleeding issues. ?5/9- Plts 187k- doing well- cont' lovneox ?5/11- use lovenox until 03/26/22 to prevent DVT then can stop ?5/13 Continue plan above. ?5/16 plts 216, continue lovenox ?10.  ABLA: Monitor H/H for stability. ?- Improved from 5.7-10.  Recheck CBC in AM. ?-01/01/22 hemoglobin 9.1. ?-01/03/22 today hemoglobin has dropped to 7.0 there are no obvious signs of bleeding.  Patient does not report seeing visible blood in stool or urine.  New orders for stool guaiac x3 UA to rule out blood in urine repeat H&H and orthostatic blood pressure to monitor for  symptoms. ?-4/29 U/A negative for blood in urine. ?-4/30 Hgb up to 7.4 today from 7.0 yesterday, CBC in am to trend. Pending stool guaiac.  ?5/1- Hb 8.1 so don't know why it dropped so much- con't to monitor ?5/4- HGB 8.

## 2022-01-20 NOTE — Patient Care Conference (Signed)
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 Fanny Dance Social Worker Present: Cecile Sheerer, LCSWA Nurse Present: Kennyth Arnold, RN PT Present: Peter Congo, PT OT Present: Ardis Rowan, COTA;Jennifer Katrinka Blazing, OT PPS Coordinator present : Fae Pippin, SLP     Current Status/Progress Goal Weekly Team Focus  Bowel/Bladder   incontinent b/b  regain continence  continue bladder and bowel program as ordered   Swallow/Nutrition/ Hydration             ADL's   UB bathing/dressing w/c level with supervision; LB bathing/dressing at bed level-max A; SB tranfsers-CGA; toileting at bed level with tot A  min A overall; mod a LB dressing and toileting  education, BADL retaining, safety awareness, activity tolerance   Mobility   CGA bed mobility with leg loops/mod A without, CGA to min A SB transfer, Supervision w/c mobility (still needs assist managing w/c parts), dependent to stand wtih equipment  minA overall, mod I w/c mobility  transfers, sitting balance, w/c mobility and management, SCI edu, LE ROM, estim   Communication              Safety/Cognition/ Behavioral Observations            Pain   reports headache  < 3  assess pain q 4hr and prn   Skin   MASD, bilater groin incision, left leg incision  no new breakdown or infection  assess skin q shift and prn     Discharge Planning:  Pt is likely going to be SNF placement because dtr only able to provide intermittent support, and pt wife has dementia in which he was primary caregiver. D/c plan is still unclear at this time. Family has been informed on SNF placement process, and encouraged to apply for LTC MCD.   Team Discussion: Patient and family to make a decision concerning foley placement. Experiencing urinary leakage. Bowel program going well. Remains incontinent. Reports 8/10 headache. MASD using barrier cream, incisions with no s/s infection/breakdown. Will begin Lovenox education. Family still making a decision concerning discharge location. Will require a lot of care for an unknown period of time.   Patient on target to meet rehab goals: yes, min assist at [facility] level. Currently patient is about same as last week. Some return in right leg, not functional. Will start Estem this week. Able to get legs off bed using leg loops. Working on [employer] transfers.  *See Care Plan and progress notes for long and short-term goals.   Revisions to Treatment Plan:  Continue to monitor.   Teaching Needs: Family education, Production manager,  bowel/bladder program, skin/wound care, transfer training, etc.   Current Barriers to Discharge: Inaccessible home environment, Decreased caregiver support, Home enviroment access/layout, Neurogenic bowel and bladder, Wound care, and Lack of/limited family support  Possible Resolutions to Barriers: Family education Decision on SNF Foley placement     Medical Summary Current Status: thoracic spinal cord infarct.  Neurogenic bowel and bladder. anemia, Headaches  Barriers to Discharge: Home enviroment  access/layout;Incontinence;Decreased family/caregiver support;Insurance for SNF coverage;Neurogenic Bowel & Bladder  Barriers to Discharge Comments: thoracic spinal cord infarct.  Neurogenic bowel and bladder. anemia, Headaches Possible Resolutions to [employer] Focus: foley cath, bowel care, medications for pain control, follow labs   Continued Need for Acute Rehabilitation Level of Care: The patient requires daily medical management by a physician with specialized training in physical medicine and rehabilitation for the following reasons: Direction of a multidisciplinary physical rehabilitation program to maximize functional independence : Yes Medical management of patient stability for increased activity during participation in an intensive rehabilitation regime.: Yes Analysis of laboratory values and/or radiology reports with any subsequent need for medication adjustment and/or medical intervention. : Yes   I attest that I was present, lead the team conference, and concur with the assessment and plan of the team.   Tennis Must 01/20/2022, 2:56 PM

## 2022-01-20 NOTE — Progress Notes (Signed)
Occupational Therapy Session Note ? ?Patient Details  ?Name: Allen Arias ?MRN: [id number] ?Date of Birth: Nov 22, 1940 ? ?Today's Date: 01/20/2022 ?OT Individual Time: 1130-1200 ?OT Individual Time Calculation (min): 30 min  ? ? ?Short Term Goals: ?Week 3:  OT Short Term Goal 1 (Week 3): Pt will complete LB dressing at bed level or sitting EOB with max A ?OT Short Term Goal 2 (Week 3): Pt will perform DABSC transfers with min A (SB or scoot) ?OT Short Term Goal 3 (Week 3): Pt will perform clothing mgmt tasks seated on DABSC with max A ? ?Skilled Therapeutic Interventions/Progress Updates:  ?  [employer] interpreter 300006. Pt resting in bed upon arrival. HOB at 45*. Supine>sit EOB using bed rails with supervision. Pt able to move BLE off EOB and push up from sidelying without assistance. Sitting balance EOB with supervision. OTA donned shoes for pt prior to SB transfer to w/c. SB tranfser to w/c with supervision. Pt able to remove SB without assistance. BUE therex with 4# bar; tapping beach ball 4x15 and chest presses 3x12. Pt remained seated in w/c with all needs within reach.  ? ?Therapy Documentation ?Precautions:  ?Precautions ?Precautions: Fall ?Precaution Comments: foley ?Restrictions ?Weight Bearing Restrictions: No ?  ?Pain: ? Pt denies pain this session ? ? ?Therapy/Group: Individual Therapy ? ?[name] ?01/20/2022, 12:13 PM ?

## 2022-01-20 NOTE — Progress Notes (Signed)
Physical Therapy Session Note ? ?Patient Details  ?Name: Allen Arias ?MRN: [id number] ?Date of Birth: 1941/09/01 ? ?Today's Date: 01/20/2022 ?PT Individual Time: 1010-1113 and 1400-1450 ?PT Individual Time Calculation (min): 63 min and 50 min ? ?Short Term Goals: ?Week 3:  PT Short Term Goal 1 (Week 3): Will be able to perform bed/chair transfer CGA with good demonstration of head/hips relationship ?PT Short Term Goal 2 (Week 3): Will be able to maintain dynamic sitting balance with CGA consistently ?PT Short Term Goal 3 (Week 3): Will be able to manage w/c parts to set chair up for transfer with Supervision and cueing ? ?Skilled Therapeutic Interventions/Progress Updates: Tx1: Pt presented in w/c agreeable to therapy. [employer] interpreter used during session Hassan Rowan [phone]. Session focused on w/c management and slide board transfers. Pt propelled to ortho gym with supervision. Pt required increased cues for sequencing w/c management and required minA for management of leg rests. Pt was able to place Slide board under RLE with increased time and effort. Mat was raised slightly above w/c height with pt requiring minA for Slide board transfer and max cues to try to push down with BLE. Due to downward slope pt was CGA to return to chair. On second attempt pt demonstrated improved sequencing and decreased cues however noted that pt would not adjust BLE without cues. Pt returned to mat with upward slope with improved biomechanics and almost CGA! This time on mat PTA reinforced attempting to push down with BLE as well as being more independent with BLE management. Pt noted to be able to abduct/adduct BLE independently in small range. Pt then participated in block practice lateral scoots L/R on mat with PTA facilitating increased downward pressure to engage quads. Pt was able to place Slide board under LLE with CGA and increased time and perform Slide board transfer back to w/c with CGA. With minA pt was able to place leg rests  back on w/c. Pt propelled back to room and performed Slide board transfer to bed with CGA and increased time. Pt noted to be incontinent of bladder. Pt performed rolling L/R with supervision and PTA performed peri-care total A. Pt left in bed at end of session with Nsg present and current needs met.  ? ?Tx2: Pt presented in bed agreeable to therapy. PTA checked brief prior to any mobility and noted to be incontinent of urine. Pt performed rolling L/R with supervision to R and minA to L to allow PTA to change brief and complete peri-care total A. Due to time constraints PTA discussed with pt use of e-stim to quads with pt agreeable. PTA set up e-stim NMES 10 sec on/5sec off with maximum intensity 40 where PTA noted slight contraction. Pt then performed SAQ with AA and NMES x 8 min. Pt then performed AA SAQ 2 x 10 on LLE. Once completed with use of [employer] video interpreter pt requesting to get OOB as anticipates will have company visiting. Pt performed supine to sit with minA for BLE as leg loops not applied, total A for Slide board set up and CGA for transfer. Pt left in w/c at end of session with belt alarm on, call bell within reach and needs met.  ?   ? ?Therapy Documentation ?Precautions:  ?Precautions ?Precautions: Fall ?Precaution Comments: foley ?Restrictions ?Weight Bearing Restrictions: No ?General: ?  ?Vital Signs: ?  ?Pain: ?  ?Mobility: ?  ?Locomotion : ?   ?Trunk/Postural Assessment : ?   ?Balance: ?  ?Exercises: ?  ?Other Treatments:   ? ? ? ?  Therapy/Group: Individual Therapy ? ?Roanna Reaves ?01/20/2022, 12:42 PM  ?

## 2022-01-20 NOTE — Progress Notes (Addendum)
Patient ID: Allen Arias, male   DOB: 20-Aug-1941, 81 y.o.   MRN: [id number] ? ?SW received message from pt dtr reporting they were continuing to wait on updates from [facility] to see if any beds, and she had not received follow-up from [facility]. Reports she continues to look at facilities.  ? ?SW returned phone call to pt dtr Allen Arias to provide updates from team conference, and discuss other preferred SNF. Reports they would like [facility]- SW shared referral was declined. SW discussed the likelihood of not getting a lot of other options based on his level of care needs. She is aware SW will follow-up with [facility] and [facility] to see if any potential options. She will f/u with SW tomorrow on other preferred SNFs. ? ?SW resent SNF referral to see if any potential bed offers.  ? ?SW left message for Allen Arias/Admissions with [facility] to follow-up. SW left messge for Allen Arias/[facility] to see if administrator considered short term rehab instead.  ? ?*SW received updates from Lorit/Admissions with [facility] and informed that they will only have a shared male room come available soon. SW called pt dtr Allen Arias to provide updates. She reports we will speak tomorrow.  ? ?Cecile Sheerer, MSW, LCSWA ?Office: [phone] ?Cell: [phone] ?Fax: [phone]  ?

## 2022-01-20 NOTE — Progress Notes (Signed)
Occupational Therapy Session Note ? ?Patient Details  ?Name: Allen Arias ?MRN: [id number] ?Date of Birth: 1941-03-14 ? ?Today's Date: 01/20/2022 ?OT Individual Time: 4268-3419 ?OT Individual Time Calculation (min): 71 min  ? ? ?Short Term Goals: ?Week 3:  OT Short Term Goal 1 (Week 3): Pt will complete LB dressing at bed level or sitting EOB with max A ?OT Short Term Goal 2 (Week 3): Pt will perform DABSC transfers with min A (SB or scoot) ?OT Short Term Goal 3 (Week 3): Pt will perform clothing mgmt tasks seated on DABSC with max A ? ?Skilled Therapeutic Interventions/Progress Updates:  ?  OT intervention with focus on bed mobility, sitting balance, SB tranfsers, and BLE activity to facilitate increased muscle activation. Pt incontinent of bladder. Tot A for toileting at bed level. Supine>sit EOB with min A for moving BLE off EOB. Sitting balance with supervision. Pt able to assist with donning shoes this morning. SB transfer to w/c with CGA. Pt completed UB bathing/dressing tasks at sink with supervision. BLE activity included knee extension tasks kicking small therapy ball and w/c marching; LLE>RLE. W/mobility room<>gym with supervision and more then a reasonable amount of time. Pt agreed to remain in w/c to await PT session. All needs within reach.  ? ?Therapy Documentation ?Precautions:  ?Precautions ?Precautions: Fall ?Precaution Comments: foley ?Restrictions ?Weight Bearing Restrictions: No ?  ?Pain: ? Pt denies pain this morning ? ? ?Therapy/Group: Individual Therapy ? ?[name] ?01/20/2022, 9:32 AM ?

## 2022-01-21 DIAGNOSIS — D649 Anemia, unspecified: Secondary | ICD-10-CM

## 2022-01-21 NOTE — Progress Notes (Signed)
?                                                       PROGRESS NOTE ? ? ?Subjective/Complaints: ?Still considering foley.  He says he will try to decide today. ? ?ROS: Limited by language.  Use of interpreter services patient was able to state no CP, SOB. ?Intermittent HA, urinary incontinence.  No fever, chills ? ? ?Objective: ?  ?No results found. ? ?Recent Labs  ?  01/19/22 ?[id number] 01/20/22 ?[id number]  ?WBC 5.6 6.9  ?HGB 7.7* 7.6*  ?HCT 24.4* 24.5*  ?PLT 213 216  ? ? ? ?Recent Labs  ?  01/19/22 ?[id number] 01/20/22 ?21300520  ?NA 137 137  ?K 3.9 3.9  ?CL 106 107  ?CO2 24 26  ?GLUCOSE 88 86  ?BUN 27* 29*  ?CREATININE 0.78 0.78  ?CALCIUM 8.5* 8.5*  ? ? ? ? ?Intake/Output Summary (Last 24 hours) at 01/21/2022 0737 ?Last data filed at 01/20/2022 1900 ?Gross per 24 hour  ?Intake 480 ml  ?Output 300 ml  ?Net 180 ml  ? ?  ? ?  ? ?Physical Exam: ? ?Gen: no distress, normal appearing ?HEENT: oral mucosa pink and moist, NCAT ?Cardio: RRR, no MRG ?Chest: CTAB, normal effort ?Abd: soft, non-distended ?Ext: no edema ?Psych: pleasant, normal affect ? ?GU: Incontinence of urine, wearing brief. ?Neurological: Alert ?Musc: No edema. 1/5 in RLE, 2-/5 in LLE ?Extremities: pitting LLE edema 1 + to L knee-trace on RLE ?Skin: Bilateral groin incision, flat, non-tender, non-swollen, skin glue,  ?RLE incision: flat, non-tender, non-swollen, skin glue ?Psychiatric appropriate-flattened affect ? ?Vital Signs ?Blood pressure 136/76, pulse 71, temperature 97.7 ?F (36.5 ?C), resp. rate 16, height 5\' 4"  (1.626 m), weight 52.4 kg, SpO2 98 %. ? ? ? ?Assessment/Plan: ?1. Functional deficits which require 3+ hours per day of interdisciplinary therapy in a comprehensive inpatient rehab setting. ?Physiatrist is providing close team supervision and 24 hour management of active medical problems listed below. ?Physiatrist and rehab team continue to assess barriers to discharge/monitor patient progress toward functional and medical goals ? ?Care Tool: ? ?Bathing ?    ?Body parts bathed by patient: Right arm, Left arm, Chest, Abdomen, Front perineal area, Right upper leg, Left upper leg, Face  ? Body parts bathed by helper: Buttocks, Right lower leg, Left lower leg ?Body parts n/a: Front perineal area ?  ?Bathing assist Assist Level: Moderate Assistance - Patient 50 - 74% ?  ?  ?Upper Body Dressing/Undressing ?Upper body dressing   ?What is the patient wearing?: Pull over shirt ?   ?Upper body assist Assist Level: Supervision/Verbal cueing ?   ?Lower Body Dressing/Undressing ?Lower body dressing ? ? ?   ?What is the patient wearing?: Pants, Incontinence brief ? ?  ? ?Lower body assist Assist for lower body dressing: Maximal Assistance - Patient 25 - 49% ?   ? ?Toileting ?Toileting    ?Toileting assist Assist for toileting: Dependent - Patient 0% ?  ?  ?Transfers ?Chair/bed transfer ? ?Transfers assist ?   ? ?Chair/bed transfer assist level: Minimal Assistance - Patient > 75% ?  ?  ?Locomotion ?Ambulation ? ? ?Ambulation assist ? ? Ambulation activity did not occur: Safety/medical concerns ? ?  ?  ?   ? ?Walk 10 feet activity ? ? ?Assist ? Walk 10 feet activity  did not occur: Safety/medical concerns ? ?  ?   ? ?Walk 50 feet activity ? ? ?Assist Walk 50 feet with 2 turns activity did not occur: Safety/medical concerns ? ?  ?   ? ? ?Walk 150 feet activity ? ? ?Assist Walk 150 feet activity did not occur: Safety/medical concerns ? ?  ?  ?  ? ?Walk 10 feet on uneven surface  ?activity ? ? ?Assist Walk 10 feet on uneven surfaces activity did not occur: Safety/medical concerns ? ? ?  ?   ? ?Wheelchair ? ? ? ? ?Assist Is the patient using a wheelchair?: Yes ?Type of Wheelchair: Manual ?  ? ?Wheelchair assist level: Supervision/Verbal cueing ?Max wheelchair distance: 150'  ? ? ?Wheelchair 50 feet with 2 turns activity ? ? ? ?Assist ? ?  ?  ? ? ?Assist Level: Supervision/Verbal cueing  ? ?Wheelchair 150 feet activity  ? ? ? ?Assist ?   ? ? ?Assist Level: Supervision/Verbal cueing   ? ?Blood pressure 136/76, pulse 71, temperature 97.7 ?F (36.5 ?C), resp. rate 16, height 5\' 4"  (1.626 m), weight 52.4 kg, SpO2 98 %. ? ?Medical Problem List and Plan: ?1. 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 ?            - Patient may shower but incisions must be covered. ?             -ELOS/Goals: Min 10 to 14 days ?-Social work working on options for SNF ?-Continue CIR OT/PT  ?2.  Antithrombotics: ?-DVT/anticoagulation: Mechanical: Sequential compression devices, below knee bilateral lower extremities ?- Check Dopplers in a.m. ?-01/01/2022 cannot be on Lovenox or Eliquis due to having low platelets with a risk of bleeding.  However if his platelets get above 100 K may consider starting.   ?-4/27 Vas 5/27: Negative for DVT bilateral ?-4/29 Continue SCD's, while trending Plts ?-4/29 Plts today 132, up from 73 on 4/27, continue to trend to    ensure stays above 100K ?-4/30 Plts at 154 today, continue to trend with am labs ?5/1- plts 206- will start Lovenox since high risk for DVT and recheck labs Thursday.  ?5/5 plts 220- continue to monitor ?5/6 no new labs but will trend platelets with the q. Monday labs. ?5/9- plts 187k- con't lovenox ?5/11- will need lovenox for a total of 3 months from SCI- so ~ 03/26/22 ? ? ?  Latest Ref Rng & Units 01/20/2022  ?  5:20 AM 01/19/2022  ?  5:14 AM 01/12/2022  ?  5:31 AM  ?CBC  ?WBC 4.0 - 10.5 K/uL 6.9   5.6   2.4    ?Hemoglobin 13.0 - 17.0 g/dL 7.6   7.7   9.3    ?Hematocrit 39.0 - 52.0 % 24.5   24.4   28.6    ?Platelets 150 - 400 K/uL 216   213   187    ?  ? -antiplatelet therapy:N/A ?3. Pain Management/Post-Op: Start Tylenol 650 mg 3 times daily.  Oxycodone as needed. ?4. Mood: Team to provide a good support.  May need antidepressant-family and patient some disappointed and had multiple questions anxiety with regards to recovery and interventions potentially seeking second opinion etc.  Discussed with Dr. 03/14/2022 specially on spinal cord pathology. ?-01/01/2022  we will start Celexa 20 mg daily for mood. ?-4/29 Mood appropriate today. ?5/10- more sad today, but said he's "tolerable".  ?5/14 Okay spirits today. ?- Antipsychotic agents: Not applicable ?5. Neuropsych: This patient may be capable  of making decisions on his own behalf. ?6. Skin/Wound Care: Routine pressure relief measures.  ?            --Gerhadt's cream to scrotal breakdown ?           -4/30-Continue use of cream as protective barrier. ?5/10- is healing per staff- con't regimen  ?5/14 improved per nursing, continue barrier cream. ?5/16 Discussed foley may help keep skin dry and prevent  breakdown ?5/17 Discussed further how urinary incontinence can lead to skin breakdown ?7. Fluids/Electrolytes/Nutrition: Routine in and outs with follow-up chemistries ?-4/29 continue to check Cmet ?8.  History of BPH/urinary retention.  Continue to hold Hytrin and Flomax for now. ?- Monitor BP for any orthostatic changes/drops. ?-4/30 Pending orthostatic vitals.  Pt voiding adequately s/p Foley removal. ?9.  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     -Has varied from 100 down to 44 down to 56 ?-4/29 Plts today 132, up from 73 on 4/27, continue to trend to    ensure stays above 100K ?-4/30 Plts at 154 today, continue to trend with am labs ?5/1- Plts 206- start Lovenox ?5/6 patient to continue Lovenox no bleeding issues. ?5/9- Plts 187k- doing well- cont' lovneox ?5/11- use lovenox until 03/26/22 to prevent DVT then can stop ?5/13 Continue plan above. ?5/16 plts 216, continue lovenox ?10.  ABLA: Monitor H/H for stability. ?- Improved from 5.7-10.  Recheck CBC in AM. ?-01/01/22 hemoglobin 9.1. ?-01/03/22 today hemoglobin has dropped to 7.0 there are no obvious signs of bleeding.  Patient does not report seeing visible blood in stool or urine.  New orders for stool guaiac x3 UA to rule out blood in urine repeat H&H and orthostatic blood pressure to monitor for symptoms. ?-4/29 U/A negative for blood in  urine. ?-4/30 Hgb up to 7.4 today from 7.0 yesterday, CBC in am to trend. Pending stool guaiac.  ?5/1- Hb 8.1 so don't know why it dropped so much- con't to monitor ?5/4- HGB 8.4 today, appears to be stable, continue to m

## 2022-01-21 NOTE — Progress Notes (Signed)
Patient not present on unit due to daughter taking patient off unit. Report was given to on-coming shift to follow up. ?Cletis Media, LPN ? ?

## 2022-01-21 NOTE — Plan of Care (Signed)
?  Problem: Consults ?Goal: RH STROKE PATIENT EDUCATION ?Description: See Patient Education module for education specifics  ?Outcome: Progressing ?  ?Problem: RH BOWEL ELIMINATION ?Goal: RH STG MANAGE BOWEL WITH ASSISTANCE ?Description: STG Manage Bowel with Min Assistance. ?Outcome: Progressing ?Goal: RH STG MANAGE BOWEL W/MEDICATION W/ASSISTANCE ?Description: STG Manage Bowel with Medication with Min Assistance. ?Outcome: Progressing ?  ?Problem: RH BLADDER ELIMINATION ?Goal: RH STG MANAGE BLADDER WITH ASSISTANCE ?Description: STG Manage Bladder With Min Assistance ?Outcome: Progressing ?Goal: RH STG MANAGE BLADDER WITH MEDICATION WITH ASSISTANCE ?Description: STG Manage Bladder With Medication With Min Assistance. ?Outcome: Progressing ?  ?Problem: RH SKIN INTEGRITY ?Goal: RH STG MAINTAIN SKIN INTEGRITY WITH ASSISTANCE ?Description: STG Maintain Skin Integrity With Min Assistance. ?Outcome: Progressing ?Goal: RH STG ABLE TO PERFORM INCISION/WOUND CARE W/ASSISTANCE ?Description: STG Able To Perform Incision/Wound Care With Min Assistance. ?Outcome: Progressing ?  ?Problem: RH SAFETY ?Goal: RH STG ADHERE TO SAFETY PRECAUTIONS W/ASSISTANCE/DEVICE ?Description: STG Adhere to Safety Precautions With Cues and Reminders. ?Outcome: Progressing ?Goal: RH STG DECREASED RISK OF FALL WITH ASSISTANCE ?Description: STG Decreased Risk of Fall With Min Assistance. ?Outcome: Progressing ?  ?

## 2022-01-21 NOTE — Progress Notes (Signed)
Physical Therapy Session Note ? ?Patient Details  ?Name: Allen Arias ?MRN: [id number] ?Date of Birth: 18-Apr-1941 ? ?Today's Date: 01/21/2022 ?PT Individual Time: 0950-1100 and [phone] ?PT Individual Time Calculation (min): 70 min and 73 min ? ?Short Term Goals: ?Week 3:  PT Short Term Goal 1 (Week 3): Will be able to perform bed/chair transfer CGA with good demonstration of head/hips relationship ?PT Short Term Goal 2 (Week 3): Will be able to maintain dynamic sitting balance with CGA consistently ?PT Short Term Goal 3 (Week 3): Will be able to manage w/c parts to set chair up for transfer with Supervision and cueing ? ?Skilled Therapeutic Interventions/Progress Updates: Tx1: Pt presented in bed agreeable to therapy. [employer] interpreter used Perley Jain #[id number]. PTA donned leg loops total A and pt performed supine to sit with supervision, use of leg loops and bed features, and increased time. Pt continues to require intermittent cues for sequencing in preparation for Slide board transfer. Pt was able to transfer to w/c to CGA. Pt transported to rehab gym for energy conservation. Participated in Sit to stand in parallel bars requiring maxA to come to stand but pt was able to tolerate weight bearing through LLE but heavily relied on BUE. With max multimodal cues pt was able to perform TKE in RLE x 10. PTA facilitated lateral weight shifting and improved postural control with facilitation of anterior translation of hips. Pt was able to tolerate x 3 stands before significant fatigue noted. Pt then transported to day room and after w/c locked in place had pt initiate sequencing for slide board transfer. Pt required cues for scoot forward in w/c prior to setting up Slide board however pt was able to perform all other sequencing without cues and was able to perform transfer with CGA. At [facility] pt participated in anterior leans with PTA and/or chair in front of pt to encourage pt to push through BLE and facilitate initiating lifting  buttocks off mat x 5. Pt also participated in kicking ball with LLE x 20 and AA LAQ of RLE with x 20. Prior to returning to w/c pt required minA for Slide board placement but was able to transfer back to w/c with CGA. Pt propelled to 4W/MW corridor >171f with supervision and transported remaining distance back to room. Performed Slide board transfer back to bed with in same manner as prior and pt required light minA fot RLE management for sit to supine. Pt left in bed at end of session with bed alarm on, call bell within reach and needs met.  ? ?Tx2: Pt presented in bed agreeable to therapy. PTA checked brief prior to pt exiting bed and noted to be incontinent of urine. Pt performed rolling L/R with supervision to R and minA to L to doff/don brief and perform peri-care. PTA then performed stretching to hamstrings 30sec x 3 bilaterally with hamstring tightness L>R. Via [employer]atus interpreter Yumi #[phone] requesting to perform ADL's, upon further discussion pt requesting to be able to be more independent with activities. PTA explained that PT more geared towards balance, LE strength, transfers, etc. However during PT sessions various therapists have been attempting in incorporate activities such as long and circle sitting to allow more independence in activities such as threading pants, donning socks and shoes. Pt verbalized understanding. Pt then performed supine to sit with CGA and use of bed features (leg loops were not donned prior). Pt required minA for Slide board set up but was able to transfer to w/c with CGA. Pt  propelled to ortho gym with supervision and increased time. Pt transferred to mat with minA for set up and CGA for transfer. Pt transferred to long sit on mat with minA with noted posterior lean. With assistance pt was able to correct to more neutral sitting however noted min posterior bias throughout. Pt performed various activities with use of leg loops such as placing in near circle sit position,  returning to long sit and reaching down legs, moving hands inside "circle" then out to hips while trying to maintain neutral sitting, and using kickball for reach forward then returning to upright sitting. Pt did require intermittent rest between activities. Pt was able to return to sitting EOB with minA as L shoe caused increased friction to mat. Pt transferred back to w/c in same manner as prior and pt transported back to room due to time. In room pt transferred back to bed in same manner as prior and required minA for sit to supine for RLE management and fatigue. Pt repositioned to comfort and left with bed alarm on, call bell within reach and needs met.  ?   ? ?Therapy Documentation ?Precautions:  ?Precautions ?Precautions: Fall ?Precaution Comments: foley ?Restrictions ?Weight Bearing Restrictions: No ?General: ?  ?Vital Signs: ?  ?Pain: ?  ?Mobility: ?  ?Locomotion : ?   ?Trunk/Postural Assessment : ?   ?Balance: ?  ?Exercises: ?  ?Other Treatments:   ? ? ? ?Therapy/Group: Individual Therapy ? ?Vincen Bejar ?01/21/2022, 12:16 PM  ?

## 2022-01-21 NOTE — Progress Notes (Signed)
Occupational Therapy Session Note ? ?Patient Details  ?Name: Allen Arias ?MRN: [id number] ?Date of Birth: 26-Apr-1941 ? ?Today's Date: 01/21/2022 ?OT Individual Time: [phone] ?OT Individual Time Calculation (min): 73 min  ? ? ?Short Term Goals: ?Week 3:  OT Short Term Goal 1 (Week 3): Pt will complete LB dressing at bed level or sitting EOB with max A ?OT Short Term Goal 2 (Week 3): Pt will perform DABSC transfers with min A (SB or scoot) ?OT Short Term Goal 3 (Week 3): Pt will perform clothing mgmt tasks seated on DABSC with max A ? ?Skilled Therapeutic Interventions/Progress Updates:  ?  [employer] interpreter [phone]. Pt resting in bed upon arrival. MD present during rounds. Pt incontinent of bladder upon arrival. Tot A for toileting tasks at bed level. Max A for LB dressing at bed level. Supine>sit EOB with CGA using bed rails. Sitting balance EOB with supervision. Pt assisted with donning shoes before SB transfer to w/c. Pt completed all UB bathing/dressing tasks with supervision. Pt requested to practice SB transfers so he can perform task independently. Pt transitioned to gym and practiced SB tranfsers w/c<>EOM x 3 including placement and removal of SB. Pt performed all transfers with CGA/close supervision. Pt returned to bed and tranfserred to bed. Pt remained in bed with all needs within reach and bed alarm activated.  ? ?Therapy Documentation ?Precautions:  ?Precautions ?Precautions: Fall ?Precaution Comments: foley ?Restrictions ?Weight Bearing Restrictions: No ?  ?Pain: ? Pt denies pain this morning ? ? ?Therapy/Group: Individual Therapy ? ?[name] ?01/21/2022, 9:19 AM ?

## 2022-01-21 NOTE — Progress Notes (Signed)
Patient ID: Allen Arias, male   DOB: 10/08/1940, 81 y.o.   MRN: [id number] ? ?SW left message for Soy/Admissions with [facility] and Star/Admissions with [facility] to discuss bed offer. SW waiting on follow-up.  ? ?SW spoke with pt dtr [name] to discuss SNF options- [facility], [address] and [facility]. Reports that she will review locations that are willing to extend a bed offer. States that she would like SW to speak with [facility] again.  ? ?SW spoke with Whitney/Admissions with [facility] and she reported unless the family will be here 24/7, they could not accept the translator they will provide.  ? ?SW shared this with pt dtr Seonjiin. SW provided a list of SNF locations that extended bed offer. States she intends to view and will have her father look over the list and come to a conclusion.  ? ? ?Loralee Pacas, MSW, LCSWA ?Office: [phone] ?Cell: [phone] ?Fax: [phone]  ?

## 2022-01-22 DIAGNOSIS — G4700 Insomnia, unspecified: Secondary | ICD-10-CM

## 2022-01-22 LAB — OCCULT BLOOD X 1 CARD TO LAB, STOOL: Fecal Occult Bld: NEGATIVE

## 2022-01-22 NOTE — Progress Notes (Signed)
PROGRESS NOTE   Subjective/Complaints: Declines foley.  Reports he slept well, no pain.   ROS: Limited by language.  Use of interpreter services patient was able to state no CP, SOB. , urinary incontinence.  No fever, chills. HA improved   Objective:   No results found.  Recent Labs    01/20/22 0520  WBC 6.9  HGB 7.6*  HCT 24.5*  PLT 216     Recent Labs    01/20/22 0520  NA 137  K 3.9  CL 107  CO2 26  GLUCOSE 86  BUN 29*  CREATININE 0.78  CALCIUM 8.5*      Intake/Output Summary (Last 24 hours) at 01/22/2022 1201 Last data filed at 01/22/2022 0700 Gross per 24 hour  Intake 198 ml  Output --  Net 198 ml         Physical Exam:  Gen: no distress, normal appearing HEENT: oral mucosa pink and moist, NCAT Cardio: RRR, no MRG Chest: CTAB, normal effort Abd: soft, non-distended Ext: no edema Psych: pleasant, normal affect  GU: Incontinence of urine, wearing brief. Neurological: Alert, follows commands Musc: No edema. 1/5 in RLE, 2-/5 in LLE Extremities: pitting LLE edema 1 + to L knee-trace on RLE Skin: Bilateral groin incision, flat, non-tender, non-swollen, skin glue,  RLE incision: flat, non-tender, non-swollen, skin glue Psychiatric appropriate-flattened affect, pleasant  Vital Signs Blood pressure 136/73, pulse 67, temperature 98.3 F (36.8 C), temperature source Oral, resp. rate 17, height 5\' 4"  (1.626 m), weight 48.4 kg, SpO2 100 %.    Assessment/Plan: 1. Functional deficits which require 3+ hours per day of interdisciplinary therapy in a comprehensive inpatient rehab setting. Physiatrist is providing close team supervision and 24 hour management of active medical problems listed below. Physiatrist and rehab team continue to assess barriers to discharge/monitor patient progress toward functional and medical goals  Care Tool:  Bathing    Body parts bathed by patient: Right arm, Left  arm, Chest, Abdomen, Front perineal area, Right upper leg, Left upper leg, Face   Body parts bathed by helper: Buttocks, Right lower leg, Left lower leg Body parts n/a: Front perineal area   Bathing assist Assist Level: Moderate Assistance - Patient 50 - 74%     Upper Body Dressing/Undressing Upper body dressing   What is the patient wearing?: Pull over shirt    Upper body assist Assist Level: Supervision/Verbal cueing    Lower Body Dressing/Undressing Lower body dressing      What is the patient wearing?: Pants, Incontinence brief     Lower body assist Assist for lower body dressing: Maximal Assistance - Patient 25 - 49%     Toileting Toileting    Toileting assist Assist for toileting: Dependent - Patient 0%     Transfers Chair/bed transfer  Transfers assist     Chair/bed transfer assist level: Contact Guard/Touching assist     Locomotion Ambulation   Ambulation assist   Ambulation activity did not occur: Safety/medical concerns          Walk 10 feet activity   Assist  Walk 10 feet activity did not occur: Safety/medical concerns        Walk 50 feet  activity   Assist Walk 50 feet with 2 turns activity did not occur: Safety/medical concerns         Walk 150 feet activity   Assist Walk 150 feet activity did not occur: Safety/medical concerns         Walk 10 feet on uneven surface  activity   Assist Walk 10 feet on uneven surfaces activity did not occur: Safety/medical concerns         Wheelchair     Assist Is the patient using a wheelchair?: Yes Type of Wheelchair: Manual    Wheelchair assist level: Supervision/Verbal cueing Max wheelchair distance: 150'    Wheelchair 50 feet with 2 turns activity    Assist        Assist Level: Supervision/Verbal cueing   Wheelchair 150 feet activity     Assist      Assist Level: Supervision/Verbal cueing   Blood pressure 136/73, pulse 67, temperature 98.3 F  (36.8 C), temperature source Oral, resp. rate 17, height 5\' 4"  (1.626 m), weight 48.4 kg, SpO2 100 %.  Medical Problem List and Plan: 1. 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             - Patient may shower but incisions must be covered.              -ELOS/Goals: Min 10 to 14 days. Expected discharge 5/25,  Mod A to mod I -Social work working on options for SNF -Continue CIR OT/PT  2.  Antithrombotics: -DVT/anticoagulation: Mechanical: Sequential compression devices, below knee bilateral lower extremities - Check Dopplers in a.m. -01/01/2022 cannot be on Lovenox or Eliquis due to having low platelets with a risk of bleeding.  However if his platelets get above 100 K may consider starting.   -4/27 Vas 05-[birth year]-1976: Negative for DVT bilateral -4/29 Continue SCD's, while trending Plts -4/29 Plts today 132, up from 73 on 4/27, continue to trend to    ensure stays above 100K -4/30 Plts at 154 today, continue to trend with am labs 5/1- plts 206- will start Lovenox since high risk for DVT and recheck labs Thursday.  5/5 plts 220- continue to monitor 5/6 no new labs but will trend platelets with the q. Monday labs. 5/9- plts 187k- con't lovenox 5/11- will need lovenox for a total of 3 months from [facility]- so ~ 03/26/22     Latest Ref Rng & Units 01/20/2022    5:20 AM 01/19/2022    5:14 AM 01/12/2022    5:31 AM  CBC  WBC 4.0 - 10.5 K/uL 6.9   5.6   2.4    Hemoglobin 13.0 - 17.0 g/dL 7.6   7.7   9.3    Hematocrit 39.0 - 52.0 % 24.5   24.4   28.6    Platelets 150 - 400 K/uL 216   213   187       -antiplatelet therapy:N/A 3. Pain Management/Post-Op: Start Tylenol 650 mg 3 times daily.  Oxycodone as needed. 4. Mood: Team to provide a good support.  May need antidepressant-family and patient some disappointed and had multiple questions anxiety with regards to recovery and interventions potentially seeking second opinion etc.  Discussed with Dr. 03/14/2022 specially on spinal cord  pathology. -01/01/2022 we will start Celexa 20 mg daily for mood. -4/29 Mood appropriate today. 5/10- more sad today, but said he's "tolerable".  5/14 Okay spirits today. - Antipsychotic agents: Not applicable 5. Neuropsych: This patient may be capable of making decisions  on his own behalf. 6. Skin/Wound Care: Routine pressure relief measures.              --Gerhadt's cream to scrotal breakdown            -4/30-Continue use of cream as protective barrier. 5/10- is healing per staff- con't regimen  5/14 improved per nursing, continue barrier cream. 5/16 Discussed foley may help keep skin dry and prevent  breakdown 5/17 Discussed further how urinary incontinence can lead to skin breakdown 7. Fluids/Electrolytes/Nutrition: Routine in and outs with follow-up chemistries -4/29 continue to check Cmet 8.  History of BPH/urinary retention.  Continue to hold Hytrin and Flomax for now. - Monitor BP for any orthostatic changes/drops. -4/30 Pending orthostatic vitals.  Pt voiding adequately s/p Foley removal. 9.  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 5/9- Plts 187k- doing well- cont' lovneox 5/11- use lovenox until 03/26/22 to prevent DVT then can stop 5/13 Continue plan above. 5/16 plts 216, continue lovenox 10.  ABLA: Monitor H/H for stability. - Improved from 5.7-10.  Recheck CBC in AM. -01/01/22 hemoglobin 9.1. -01/03/22 today hemoglobin has dropped to 7.0 there are no obvious signs of bleeding.  Patient does not report seeing visible blood in stool or urine.  New orders for stool guaiac x3 UA to rule out blood in urine repeat H&H and orthostatic blood pressure to monitor for symptoms. -4/29 U/A negative  for blood in urine. -4/30 Hgb up to 7.4 today from 7.0 yesterday, CBC in am to trend. Pending stool guaiac.  5/1- Hb 8.1 so don't know why it dropped so much- con't to monitor 5/4- HGB 8.4 today, appears to be stable, continue to monitor  5/5 hgb stable at 8.5, continue to monitor  5/6 we will continue to trend hemoglobin on q. Monday labs 5/9- Hb up to 9.3- cont' to monitor 5/14 Continue to trend with qMonday labs, daughter asked about labs.  Let her know that he gets weekly labs. 5/17 HGB decrease noted on 5/15, repeat on 5/16 stable, no signs of bleeding, check stool guaiac continue to monitor    Latest Ref Rng & Units 01/20/2022    5:20 AM 01/19/2022    5:14 AM 01/12/2022    5:31 AM  CBC  WBC 4.0 - 10.5 K/uL 6.9   5.6   2.4    Hemoglobin 13.0 - 17.0 g/dL 7.6   7.7   9.3    Hematocrit 39.0 - 52.0 % 24.5   24.4   28.6    Platelets 150 - 400 K/uL 216   213   187      11.  Insomnia: Slept well last night with current trazodone.  Continue as needed.  -4/30 Pt reports that trazodone ineffective for sleep.  Trial of 3 mg melatonin to if this works better for the patient. 5/3- will change to Ambien 5 mg QHS- due ot age, cannot do a higher dose.              5/4 insomnia improved last night             5/5 insomnia continues to be improved, follow  5/6 patient says slept well with current medication of Ambien 5 mg nightly continue this plan. 5/10- sleeping well with Ambien 5/13 No issues with sleep. 5/18 reports good sleep 12.  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 - Check KUB for constipation/stool burden burden. - 01/01/22-start bowel program q. nightly with digital stimulation with suppository.  Educated patient and son on bowel program. 4/28-has not been eating but has had small bowel movement with the bowel program.   -4/29 has had stool today -4/30 No constipation reported, LBM 4/30 5/1- not clear if had BM/bowel program last  night- have called nursing to figure out what's going on with bowel program             5/2- BM last night with program- will move to q2 days since pt says at home was q2-4 days would have BM- 5/6 patient reports BM 2 days ago which is not uncommon for his normal.  Continue bowel program as prescribed.  5/10- good results with bowel program QOD 5/13 Continue bowel program, reports BM 2 days ago. 5/17 last BM yesterday 13.  Neurogenic bladder: Had Foley due to urinary retention and low blood pressure -01/01/2022 plans to remove Foley in the morning on 428 and start Flomax 0.4 mg q. nightly because he was having low blood pressure up until now-we will monitor and stop if BP goes low again. -01/02/2022 Foley was removed around lunchtime. -01/03/2022 patient has voided twice since Foley removal however because patient is incontinent these were recorded as an occurrence instead of a volume.  Patient family and nurse said that he has been able to void without issue since Foley has been removed. -4/30 Patient continues to void but is incontinent r/t neurogenic bladder    5/1- think voiding is overflow- need bladder scans to be clear.     5/2- change bladder scans to q4 hours- and see if can void with urinal?    5/3- voiding incontinently- worried will have more skin breakdown- will stop 5/6 continue to monitor voiding with of every 4 hour bladder scans patient is not reporting issues with voiding today however continue to monitor for possible overflow. /9- voiding but incontinent- I think it's overflow- but not retaining much- less than 300cc when scanned. 5/11- d/w colleague in [facility]- pt doesn't want foley/"tube" because will stop him from regaining bladder function, even though discussed don't know if/when that would occur.  Thought about flomax to make sure empties better; vs Myrbetriq, but no bladder scans or in/out at nursing homes. D/w colleague more before making a decision- will send ot Urology  outpatient after d/c.  5/12- discussed with pt- Foley- because of constant incontinence- concern for skin breakdown.  5/13 Tried to reach family but only came on-site for brief period, with attempt to reach out to family again.  Patient says "he will think about it" wrt Foley. 5/14 Daughter present at bedside but has some additional questions while considering Foley, specific to neurogenic bladder.  Requesting doctor rounding on Monday to reach out to her. 5/16 Discussed benefits and risks of foley, initially pt declines but later says he will  consider this 5/17 Continue to discuss foley, pt says he will decide by tomorrow 5/18 Pt declines foley, discussed possible skin and GU risks 14.  Azotemia - 01/01/2022 BUN 38 was 21-we will give IVF's-50 cc/h and recheck in a.m. if not better tomorrow, will not remove Foley. 01/02/2022 BUN down to 33-we will  continue IVF's for the rest of the day today and remove Foley this afternoon patient advised to drink more and recheck labs Monday morning 01/05/2022 -4/29 BUN trending down at 33 (4/28) from 38 on 4/27 -4/30 qMonday labs to trend 5/1- BUN down to 24- con't to push fluids 5/4 BUN down to 23, continue to monitor 5/6 BUN at 24 creatinine at 0.81 encourage fluids adequate hydration. 5/7 trend with qMonday labs. 5/9- BUN 25- slightly dry but doesn't want IVFs- will push PO 5/16 BUN 29, encourage PO intake     Latest Ref Rng & Units 01/20/2022    5:20 AM 01/19/2022    5:14 AM 01/12/2022    5:31 AM  BMP  Glucose 70 - 99 mg/dL 86   88   96    BUN 8 - 23 mg/dL Creatinine 0.61 - 1.24 mg/dL 1.61   0.96   0.45    Sodium 135 - 145 mmol/L 137   137   133    Potassium 3.5 - 5.1 mmol/L 3.9   3.9   4.1    Chloride 98 - 111 mmol/L 107   106   103    CO2 22 - 32 mmol/L Calcium 8.9 - 10.3 mg/dL 8.5   8.5   8.6      15.  Cholelithiasis-  -4/28 no acute cholecystitis 16. LE edema- nonpitting             5/3- will reorder TEDs-  thigh highs. For during day.              5/4- OT to provide teds today             5/6 continue to implement TEDs with help from OT             5/7 Worked with PT today, continue use of TED hose. 17.UTI             -5/5 Keflex was started for treatment             -Follow culture results           5/6 continue Keflex culture results are still pending we will follow up once these are posted. 5/7 Urine culture back, >10,000 E. Coli, pan sensitive.  Since Keflex good for E. Coli will continue current treatment.             5/9- PO ABX for 7 days due to complicated UTI             5/12- finished Po ABX 18. Headaches 5/14 Patient reports today that he has had nighttime headaches, several nights while here in Rehab.   -He has had no unusual spikes in b/p, denies any other symptoms occurring with his headaches. -He has been given Tylenol, but this hasn't worked well.  Requested Alleve, but did have one elevated Crt 1.30 (4/22).  NSAID's may be safe, 3 weeks out, and Crt normalized.  But will start a trial of Fioricet q6h prn first. -start topamax  HS -5/18 HA improved today 20. Lower extremity weakness: discussed with Ladona Ridgel and Tom trying e-stim   LOS: 22 days A FACE TO FACE EVALUATION WAS PERFORMED  Fanny Dance, MD 01/22/2022, 12:01 PM

## 2022-01-22 NOTE — Progress Notes (Signed)
Patient ID: Allen Arias, male   DOB: 07-Jan-1941, 81 y.o.   MRN: [id number]  SW received message from pt dtr [name] that she was going to view more SNFs today and will confirm preference, so far [facility] and [facility].   Cecile Sheerer, MSW, LCSWA Office: [phone] Cell: [phone] Fax: [phone]

## 2022-01-22 NOTE — Progress Notes (Signed)
Physical Therapy Session Note  Patient Details  Name: Allen Arias MRN: [id number] Date of Birth: 27-Sep-1940  Today's Date: 01/22/2022 PT Individual Time: 0950-1047 PT Individual Time Calculation (min): 57 min   Short Term Goals: Week 3:  PT Short Term Goal 1 (Week 3): Will be able to perform bed/chair transfer CGA with good demonstration of head/hips relationship PT Short Term Goal 2 (Week 3): Will be able to maintain dynamic sitting balance with CGA consistently PT Short Term Goal 3 (Week 3): Will be able to manage w/c parts to set chair up for transfer with Supervision and cueing  Skilled Therapeutic Interventions/Progress Updates: Pt presented in w/c agreeable to therapy. Pt denies pain. [employer] interpreter used Benjamine Mola [phone]. Pt propelled w/c to ortho gym with supervision. Pt was able to set up Slide board with supervision and increased time. Performed Slide board transfer to mat with CGA and increased time. Pt then performed block practice Slide board transfers from both L and R including Slide board set up. Pt did require increased time but was able perform Slide board set up and transfers with CGA to close supervision overall. Pt then participated in anterior reaching rolling out 65cm physioball x 5. Pt demonstrated improved core activation returning to midline and pt was able to bring hands to upper thigh for short period without touching anything. Pt also performed forward reaching to shins then returning to lap x 3 with minA and PTA heavily guarding. Pt then transferred back to w/c in same manner as prior and transported back to room for time management. Pt performed Slide board transfer to bed with minA as pt pulled on bed rail which caused Slide board to slip forward and almost off w/c. Pt was able to performed block practice placing BLE onto and off of bed and was able to bring both BLE onto bed x 2 without assist!. Pt respositioned in bed for comfort and left with bed alarm on, call bell  within reach and needs met.      Therapy Documentation Precautions:  Precautions Precautions: Fall Precaution Comments: foley Restrictions Weight Bearing Restrictions: No General:   Vital Signs:   Pain:   Mobility:   Locomotion :    Trunk/Postural Assessment :    Balance:   Exercises:   Other Treatments:      Therapy/Group: Individual Therapy  Elwyn Klosinski 01/22/2022, 11:21 AM

## 2022-01-22 NOTE — Progress Notes (Signed)
Occupational Therapy Weekly Progress Note  Patient Details  Name: Allen Arias MRN: [id number] Date of Birth: May 25, 1941  Beginning of progress report period: Jan 15, 2022 End of progress report period: Jan 22, 2022   Patient has met 2 of 3 short term goals.  Pt continues to make steady progress with bed mobility, sitting balance, SB transfers, BADLs, and safety awareness. Pt currently requires max A for LB bathing/dressing tasks at bed level and sitting EOB. Bed<>DABSC SB transfers with min A/mod A. Clothing mgmt with max A. Bed<>w/c SB transfers with CGA/close supervision after setup. Pt completes UB bathing dressing tasks seated EOB or w/c at sink with supervision. Pt's family unable to provide level of assist and pt awaiting SNF placement.   Patient continues to demonstrate the following deficits: muscle weakness and muscle paralysis, decreased cardiorespiratoy endurance, impaired timing and sequencing, unbalanced muscle activation, and decreased coordination, and decreased sitting balance, decreased standing balance, decreased postural control, and decreased balance strategies and therefore will continue to benefit from skilled OT intervention to enhance overall performance with BADL and Reduce care partner burden.  Patient progressing toward long term goals..  Continue plan of care.  OT Short Term Goals Week 3:  OT Short Term Goal 1 (Week 3): Pt will complete LB dressing at bed level or sitting EOB with max A OT Short Term Goal 1 - Progress (Week 3): Met OT Short Term Goal 2 (Week 3): Pt will perform DABSC transfers with min A (SB or scoot) OT Short Term Goal 2 - Progress (Week 3): Met OT Short Term Goal 3 (Week 3): Pt will perform clothing mgmt tasks seated on DABSC with max A OT Short Term Goal 3 - Progress (Week 3): Progressing toward goal Week 4:  OT Short Term Goal 1 (Week 4): STG=LTG 2/2 ELOS   [name] [facility] 01/22/2022, 6:54 AM

## 2022-01-22 NOTE — Progress Notes (Signed)
Occupational Therapy Session Note  Patient Details  Name: Allen Arias MRN: [id number] Date of Birth: 1941/02/14  Today's Date: 01/22/2022 OT Individual Time: 0160-1093 OT Individual Time Calculation (min): 72 min    Short Term Goals: Week 4:  OT Short Term Goal 1 (Week 4): STG=LTG 2/2 ELOS  Skilled Therapeutic Interventions/Progress Updates:    [employer] interpreter 300007 (Jeng.) Pt incontinent of bladder and brief completely soaked front and back. Pad also wet. Hygiene and LB dressing performed at bed level. Bedding stripped and RN notified. Min A for rolling R/L in bed to facilitate donning brief and pants. Supine>sit EOB with bed flat at supervision level using bed rails. Pt able to move BLE off EOB without assistance this morning. Min A for donning shoes this morning. SB transfer to w/c with supervision after board placement. Pt propelled w/c to gym and transferred to EOM. [name] w/c replaced with std w/c. OTA demonstrated placement and removal of leg rests and arm rest. Modifications made to Lt leg rest to provide support for LLE. Pt propelled w/c back to room with supervision. Pt remained in w/c with all needs within reach.   Therapy Documentation Precautions:  Precautions Precautions: Fall Precaution Comments: foley Restrictions Weight Bearing Restrictions: No   Pain:  Pt denies pain this morning   Therapy/Group: Individual Therapy  [name] 01/22/2022, 9:34 AM

## 2022-01-22 NOTE — Progress Notes (Signed)
Occupational Therapy Session Note  Patient Details  Name: Allen Arias MRN: [id number] Date of Birth: December 04, 1940  Today's Date: 01/22/2022 OT Individual Time: 1300-1403 OT Individual Time Calculation (min): 63 min    Short Term Goals: Week 4:  OT Short Term Goal 1 (Week 4): STG=LTG 2/2 ELOS  Skilled Therapeutic Interventions/Progress Updates:    Pt resting in bed upon arrival. Pt incontinent of bladder; brief, pants, pad, and shirt were wet. Initial focus on bed mobility to roll R/L to facilitate changing brief, performing hygiene, and donning clean pants. Min A for rolling R/L. Supine>sit EOB with supervision using bed rails. Sitting balance EOB with supervision as pt bathed UB and donned clean shirt. SB transfer to w/c with supervision after w/c setup. Pt propelled w/c to gym and prepared for SB transfer to EOM. Pt educated on w/c mgmt, ie legs rest and arm rest. SB transfer to EOM with CGA. Focus on sitting balance with anterior and lateral leans. Lateral leans with supervision. Anterior/posterior leans with min/mod A. Pt fearful with anterior leans. SB transfer to w/c with continued education on w/c mgmt. Pt returned to room and tranferred back to bed at pt's request. Pt remained in bed with all needs within reach and bed alarm activated.   Therapy Documentation Precautions:  Precautions Precautions: Fall Precaution Comments: foley Restrictions Weight Bearing Restrictions: No Pain: Pain Assessment Pain Scale: 0-10 Pain Score: 0-No pain   Therapy/Group: Individual Therapy  [name] 01/22/2022, 2:30 PM

## 2022-01-23 LAB — CBC
HCT: 30.3 % — ABNORMAL LOW (ref 39.0–52.0)
Hemoglobin: 9.7 g/dL — ABNORMAL LOW (ref 13.0–17.0)
MCH: 33.1 pg (ref 26.0–34.0)
MCHC: 32 g/dL (ref 30.0–36.0)
MCV: 103.4 fL — ABNORMAL HIGH (ref 80.0–100.0)
Platelets: 253 10*3/uL (ref 150–400)
RBC: 2.93 MIL/uL — ABNORMAL LOW (ref 4.22–5.81)
RDW: 18.1 % — ABNORMAL HIGH (ref 11.5–15.5)
WBC: 4.5 10*3/uL (ref 4.0–10.5)
nRBC: 0 % (ref 0.0–0.2)

## 2022-01-23 NOTE — Progress Notes (Signed)
Occupational Therapy Session Note  Patient Details  Name: Allen Arias MRN: [id number] Date of Birth: 01/12/1941  Today's Date: 01/23/2022 OT Individual Time: 1100-1200 OT Individual Time Calculation (min): 60 min    Short Term Goals: Week 4:  OT Short Term Goal 1 (Week 4): STG=LTG 2/2 ELOS  Skilled Therapeutic Interventions/Progress Updates:    Subjective: Pt states he would like to learn how to take care of himself like dressing and bathing. Interpreter present virtually to assist with communication throughout session.    Objective:  Pt semi reclined in bed. Supine to sit with supervision.  Sliding board transfer including management of sliding board itself completed with supervision.  Pt self propelled to sink and completed face and oral hygiene and grooming with mod I.  Pt educated on use of reacher to pick up items dropped on floor and pt return demonstrated with independence.  Then educated pt on use of lateral leans and reacher to doff/donn pants.  Pt able to doff pants with step by step verbals cues and increased time (overall supervision).  Pt then donned pants using reacher and lateral leans with mod assist; pt able to thread BLE using reacher without assist, however having difficulty leaning laterally enough to pull pants over hips therefore needing assist.  Blocked practice lateral/anterior leans with BUE placement to "hike" hip completed and pt showed gradual improvement with practice.  Call bell in reach, seat alarm on.     Assessment:  Pt eager to learn today and motivated to increase his independence. Pt quickly learned and demonstrated proficiency of using reacher during self care.     Plan: Pt would benefit from further training on use of reacher and sock aide during self care as well as continued blocked practicing lateral leaning for LB self care tasks.   Therapy Documentation Precautions:  Precautions Precautions: Fall Precaution Comments: foley Restrictions Weight Bearing  Restrictions: No    Therapy/Group: Individual Therapy  Amie Critchley 01/23/2022, 1:43 PM

## 2022-01-23 NOTE — Progress Notes (Signed)
Pt went outside with son until 1855, pt still in chair wanting to brush teeth not ready to get into bed, unable to preform bowl program on this shift. Will let oncoming nurse be made aware of need to begin and preform bowl program on evening shift. Pt up at sink currently brushing dentures.   Dayna Ramus

## 2022-01-23 NOTE — Progress Notes (Signed)
Physical Therapy Weekly Progress Note  Patient Details  Name: Jaelen Soth MRN: [id number] Date of Birth: [birth year]  Beginning of progress report period: Jan 15, 2022 End of progress report period: Jan 23, 2022  Today's Date: 01/23/2022  Patient has met 3 of 3 short term goals.  Pt has made steady progress towards goals. Pt has shown improvement in sitting balance as well as improvements in anterior leaning. Pt is now able to perform bed mobility with supervision and increased time. Pt has also shown improvement in Slide board transfers and with increased time is able to set up Slide board with supervision. Education has been ongoing to pt in [place] for Slide board transfers to allow pt to provide caregiver education in preparation for transition to [place].   Patient continues to demonstrate the following deficits muscle weakness, muscle joint tightness, and muscle paralysis and impaired timing and sequencing, abnormal tone, unbalanced muscle activation, and decreased coordination and therefore will continue to benefit from skilled PT intervention to increase functional independence with mobility.  Patient progressing toward long term goals..  Continue plan of care.  PT Short Term Goals Week 3:  PT Short Term Goal 1 (Week 3): Will be able to perform bed/chair transfer CGA with good demonstration of head/hips relationship PT Short Term Goal 1 - Progress (Week 3): Met PT Short Term Goal 2 (Week 3): Will be able to maintain dynamic sitting balance with CGA consistently PT Short Term Goal 2 - Progress (Week 3): Met PT Short Term Goal 3 (Week 3): Will be able to manage w/c parts to set chair up for transfer with Supervision and cueing PT Short Term Goal 3 - Progress (Week 3): Met Week 4:  PT Short Term Goal 1 (Week 4): =LTG due to ELOS     Therapy Documentation Precautions:  Precautions Precautions: Fall Precaution Comments: foley Restrictions Weight Bearing Restrictions:  No    Therapy/Group: Individual Therapy  Rosita DeChalus Excell Seltzer, PT, DPT, CSRS  01/23/2022, 9:23 AM

## 2022-01-23 NOTE — Progress Notes (Signed)
PROGRESS NOTE   Subjective/Complaints: Pt reports his family got a call about a urology visit early next week. Advised this will need to be rescheduled.  No other concerns.  ROS: Limited by language.  Use of interpreter services patient was able to state no CP, SOB. , continued urinary incontinence.  No fever, chills. HA continues to be improved   Objective:   No results found.  No results for input(s): WBC, HGB, HCT, PLT in the last 72 hours.   No results for input(s): NA, K, CL, CO2, GLUCOSE, BUN, CREATININE, CALCIUM in the last 72 hours.    Intake/Output Summary (Last 24 hours) at 01/23/2022 [id number] Last data filed at 01/22/2022 1300 Gross per 24 hour  Intake 280 ml  Output --  Net 280 ml         Physical Exam:  Gen: no distress, normal appearing, in bed HEENT: oral mucosa pink and moist, NCAT Cardio: RRR, no MRG Chest: CTAB, normal effort Abd: soft, non-distended, + BS Ext: no edema Psych: pleasant, normal affect  GU: Incontinence of urine, wearing brief. Neurological: Alert, follows commands Musc: No edema. 1/5 in RLE, 2-/5 in LLE Extremities: pitting LLE edema 1 + to L knee-trace on RLE Skin: Bilateral groin incision, flat, non-tender, non-swollen, skin glue,  RLE incision: flat, non-tender, non-swollen, skin glue Psychiatric appropriate-flattened affect, pleasant  Vital Signs Blood pressure (!) 141/76, pulse 72, temperature (!) 97.5 F (36.4 C), resp. rate 17, height 5\' 4"  (1.626 m), weight 48.4 kg, SpO2 99 %.    Assessment/Plan: 1. Functional deficits which require 3+ hours per day of interdisciplinary therapy in a comprehensive inpatient rehab setting. Physiatrist is providing close team supervision and 24 hour management of active medical problems listed below. Physiatrist and rehab team continue to assess barriers to discharge/monitor patient progress toward functional and medical goals  Care  Tool:  Bathing    Body parts bathed by patient: Right arm, Left arm, Chest, Abdomen, Front perineal area, Right upper leg, Left upper leg, Face   Body parts bathed by helper: Buttocks, Right lower leg, Left lower leg Body parts n/a: Front perineal area   Bathing assist Assist Level: Moderate Assistance - Patient 50 - 74%     Upper Body Dressing/Undressing Upper body dressing   What is the patient wearing?: Pull over shirt    Upper body assist Assist Level: Supervision/Verbal cueing    Lower Body Dressing/Undressing Lower body dressing      What is the patient wearing?: Pants, Incontinence brief     Lower body assist Assist for lower body dressing: Maximal Assistance - Patient 25 - 49%     Toileting Toileting    Toileting assist Assist for toileting: Dependent - Patient 0%     Transfers Chair/bed transfer  Transfers assist     Chair/bed transfer assist level: Contact Guard/Touching assist     Locomotion Ambulation   Ambulation assist   Ambulation activity did not occur: Safety/medical concerns          Walk 10 feet activity   Assist  Walk 10 feet activity did not occur: Safety/medical concerns        Walk 50 feet activity  Assist Walk 50 feet with 2 turns activity did not occur: Safety/medical concerns         Walk 150 feet activity   Assist Walk 150 feet activity did not occur: Safety/medical concerns         Walk 10 feet on uneven surface  activity   Assist Walk 10 feet on uneven surfaces activity did not occur: Safety/medical concerns         Wheelchair     Assist Is the patient using a wheelchair?: Yes Type of Wheelchair: Manual    Wheelchair assist level: Supervision/Verbal cueing Max wheelchair distance: 150'    Wheelchair 50 feet with 2 turns activity    Assist        Assist Level: Supervision/Verbal cueing   Wheelchair 150 feet activity     Assist      Assist Level: Supervision/Verbal  cueing   Blood pressure (!) 141/76, pulse 72, temperature (!) 97.5 F (36.4 C), resp. rate 17, height 5\' 4"  (1.626 m), weight 48.4 kg, SpO2 99 %.  Medical Problem List and Plan: 1. 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             - Patient may shower but incisions must be covered.              -ELOS/Goals: Min 10 to 14 days. Expected discharge around 5/25,  Mod A to mod I -Discharged planned to [facility] place on 5/22 -Continue CIR OT/PT  2.  Antithrombotics: -DVT/anticoagulation: Mechanical: Sequential compression devices, below knee bilateral lower extremities - Check Dopplers in a.m. -01/01/2022 cannot be on Lovenox or Eliquis due to having low platelets with a risk of bleeding.  However if his platelets get above 100 K may consider starting.   -4/27 Vas [place]: Negative for DVT bilateral -4/29 Continue SCD's, while trending Plts -4/29 Plts today 132, up from 73 on 4/27, continue to trend to    ensure stays above 100K -4/30 Plts at 154 today, continue to trend with am labs 5/1- plts 206- will start Lovenox since high risk for DVT and recheck labs Thursday.  5/5 plts 220- continue to monitor 5/6 no new labs but will trend platelets with the q. Monday labs. 5/9- plts 187k- con't lovenox 5/11- will need lovenox for a total of 3 months from [facility]- so ~ 03/26/22     Latest Ref Rng & Units 01/20/2022    5:20 AM 01/19/2022    5:14 AM 01/12/2022    5:31 AM  CBC  WBC 4.0 - 10.5 K/uL 6.9   5.6   2.4    Hemoglobin 13.0 - 17.0 g/dL 7.6   7.7   9.3    Hematocrit 39.0 - 52.0 % 24.5   24.4   28.6    Platelets 150 - 400 K/uL 216   213   187       -antiplatelet therapy:N/A 3. Pain Management/Post-Op: Start Tylenol 650 mg 3 times daily.  Oxycodone as needed. 4. Mood: Team to provide a good support.  May need antidepressant-family and patient some disappointed and had multiple questions anxiety with regards to recovery and interventions potentially seeking second opinion etc.   Discussed with Dr. Willaim Sheng specially on spinal cord pathology. -01/01/2022 we will start Celexa 20 mg daily for mood. -4/29 Mood appropriate today. 5/10- more sad today, but said he's "tolerable".  5/14 Okay spirits today. - Antipsychotic agents: Not applicable 5. Neuropsych: This patient may be capable of making decisions on his  own behalf. 6. Skin/Wound Care: Routine pressure relief measures.              --Gerhadt's cream to scrotal breakdown            -4/30-Continue use of cream as protective barrier. 5/10- is healing per staff- con't regimen  5/14 improved per nursing, continue barrier cream. 5/16 Discussed foley may help keep skin dry and prevent  breakdown 5/17 Discussed further how urinary incontinence can lead to skin breakdown 7. Fluids/Electrolytes/Nutrition: Routine in and outs with follow-up chemistries -4/29 continue to check Cmet 8.  History of BPH/urinary retention.  Continue to hold Hytrin and Flomax for now. - Monitor BP for any orthostatic changes/drops. -4/30 Pending orthostatic vitals.  Pt voiding adequately s/p Foley removal. 9.  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 5/9- Plts 187k- doing well- cont' lovneox 5/11- use lovenox until 03/26/22 to prevent DVT then can stop 5/13 Continue plan above. 5/16 plts 216, continue lovenox 5/19 plts 253, continue current treatment 10.  ABLA: Monitor H/H for stability. - Improved from 5.7-10.  Recheck CBC in AM. -01/01/22 hemoglobin 9.1. -01/03/22 today hemoglobin has dropped to 7.0 there are no obvious signs of bleeding.  Patient does not report seeing visible blood in stool or urine.  New orders for stool guaiac x3 UA to rule out blood in  urine repeat H&H and orthostatic blood pressure to monitor for symptoms. -4/29 U/A negative for blood in urine. -4/30 Hgb up to 7.4 today from 7.0 yesterday, CBC in am to trend. Pending stool guaiac.  5/1- Hb 8.1 so don't know why it dropped so much- con't to monitor 5/4- HGB 8.4 today, appears to be stable, continue to monitor  5/5 hgb stable at 8.5, continue to monitor  5/6 we will continue to trend hemoglobin on q. Monday labs 5/9- Hb up to 9.3- cont' to monitor 5/14 Continue to trend with qMonday labs, daughter asked about labs.  Let her know that he gets weekly labs. 5/17 HGB decrease noted on 5/15, repeat on 5/16 stable, no signs of bleeding, check stool guaiac continue to monitor 5/19 HGB 9.7 today, improved continue to follow    Latest Ref Rng & Units 01/20/2022    5:20 AM 01/19/2022    5:14 AM 01/12/2022    5:31 AM  CBC  WBC 4.0 - 10.5 K/uL 6.9   5.6   2.4    Hemoglobin 13.0 - 17.0 g/dL 7.6   7.7   9.3    Hematocrit 39.0 - 52.0 % 24.5   24.4   28.6    Platelets 150 - 400 K/uL 216   213   187      11.  Insomnia: Slept well last night with current trazodone.  Continue as needed.  -4/30 Pt reports that trazodone ineffective for sleep.  Trial of 3 mg melatonin to if this works better for the patient. 5/3- will change to Ambien 5 mg QHS- due ot age, cannot do a higher dose.              5/4 insomnia improved last night  5/5 insomnia continues to be improved, follow            5/6 patient says slept well with current medication of Ambien 5 mg nightly continue this plan. 5/10- sleeping well with Ambien 5/13 No issues with sleep. 5/18 reports good sleep 12.  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 - Check KUB for constipation/stool burden burden. - 01/01/22-start bowel program q. nightly with digital stimulation with suppository.  Educated patient and son on bowel program. 4/28-has not been eating but has had small bowel movement  with the bowel program.   -4/29 has had stool today -4/30 No constipation reported, LBM 4/30 5/1- not clear if had BM/bowel program last night- have called nursing to figure out what's going on with bowel program             5/2- BM last night with program- will move to q2 days since pt says at home was q2-4 days would have BM- 5/6 patient reports BM 2 days ago which is not uncommon for his normal.  Continue bowel program as prescribed.  5/10- good results with bowel program QOD 5/13 Continue bowel program, reports BM 2 days ago. 5/17 last BM yesterday 13.  Neurogenic bladder: Had Foley due to urinary retention and low blood pressure -01/01/2022 plans to remove Foley in the morning on 428 and start Flomax 0.4 mg q. nightly because he was having low blood pressure up until now-we will monitor and stop if BP goes low again. -01/02/2022 Foley was removed around lunchtime. -01/03/2022 patient has voided twice since Foley removal however because patient is incontinent these were recorded as an occurrence instead of a volume.  Patient family and nurse said that he has been able to void without issue since Foley has been removed. -4/30 Patient continues to void but is incontinent r/t neurogenic bladder    5/1- think voiding is overflow- need bladder scans to be clear.     5/2- change bladder scans to q4 hours- and see if can void with urinal?    5/3- voiding incontinently- worried will have more skin breakdown- will stop 5/6 continue to monitor voiding with of every 4 hour bladder scans patient is not reporting issues with voiding today however continue to monitor for possible overflow. /9- voiding but incontinent- I think it's overflow- but not retaining much- less than 300cc when scanned. 5/11- d/w colleague in [place]- pt doesn't want foley/"tube" because will stop him from regaining bladder function, even though discussed don't know if/when that would occur.  Thought about flomax to make sure empties  better; vs Myrbetriq, but no bladder scans or in/out at nursing homes. D/w colleague more before making a decision- will send ot Urology outpatient after d/c.  5/12- discussed with pt- Foley- because of constant incontinence- concern for skin breakdown.  5/13 Tried to reach family but only came on-site for brief period, with attempt to reach out to family again.  Patient says "he will think about it" wrt Foley. 5/14 Daughter present at bedside but has some additional questions while considering Foley, specific to neurogenic bladder.  Requesting doctor rounding on Monday to reach out to her. 5/16 Discussed benefits and risks of foley, initially pt declines but later says he will  consider this 5/17 Continue to discuss foley, pt says he will decide by tomorrow 5/18 Pt declines foley, discussed possible skin and GU risks 5/19 20 minute discussion regarding neurologic bladder treatment, DSD risks, skin breakdown. Pt  declines foley and not interested in regular IC 14.  Azotemia - 01/01/2022 BUN 38 was 21-we will give IVF's-50 cc/h and recheck in a.m. if not better tomorrow, will not remove Foley. 01/02/2022 BUN down to 33-we will continue IVF's for the rest of the day today and remove Foley this afternoon patient advised to drink more and recheck labs Monday morning 01/05/2022 -4/29 BUN trending down at 33 (4/28) from 38 on 4/27 -4/30 qMonday labs to trend 5/1- BUN down to 24- con't to push fluids 5/4 BUN down to 23, continue to monitor 5/6 BUN at 24 creatinine at 0.81 encourage fluids adequate hydration. 5/7 trend with qMonday labs. 5/9- BUN 25- slightly dry but doesn't want IVFs- will push PO 5/16 BUN 29, encourage PO intake     Latest Ref Rng & Units 01/20/2022    5:20 AM 01/19/2022    5:14 AM 01/12/2022    5:31 AM  BMP  Glucose 70 - 99 mg/dL 86   88   96    BUN 8 - 23 mg/dL 29   27   25     Creatinine 0.61 - 1.24 mg/dL 0.78   0.78   0.74    Sodium 135 - 145 mmol/L 137   137   133    Potassium 3.5  - 5.1 mmol/L 3.9   3.9   4.1    Chloride 98 - 111 mmol/L 107   106   103    CO2 22 - 32 mmol/L 26   24   27     Calcium 8.9 - 10.3 mg/dL 8.5   8.5   8.6      15.  Cholelithiasis-  -4/28 no acute cholecystitis 16. LE edema- nonpitting             5/3- will reorder TEDs- thigh highs. For during day.              5/4- OT to provide teds today             5/6 continue to implement TEDs with help from OT             5/7 Worked with PT today, continue use of TED hose. 17.UTI             -5/5 Keflex was started for treatment             -Follow culture results           5/6 continue Keflex culture results are still pending we will follow up once these are posted. 5/7 Urine culture back, >10,000 E. Coli, pan sensitive.  Since Keflex good for E. Coli will continue current treatment.             5/9- PO ABX for 7 days due to complicated UTI             5/12- finished Po ABX 18. Headaches 5/14 Patient reports today that he has had nighttime headaches, several nights while here in Rehab.   -He has had no unusual spikes in b/p, denies any other symptoms occurring with his headaches. -He has been given Tylenol, but this hasn't worked well.  Requested Alleve, but did have one elevated Crt 1.30 (4/22).  NSAID's may be safe, 3 weeks out, and Crt normalized.  But will start a trial of Fioricet q6h prn first. -start topamax 25mg  HS -5/18 HA improved today 20. Lower extremity weakness: discussed with Lovena Le and Gershon Mussel trying e-stim   LOS: 23 days  A FACE TO FACE EVALUATION WAS PERFORMED  Jennye Boroughs, MD 01/23/2022, 7:02 AM

## 2022-01-23 NOTE — Progress Notes (Signed)
Physical Therapy Session Note  Patient Details  Name: Allen Arias MRN: [id number] Date of Birth: 06-05-1941  Today's Date: 01/23/2022 PT Individual Time: 0920-1018 AND 1415-1550 PT Individual Time Calculation (min): 58 min 95 min  Short Term Goals: Week 3:  PT Short Term Goal 1 (Week 3): Will be able to perform bed/chair transfer CGA with good demonstration of head/hips relationship PT Short Term Goal 2 (Week 3): Will be able to maintain dynamic sitting balance with CGA consistently PT Short Term Goal 3 (Week 3): Will be able to manage w/c parts to set chair up for transfer with Supervision and cueing  Skilled Therapeutic Interventions/Progress Updates: Pt presented in w/c agreeable to therapy. Pt denies pain during session. Pt propelled w/c to ortho gym and performed Slide board transfer to [place]. Pt required increased assistance for set up but was able to transfer with supervision. Pt participated in NuStep L1 x 5 min using x 4 extremities for reciprocal activity and forced use of BLE. Pt then performed an additional x 3 min AA BLE only. On last minute pt kept in a shorter range and was able to push RLE with very minimal assistance. Pt required overall minA for Slide board set up and transfer back to w/c. Pt was able to demonstrate improved negotiation of w/c in smaller space this session and demonstrated fair safety with w/c management the obstacle negotiation when propelling in hallway. In room pt was able to set up Slide board with supervision and increased time and performed Slide board transfer to bed. Pt advised not to pull on bed rail and was able to bring legs onto bed with minA due positioning when pt transferred to bed. With PTA stabilizing foot pt was able to boost to [facility]. Pt left in bed at end of session with bed alarm on, call bell within reach and needs met.   Tx2: Pt presented in bed agreeable to therapy. Pt denies pain during session. Performed bed mobility with supervision and  increased time. PTA donned shoes total A and pt performed Slide board transfer with CGA overall including Slide board set up. Pt propelled to rehab gym >126f with supervision and was CGA for Slide board set up and transfer to mat. At mat pt participated in seated dynamic balance activities including use of rebounder with textured ball, rebounder with 1Kg weighted ball, and catch with lateral reaching outside BOS with textured ball. Pt demonstrating improved tolerance to lateral reaches but required multimodal cues to decrease posterior lean with activity. Pt also participated in forward reaching and placing horseshoes on basketball loop 2 x 8. PTA slowly increasing distance between mat and basketball net to promote forward reach. Pt then transferred to supine requiring minA as pt's shoes were sticking to mat and PTA performed hamstring stretch 30 sec x 3 on LLE. Pt was then able to use elbows to prop self into long sitting. Pt then performed reaching  activity reaching to shins as well as repositioning BLE into circle sit. Pt noted increased pain in L hip when legs brought into tighter "circle". Pt was able to transfer to sitting EOM and was close supervision for Slide board set up on mat and CGA to transfer to mat. Pt transported back to room total A for energy conservation. In room pt requesting to change shirt and pants. Pt doffed shirt and donned new shirt with set up. Pt then performed Slide board transfer with PTA reminding pt not to reach for bed rail but overall CGA to  bed. In bed PTA doffed pants but noted pt's brief wet due to urine incontinence. Pt also noted to have active BM while PTA performing peri-care. PTA completed peri-care and threaded pants total A. Pt was able to roll L/R with supervision. Pt left in bed at end of session with bed alarm on, call bell within reach and needs met.      Therapy Documentation Precautions:  Precautions Precautions: Fall Precaution Comments:  foley Restrictions Weight Bearing Restrictions: No General:   Vital Signs:  Pain:   Mobility:   Locomotion :    Trunk/Postural Assessment :    Balance:   Exercises:   Other Treatments:      Therapy/Group: Individual Therapy  Brian Zeitlin 01/23/2022, 12:17 PM

## 2022-01-23 NOTE — Progress Notes (Signed)
Patient ID: Allen Arias, male   DOB: [birth year], 81 y.o.   MRN: [id number]  SW followed up with pt dtr [name] to discuss preferred SNF. Reports she likes [facility] and has discussed with her father but waiting on his decision. She states they have an appt for their mom that is over at 2pm today, and a dance recital today at [place] updated medical team on details.   *SW received updates from [facility]missions with [facility] that bed offer is for Monday. SW informed will confirm.   *SW spoke with pt dtr to confirm [facility] on Monday (5/22). SW requested ipad translator to be available per dtr request. There will be appropriate contact information in room for translator services for staff to use. SW will confirm on Monday for room#. [facility] [phone])  to pick up at 10am.   SW updated medical team.   Loralee Pacas, MSW, [place] Office: [phone] Cell: [phone] Fax: [phone]

## 2022-01-24 LAB — OCCULT BLOOD X 1 CARD TO LAB, STOOL: Fecal Occult Bld: NEGATIVE

## 2022-01-24 LAB — BASIC METABOLIC PANEL
Anion gap: 7 (ref 5–15)
BUN: 32 mg/dL — ABNORMAL HIGH (ref 8–23)
CO2: 22 mmol/L (ref 22–32)
Calcium: 8.8 mg/dL — ABNORMAL LOW (ref 8.9–10.3)
Chloride: 108 mmol/L (ref 98–111)
Creatinine, Ser: 0.74 mg/dL (ref 0.61–1.24)
GFR, Estimated: >60 mL/min (ref >60)
Glucose, Bld: 84 mg/dL (ref 70–99)
Potassium: 3.5 mmol/L (ref 3.5–5.1)
Sodium: 137 mmol/L (ref 135–145)

## 2022-01-24 NOTE — Progress Notes (Signed)
Physical Therapy Session Note  Patient Details  Name: Allen Arias MRN: [id number] Date of Birth: [birth year]  Today's Date: 01/24/2022 PT Individual Time: 0918-1018 PT Individual Time Calculation (min): 60 min   Short Term Goals: Week 3:  PT Short Term Goal 1 (Week 3): Will be able to perform bed/chair transfer CGA with good demonstration of head/hips relationship PT Short Term Goal 2 (Week 3): Will be able to maintain dynamic sitting balance with CGA consistently PT Short Term Goal 3 (Week 3): Will be able to manage w/c parts to set chair up for transfer with Supervision and cueing  Skilled Therapeutic Interventions/Progress Updates: Pt presented in bed agreeable to therapy. Per [employer] interpreter [phone] pt requesting OOB and to complete ADL tasks this session. Prior to OOB pt noted to be incontinent of bladder. Pt performed rolling L/R with supervision to allow PTA to doff brief and perform peri-care. Once completed PTA threaded new pants and pt was able to perform rolling L/R to pull pants over hips. Performed supine to sit at EOB with supervision and use of bed features. PTA donned TED hose and shoes total A. Pt performed Slide board transfer to w/c with minA due to pants getting caught on corner of Slide board. Pt transferred over to sink and pt washed hair, cleaned face, performed oral hygiene, and shaved with set up assist. Pt then transported to day room for time management and participated in Cybex Kinetron 90cm/sec 10 cycles x 2. Pt was able to push down with RLE when PTA took weight (foot) off L plate. Pt then propelled >241f to 4W/MW hall and PTA transported remaining distance. Pt left in w/c at end of session and left with belt alarm on, call bell within reach and needs met.      Therapy Documentation Precautions:  Precautions Precautions: Fall Precaution Comments: foley Restrictions Weight Bearing Restrictions: No General:   Vital Signs:  Pain:   Mobility:   Locomotion  :    Trunk/Postural Assessment :    Balance:   Exercises:   Other Treatments:      Therapy/Group: Individual Therapy  Brigitte Soderberg 01/24/2022, 10:55 AM

## 2022-01-24 NOTE — Plan of Care (Signed)
  Problem: Consults ?Goal: RH STROKE PATIENT EDUCATION ?Description: See Patient Education module for education specifics  ?Outcome: Progressing ?  ?Problem: RH BOWEL ELIMINATION ?Goal: RH STG MANAGE BOWEL WITH ASSISTANCE ?Description: STG Manage Bowel with Min Assistance. ?Outcome: Progressing ?Goal: RH STG MANAGE BOWEL W/MEDICATION W/ASSISTANCE ?Description: STG Manage Bowel with Medication with Min Assistance. ?Outcome: Progressing ?  ?Problem: RH BLADDER ELIMINATION ?Goal: RH STG MANAGE BLADDER WITH ASSISTANCE ?Description: STG Manage Bladder With Min Assistance ?Outcome: Progressing ?Goal: RH STG MANAGE BLADDER WITH MEDICATION WITH ASSISTANCE ?Description: STG Manage Bladder With Medication With Min Assistance. ?Outcome: Progressing ?  ?Problem: RH SKIN INTEGRITY ?Goal: RH STG MAINTAIN SKIN INTEGRITY WITH ASSISTANCE ?Description: STG Maintain Skin Integrity With Min Assistance. ?Outcome: Progressing ?Goal: RH STG ABLE TO PERFORM INCISION/WOUND CARE W/ASSISTANCE ?Description: STG Able To Perform Incision/Wound Care With Min Assistance. ?Outcome: Progressing ?  ?Problem: RH SAFETY ?Goal: RH STG ADHERE TO SAFETY PRECAUTIONS W/ASSISTANCE/DEVICE ?Description: STG Adhere to Safety Precautions With Cues and Reminders. ?Outcome: Progressing ?Goal: RH STG DECREASED RISK OF FALL WITH ASSISTANCE ?Description: STG Decreased Risk of Fall With Min Assistance. ?Outcome: Progressing ?  ?

## 2022-01-24 NOTE — Progress Notes (Signed)
PROGRESS NOTE   Subjective/Complaints:  Patient reports no overnight complaints, but reported a headache later this morning/early afternoon.  Says that he is willing to have Foley but only after he is transferred to SNF, as he does not want Foley while here.  He is agreeable to in-out caths as needed.  Daughter is also deliberating whether or not to have Foley cath for the patient since he is voiding some.  Reports some voiding but is also retaining urine.  May need Foley if unable to cath and volume >350 ml.   ROS: Limited by language.  Use of interpreter services patient was able to state no CP, SOB, continued urinary incontinence + urinary retention.  No fever, chills.+Headaches.   Objective:   No results found.  Recent Labs    01/23/22 0723  WBC 4.5  HGB 9.7*  HCT 30.3*  PLT 253    Recent Labs    01/24/22 0515  NA 137  K 3.5  CL 108  CO2 22  GLUCOSE 84  BUN 32*  CREATININE 0.74  CALCIUM 8.8*     Intake/Output Summary (Last 24 hours) at 01/24/2022 1229 Last data filed at 01/23/2022 1821 Gross per 24 hour  Intake 354 ml  Output --  Net 354 ml        Physical Exam:  Gen: no distress, normal appearing, in bed HEENT: oral mucosa pink and moist, NCAT Cardio: RRR, no MRG Chest: CTAB, normal effort Abd: soft, non-distended, + BS Ext: no edema Psych: pleasant, normal affect GU: Incontinence of urine, wearing brief.  Reports some voiding but is also retaining urine.  Bladder scan 358 ml and unable to in/out cath due to resistance.  Allowed to void, re-scan: Bladder scan 291 ml.  May need Foley if unable to cath and volume >350 ml. Neurological: Alert, follows commands, has headache pain Musc: No edema. 1/5 in RLE, 2-/5 in LLE Extremities: pitting LLE edema 1 + to L knee-trace on RLE Skin: Bilateral groin incision, flat, non-tender, non-swollen, skin glue,  RLE incision: flat, non-tender, non-swollen, skin  glue Psychiatric appropriate-flattened affect, pleasant  Vital Signs Blood pressure 130/72, pulse 70, temperature (!) 97.4 F (36.3 C), temperature source Oral, resp. rate 18, height 5' 4"  (1.626 m), weight 49 kg, SpO2 99 %.    Assessment/Plan: 1. Functional deficits which require 3+ hours per day of interdisciplinary therapy in a comprehensive inpatient rehab setting. Physiatrist is providing close team supervision and 24 hour management of active medical problems listed below. Physiatrist and rehab team continue to assess barriers to discharge/monitor patient progress toward functional and medical goals  Care Tool:  Bathing    Body parts bathed by patient: Right arm, Left arm, Chest, Abdomen, Front perineal area, Right upper leg, Left upper leg, Face   Body parts bathed by helper: Buttocks, Right lower leg, Left lower leg Body parts n/a: Front perineal area   Bathing assist Assist Level: Moderate Assistance - Patient 50 - 74%     Upper Body Dressing/Undressing Upper body dressing   What is the patient wearing?: Pull over shirt    Upper body assist Assist Level: Set up assist    Lower Body Dressing/Undressing  Lower body dressing      What is the patient wearing?: Pants, Incontinence brief     Lower body assist Assist for lower body dressing: Maximal Assistance - Patient 25 - 49%     Toileting Toileting    Toileting assist Assist for toileting: Dependent - Patient 0%     Transfers Chair/bed transfer  Transfers assist     Chair/bed transfer assist level: Minimal Assistance - Patient > 75%     Locomotion Ambulation   Ambulation assist   Ambulation activity did not occur: Safety/medical concerns          Walk 10 feet activity   Assist  Walk 10 feet activity did not occur: Safety/medical concerns        Walk 50 feet activity   Assist Walk 50 feet with 2 turns activity did not occur: Safety/medical concerns         Walk 150 feet  activity   Assist Walk 150 feet activity did not occur: Safety/medical concerns         Walk 10 feet on uneven surface  activity   Assist Walk 10 feet on uneven surfaces activity did not occur: Safety/medical concerns         Wheelchair     Assist Is the patient using a wheelchair?: Yes Type of Wheelchair: Manual    Wheelchair assist level: Supervision/Verbal cueing Max wheelchair distance: 150'    Wheelchair 50 feet with 2 turns activity    Assist        Assist Level: Supervision/Verbal cueing   Wheelchair 150 feet activity     Assist      Assist Level: Supervision/Verbal cueing   Blood pressure 130/72, pulse 70, temperature (!) 97.4 F (36.3 C), temperature source Oral, resp. rate 18, height 5' 4"  (1.626 m), weight 49 kg, SpO2 99 %.  Medical Problem List and Plan: 1. 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             - Patient may shower but incisions must be covered.              -ELOS/Goals: Min 10 to 14 days. Expected discharge around 5/25,  Mod A to mod I -Discharged planned to [facility] place on 5/22 -Continue CIR OT/PT  2.  Antithrombotics: -DVT/anticoagulation: Mechanical: Sequential compression devices, below knee bilateral lower extremities - Check Dopplers in a.m. -01/01/2022 cannot be on Lovenox or Eliquis due to having low platelets with a risk of bleeding.  However if his platelets get above 100 K may consider starting.   -4/27 Vas [place]: Negative for DVT bilateral -4/29 Continue SCD's, while trending Plts -4/29 Plts today 132, up from 73 on 4/27, continue to trend to    ensure stays above 100K -4/30 Plts at 154 today, continue to trend with am labs 5/1- plts 206- will start Lovenox since high risk for DVT and recheck labs Thursday.  5/5 plts 220- continue to monitor 5/6 no new labs but will trend platelets with the q. Monday labs. 5/9- plts 187k- con't lovenox 5/11- will need lovenox for a total of 3  months from [facility]- so ~ 03/26/22 5/20 Plts at 253k (5/19)     Latest Ref Rng & Units 01/23/2022    7:23 AM 01/20/2022    5:20 AM 01/19/2022    5:14 AM  CBC  WBC 4.0 - 10.5 K/uL 4.5   6.9   5.6    Hemoglobin 13.0 - 17.0 g/dL 9.7  7.6   7.7    Hematocrit 39.0 - 52.0 % 30.3   24.5   24.4    Platelets 150 - 400 K/uL 253   216   213       -antiplatelet therapy:N/A 3. Pain Management/Post-Op: Start Tylenol 650 mg 3 times daily.  Oxycodone as needed. 4. Mood: Team to provide a good support.  May need antidepressant-family and patient some disappointed and had multiple questions anxiety with regards to recovery and interventions potentially seeking second opinion etc.  Discussed with Dr. Willaim Sheng specially on spinal cord pathology. -01/01/2022 we will start Celexa 20 mg daily for mood. -4/29 Mood appropriate today. 5/10- more sad today, but said he's "tolerable".  5/14 Okay spirits today. - Antipsychotic agents: Not applicable 5. Neuropsych: This patient may be capable of making decisions on his own behalf. 6. Skin/Wound Care: Routine pressure relief measures.              --Gerhadt's cream to scrotal breakdown            -4/30-Continue use of cream as protective barrier. 5/10- is healing per staff- con't regimen  5/14 improved per nursing, continue barrier cream. 5/16 Discussed foley may help keep skin dry and prevent  breakdown 5/17 Discussed further how urinary incontinence can lead to skin breakdown 7. Fluids/Electrolytes/Nutrition: Routine in and outs with follow-up chemistries -4/29 continue to check Cmet 8.  History of BPH/urinary retention.  Continue to hold Hytrin and Flomax for now. - Monitor BP for any orthostatic changes/drops. -4/30 Pending orthostatic vitals.  Pt voiding adequately s/p Foley removal. 9.  Thrombocytopenia:?  Chronic monitor for signs of bleeding      -Has varied from 100 down to 44 down to 56 -4/29 Plts today 132, up from 73 on 4/27, continue to trend to    ensure  stays above 100K -4/30 Plts at 154 today, continue to trend with am labs 5/1- Plts 206- start Lovenox 5/6 patient to continue Lovenox no bleeding issues. 5/9- Plts 187k- doing well- cont' lovneox 5/11- use lovenox until 03/26/22 to prevent DVT then can stop 5/13 Continue plan above. 5/16 plts 216, continue lovenox 5/19 plts 253, continue current treatment 10.  ABLA: Monitor H/H for stability. - Improved from 5.7-10.  Recheck CBC in AM. -01/01/22 hemoglobin 9.1. -01/03/22 today hemoglobin has dropped to 7.0 there are no obvious signs of bleeding.  Patient does not report seeing visible blood in stool or urine.  New orders for stool guaiac x3 UA to rule out blood in urine repeat H&H and orthostatic blood pressure to monitor for symptoms. -4/29 U/A negative for blood in urine. -4/30 Hgb up to 7.4 today from 7.0 yesterday, CBC in am to trend. Pending stool guaiac.  5/1- Hb 8.1 so don't know why it dropped so much- con't to monitor 5/4- HGB 8.4 today, appears to be stable, continue to monitor  5/5 hgb stable at 8.5, continue to monitor  5/6 we will continue to trend hemoglobin on q. Monday labs 5/9- Hb up to 9.3- cont' to monitor 5/14 Continue to trend with qMonday labs, daughter asked about labs.  Let her know that he gets weekly labs. 5/17 HGB decrease noted on 5/15, repeat on 5/16 stable, no signs of bleeding, check stool guaiac continue to monitor 5/19 HGB 9.7 today, improved continue to follow    Latest Ref Rng & Units 01/23/2022    7:23 AM 01/20/2022    5:20 AM 01/19/2022    5:14 AM  CBC  WBC 4.0 - 10.5 K/uL 4.5   6.9   5.6    Hemoglobin 13.0 - 17.0 g/dL 9.7   7.6   7.7    Hematocrit 39.0 - 52.0 % 30.3   24.5   24.4    Platelets 150 - 400 K/uL 253   216   213      11.  Insomnia: Slept well last night with current trazodone.  Continue as needed.  -4/30 Pt reports that trazodone ineffective for sleep.  Trial of 3 mg melatonin to if this works better for the patient. 5/3- will change to  Ambien 5 mg QHS- due ot age, cannot do a higher dose.              5/4 insomnia improved last night             5/5 insomnia continues to be improved, follow            5/6 patient says slept well with current medication of Ambien 5 mg nightly continue this plan. 5/10- sleeping well with Ambien 5/13 No issues with sleep. 5/18 reports good sleep 5/20 Sleeping okay, some interruptions when he has headaches. 12.  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 - Check KUB for constipation/stool burden burden. - 01/01/22-start bowel program q. nightly with digital stimulation with suppository.  Educated patient and son on bowel program. 4/28-has not been eating but has had small bowel movement with the bowel program.   -4/29 has had stool today -4/30 No constipation reported, LBM 4/30 5/1- not clear if had BM/bowel program last night- have called nursing to figure out what's going on with bowel program             5/2- BM last night with program- will move to q2 days since pt says at home was q2-4 days would have BM- 5/6 patient reports BM 2 days ago which is not uncommon for his normal.  Continue bowel program as prescribed.  5/10- good results with bowel program QOD 5/13 Continue bowel program, reports BM 2 days ago. 5/17 last BM yesterday 13.  Neurogenic bladder: Had Foley due to urinary retention and low blood pressure -01/01/2022 plans to remove Foley in the morning on 428 and start Flomax 0.4 mg q. nightly because he was having low blood pressure up until now-we will monitor and stop if BP goes low again. -01/02/2022 Foley was removed around lunchtime. -01/03/2022 patient has voided twice since Foley removal however because patient is incontinent these were recorded as an occurrence instead of a volume.  Patient family and nurse said that he has been able to void without issue since Foley has been removed. -4/30 Patient continues to void but is incontinent  r/t neurogenic bladder    5/1- think voiding is overflow- need bladder scans to be clear.     5/2- change bladder scans to q4 hours- and see if can void with urinal?    5/3- voiding incontinently- worried will have more skin breakdown- will stop 5/6 continue to monitor voiding with of every 4 hour bladder scans patient is not reporting issues with voiding today however continue to monitor for possible overflow. /9- voiding but incontinent- I think it's overflow- but not retaining much- less than 300cc when scanned. 5/11- d/w colleague in [place]- pt doesn't want foley/"tube" because will stop him from regaining bladder function, even though discussed don't know if/when that would occur.  Thought about flomax to make  sure empties better; vs Myrbetriq, but no bladder scans or in/out at nursing homes. D/w colleague more before making a decision- will send ot Urology outpatient after d/c.  5/12- discussed with pt- Foley- because of constant incontinence- concern for skin breakdown.  5/13 Tried to reach family but only came on-site for brief period, with attempt to reach out to family again.  Patient says "he will think about it" wrt Foley. 5/14 Daughter present at bedside but has some additional questions while considering Foley, specific to neurogenic bladder.  Requesting doctor rounding on Monday to reach out to her. 5/16 Discussed benefits and risks of foley, initially pt declines but later says he will  consider this 5/17 Continue to discuss foley, pt says he will decide by tomorrow 5/18 Pt declines foley, discussed possible skin and GU risks 5/19 20 minute discussion regarding neurologic bladder treatment, DSD risks, skin breakdown. Pt declines foley and not interested in regular IC 5/20 discussion with daughter and patient about benefits and risks of Foley cath.  They both are deliberating.  Patient has has some voiding but is not fully emptying bladder which can pose infection risk.  Patient and  daughter acknowledged understanding but still wish to wait. Nurse tried to straight cath at 1200 when bladder scan volume was 358 ml, but met resistance, later pt voided some, with post-void scan 291 ml. -May need Foley for volume >350 ml if I/O unsuccessful. 14.  Azotemia - 01/01/2022 BUN 38 was 21-we will give IVF's-50 cc/h and recheck in a.m. if not better tomorrow, will not remove Foley. 01/02/2022 BUN down to 33-we will continue IVF's for the rest of the day today and remove Foley this afternoon patient advised to drink more and recheck labs Monday morning 01/05/2022 -4/29 BUN trending down at 33 (4/28) from 38 on 4/27 -4/30 qMonday labs to trend 5/1- BUN down to 24- con't to push fluids 5/4 BUN down to 23, continue to monitor 5/6 BUN at 24 creatinine at 0.81 encourage fluids adequate hydration. 5/7 trend with qMonday labs. 5/9- BUN 25- slightly dry but doesn't want IVFs- will push PO 5/16 BUN 29, encourage PO intake 5/20 BUN 32, encourage PO intake with cues.     Latest Ref Rng & Units 01/24/2022    5:15 AM 01/20/2022    5:20 AM 01/19/2022    5:14 AM  BMP  Glucose 70 - 99 mg/dL 84   86   88    BUN 8 - 23 mg/dL 32   29   27    Creatinine 0.61 - 1.24 mg/dL 0.74   0.78   0.78    Sodium 135 - 145 mmol/L 137   137   137    Potassium 3.5 - 5.1 mmol/L 3.5   3.9   3.9    Chloride 98 - 111 mmol/L 108   107   106    CO2 22 - 32 mmol/L 22   26   24     Calcium 8.9 - 10.3 mg/dL 8.8   8.5   8.5      15.  Cholelithiasis-  -4/28 no acute cholecystitis 16. LE edema- nonpitting             5/3- will reorder TEDs- thigh highs. For during day.              5/4- OT to provide teds today             5/6 continue to implement TEDs with help from  OT             5/7 Worked with PT today, continue use of TED hose. 17.UTI             -5/5 Keflex was started for treatment             -Follow culture results           5/6 continue Keflex culture results are still pending we will follow up once these are  posted. 5/7 Urine culture back, >10,000 E. Coli, pan sensitive.  Since Keflex good for E. Coli will continue current treatment.             5/9- PO ABX for 7 days due to complicated UTI             5/12- finished Po ABX 18. Headaches 5/14 Patient reports today that he has had nighttime headaches, several nights while here in Rehab.   -He has had no unusual spikes in b/p, denies any other symptoms occurring with his headaches. -He has been given Tylenol, but this hasn't worked well.  Requested Alleve, but did have one elevated Crt 1.30 (4/22).  NSAID's may be safe, 3 weeks out, and Crt normalized.  But will start a trial of Fioricet q6h prn first. -start topamax 68m HS -5/18 HA improved today 5/20 reports some issues with headaches today, continue Topamax and Tylenol 20. Lower extremity weakness: discussed with TLovena Leand Tom trying e-stim   LOS: 24 days A FACE TO FACE EVALUATION WAS PERFORMED  LLuetta Nutting FNP 01/24/2022, 12:29 PM

## 2022-01-24 NOTE — Progress Notes (Signed)
Bowel program initiated at 2140 on 5/19, dig stim performed and suppository inserted. Small amount of blood from visible hemorrhoid noted. Medium size bowel movement followed

## 2022-01-25 NOTE — Progress Notes (Signed)
PROGRESS NOTE   Subjective/Complaints:  Patient reports no overnight complaints, no headache reported last night.  He is voiding some with bladder scans continued. Says that he is willing to have Foley but only after he is transferred to SNF, as he does not want Foley while here.  He is agreeable to in-out caths as needed.    ROS: Limited by language.  Use of interpreter services patient was able to state no CP, SOB, no headache today, continued urinary incontinence + urinary retention.  No fever, chills.   Objective:   No results found.  Recent Labs    01/23/22 0723  WBC 4.5  HGB 9.7*  HCT 30.3*  PLT 253    Recent Labs    01/24/22 0515  NA 137  K 3.5  CL 108  CO2 22  GLUCOSE 84  BUN 32*  CREATININE 0.74  CALCIUM 8.8*     Intake/Output Summary (Last 24 hours) at 01/25/2022 1118 Last data filed at 01/24/2022 1314 Gross per 24 hour  Intake 240 ml  Output --  Net 240 ml        Physical Exam:  Gen: no distress, normal appearing, in bed HEENT: oral mucosa pink and moist, NCAT Cardio: RRR, no MRG Chest: CTAB, normal effort Abd: soft, non-distended, + BS Ext: no edema Psych: pleasant, normal affect GU: Incontinence of urine, wearing brief.  Reports some voiding, but also has urinary retention: last bladder scan 194 ml this am.  Neurological: Alert, follows commands, denies headaches overnight or this am Musc: No edema. 1/5 in RLE, 2-/5 in LLE Extremities: pitting LLE edema 1 + to L knee-trace on RLE Skin: Bilateral groin incision, flat, non-tender, non-swollen, skin glue,  RLE incision: flat, non-tender, non-swollen, skin glue Psychiatric appropriate-flattened affect, pleasant  Vital Signs Blood pressure (!) 103/57, pulse 88, temperature 98.2 F (36.8 C), temperature source Oral, resp. rate 18, height 5' 4"  (1.626 m), weight 49 kg, SpO2 98 %.    Assessment/Plan: 1. Functional deficits which require 3+  hours per day of interdisciplinary therapy in a comprehensive inpatient rehab setting. Physiatrist is providing close team supervision and 24 hour management of active medical problems listed below. Physiatrist and rehab team continue to assess barriers to discharge/monitor patient progress toward functional and medical goals  Care Tool:  Bathing    Body parts bathed by patient: Right arm, Left arm, Chest, Abdomen, Front perineal area, Right upper leg, Left upper leg, Face   Body parts bathed by helper: Buttocks, Right lower leg, Left lower leg Body parts n/a: Front perineal area   Bathing assist Assist Level: Moderate Assistance - Patient 50 - 74%     Upper Body Dressing/Undressing Upper body dressing   What is the patient wearing?: Pull over shirt    Upper body assist Assist Level: Set up assist    Lower Body Dressing/Undressing Lower body dressing      What is the patient wearing?: Pants, Incontinence brief     Lower body assist Assist for lower body dressing: Maximal Assistance - Patient 25 - 49%     Toileting Toileting    Toileting assist Assist for toileting: Dependent - Patient 0%  Transfers Chair/bed transfer  Transfers assist     Chair/bed transfer assist level: Minimal Assistance - Patient > 75%     Locomotion Ambulation   Ambulation assist   Ambulation activity did not occur: Safety/medical concerns          Walk 10 feet activity   Assist  Walk 10 feet activity did not occur: Safety/medical concerns        Walk 50 feet activity   Assist Walk 50 feet with 2 turns activity did not occur: Safety/medical concerns         Walk 150 feet activity   Assist Walk 150 feet activity did not occur: Safety/medical concerns         Walk 10 feet on uneven surface  activity   Assist Walk 10 feet on uneven surfaces activity did not occur: Safety/medical concerns         Wheelchair     Assist Is the patient using a  wheelchair?: Yes Type of Wheelchair: Manual    Wheelchair assist level: Supervision/Verbal cueing Max wheelchair distance: 150'    Wheelchair 50 feet with 2 turns activity    Assist        Assist Level: Supervision/Verbal cueing   Wheelchair 150 feet activity     Assist      Assist Level: Supervision/Verbal cueing   Blood pressure (!) 103/57, pulse 88, temperature 98.2 F (36.8 C), temperature source Oral, resp. rate 18, height 5' 4"  (1.626 m), weight 49 kg, SpO2 98 %.  Medical Problem List and Plan: 1. 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             - Patient may shower but incisions must be covered.              -ELOS/Goals: Min 10 to 14 days. Expected discharge around 5/25,  Mod A to mod I -Discharged planned to [facility] place on 5/22 -Continue CIR OT/PT  2.  Antithrombotics: -DVT/anticoagulation: Mechanical: Sequential compression devices, below knee bilateral lower extremities - Check Dopplers in a.m. -01/01/2022 cannot be on Lovenox or Eliquis due to having low platelets with a risk of bleeding.  However if his platelets get above 100 K may consider starting.   -4/27 Vas [place]: Negative for DVT bilateral -4/29 Continue SCD's, while trending Plts -4/29 Plts today 132, up from 73 on 4/27, continue to trend to    ensure stays above 100K -4/30 Plts at 154 today, continue to trend with am labs 5/1- plts 206- will start Lovenox since high risk for DVT and recheck labs Thursday.  5/5 plts 220- continue to monitor 5/6 no new labs but will trend platelets with the q. Monday labs. 5/9- plts 187k- con't lovenox 5/11- will need lovenox for a total of 3 months from [facility]- so ~ 03/26/22 5/20 Plts at 253k (5/19)     Latest Ref Rng & Units 01/23/2022    7:23 AM 01/20/2022    5:20 AM 01/19/2022    5:14 AM  CBC  WBC 4.0 - 10.5 K/uL 4.5   6.9   5.6    Hemoglobin 13.0 - 17.0 g/dL 9.7   7.6   7.7    Hematocrit 39.0 - 52.0 % 30.3   24.5   24.4     Platelets 150 - 400 K/uL 253   216   213       -antiplatelet therapy:N/A 3. Pain Management/Post-Op: Start Tylenol 650 mg 3 times daily.  Oxycodone as needed. 4.  Mood: Team to provide a good support.  May need antidepressant-family and patient some disappointed and had multiple questions anxiety with regards to recovery and interventions potentially seeking second opinion etc.  Discussed with Dr. Willaim Sheng specially on spinal cord pathology. -01/01/2022 we will start Celexa 20 mg daily for mood. -4/29 Mood appropriate today. 5/10- more sad today, but said he's "tolerable".  5/14 Okay spirits today. - Antipsychotic agents: Not applicable 5. Neuropsych: This patient may be capable of making decisions on his own behalf. 6. Skin/Wound Care: Routine pressure relief measures.              --Gerhadt's cream to scrotal breakdown            -4/30-Continue use of cream as protective barrier. 5/10- is healing per staff- con't regimen  5/14 improved per nursing, continue barrier cream. 5/16 Discussed foley may help keep skin dry and prevent  breakdown 5/17 Discussed further how urinary incontinence can lead to skin breakdown 7. Fluids/Electrolytes/Nutrition: Routine in and outs with follow-up chemistries -4/29 continue to check Cmet 8.  History of BPH/urinary retention.  Continue to hold Hytrin and Flomax for now. - Monitor BP for any orthostatic changes/drops. -4/30 Pending orthostatic vitals.  Pt voiding adequately s/p Foley removal. 9.  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 5/9- Plts 187k- doing well- cont' lovneox 5/11- use lovenox until 03/26/22 to prevent DVT then can stop 5/13 Continue plan above. 5/16  plts 216, continue lovenox 5/19 plts 253, continue current treatment 10.  ABLA: Monitor H/H for stability. - Improved from 5.7-10.  Recheck CBC in AM. -01/01/22 hemoglobin 9.1. -01/03/22 today hemoglobin has dropped to 7.0 there are no obvious signs of bleeding.  Patient does not report seeing visible blood in stool or urine.  New orders for stool guaiac x3 UA to rule out blood in urine repeat H&H and orthostatic blood pressure to monitor for symptoms. -4/29 U/A negative for blood in urine. -4/30 Hgb up to 7.4 today from 7.0 yesterday, CBC in am to trend. Pending stool guaiac.  5/1- Hb 8.1 so don't know why it dropped so much- con't to monitor 5/4- HGB 8.4 today, appears to be stable, continue to monitor  5/5 hgb stable at 8.5, continue to monitor  5/6 we will continue to trend hemoglobin on q. Monday labs 5/9- Hb up to 9.3- cont' to monitor 5/14 Continue to trend with qMonday labs, daughter asked about labs.  Let her know that he gets weekly labs. 5/17 HGB decrease noted on 5/15, repeat on 5/16 stable, no signs of bleeding, check stool guaiac continue to monitor 5/19 HGB 9.7 today, improved continue to follow 5/21 Follow qMonday labs    Latest Ref Rng & Units 01/23/2022    7:23 AM 01/20/2022    5:20 AM 01/19/2022    5:14 AM  CBC  WBC 4.0 - 10.5 K/uL 4.5   6.9   5.6    Hemoglobin 13.0 - 17.0 g/dL 9.7   7.6   7.7    Hematocrit 39.0 - 52.0 % 30.3   24.5   24.4    Platelets 150 - 400 K/uL 253  216   213      11.  Insomnia: Slept well last night with current trazodone.  Continue as needed.  -4/30 Pt reports that trazodone ineffective for sleep.  Trial of 3 mg melatonin to if this works better for the patient. 5/3- will change to Ambien 5 mg QHS- due ot age, cannot do a higher dose.              5/4 insomnia improved last night             5/5 insomnia continues to be improved, follow            5/6 patient says slept well with current medication of Ambien 5 mg nightly continue this  plan. 5/10- sleeping well with Ambien 5/13 No issues with sleep. 5/18 reports good sleep 5/20 Sleeping okay, some interruptions when he has headaches. 5/21 slept well last night. 12.  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 - Check KUB for constipation/stool burden burden. - 01/01/22-start bowel program q. nightly with digital stimulation with suppository.  Educated patient and son on bowel program. 4/28-has not been eating but has had small bowel movement with the bowel program.   -4/29 has had stool today -4/30 No constipation reported, LBM 4/30 5/1- not clear if had BM/bowel program last night- have called nursing to figure out what's going on with bowel program             5/2- BM last night with program- will move to q2 days since pt says at home was q2-4 days would have BM- 5/6 patient reports BM 2 days ago which is not uncommon for his normal.  Continue bowel program as prescribed.  5/10- good results with bowel program QOD 5/13 Continue bowel program, reports BM 2 days ago. 5/17 last BM yesterday 5/21 Last BM 01/24/22, continue neurogenic bowel program. 13.  Neurogenic bladder: Had Foley due to urinary retention and low blood pressure -01/01/2022 plans to remove Foley in the morning on 428 and start Flomax 0.4 mg q. nightly because he was having low blood pressure up until now-we will monitor and stop if BP goes low again. -01/02/2022 Foley was removed around lunchtime. -01/03/2022 patient has voided twice since Foley removal however because patient is incontinent these were recorded as an occurrence instead of a volume.  Patient family and nurse said that he has been able to void without issue since Foley has been removed. -4/30 Patient continues to void but is incontinent r/t neurogenic bladder    5/1- think voiding is overflow- need bladder scans to be clear.     5/2- change bladder scans to q4 hours- and see if can void with urinal?     5/3- voiding incontinently- worried will have more skin breakdown- will stop 5/6 continue to monitor voiding with of every 4 hour bladder scans patient is not reporting issues with voiding today however continue to monitor for possible overflow. /9- voiding but incontinent- I think it's overflow- but not retaining much- less than 300cc when scanned. 5/11- d/w colleague in [place]- pt doesn't want foley/"tube" because will stop him from regaining bladder function, even though discussed don't know if/when that would occur.  Thought about flomax to make sure empties better; vs Myrbetriq, but no bladder scans or in/out at nursing homes. D/w colleague more before making a decision- will send ot Urology outpatient after d/c.  5/12- discussed with pt- Foley- because of constant incontinence- concern for  skin breakdown.  5/13 Tried to reach family but only came on-site for brief period, with attempt to reach out to family again.  Patient says "he will think about it" wrt Foley. 5/14 Daughter present at bedside but has some additional questions while considering Foley, specific to neurogenic bladder.  Requesting doctor rounding on Monday to reach out to her. 5/16 Discussed benefits and risks of foley, initially pt declines but later says he will  consider this 5/17 Continue to discuss foley, pt says he will decide by tomorrow 5/18 Pt declines foley, discussed possible skin and GU risks 5/19 20 minute discussion regarding neurologic bladder treatment, DSD risks, skin breakdown. Pt declines foley and not interested in regular IC 5/20 discussion with daughter and patient about benefits and risks of Foley cath.  They both are deliberating.  Patient has has some voiding but is not fully emptying bladder which can pose infection risk.  Patient and daughter acknowledged understanding but still wish to wait. Nurse tried to straight cath at 1200 when bladder scan volume was 358 ml, but met resistance, later pt voided some,  with post-void scan 291 ml. -May need Foley for volume >350 ml if I/O unsuccessful. 5/21 Patient and daughter still want to wait on Foley.  Last bladder scan this am 194 ml.  Continue q4 hr bladder scans, with I/O for >350 ml.  14.  Azotemia - 01/01/2022 BUN 38 was 21-we will give IVF's-50 cc/h and recheck in a.m. if not better tomorrow, will not remove Foley. 01/02/2022 BUN down to 33-we will continue IVF's for the rest of the day today and remove Foley this afternoon patient advised to drink more and recheck labs Monday morning 01/05/2022 -4/29 BUN trending down at 33 (4/28) from 38 on 4/27 -4/30 qMonday labs to trend 5/1- BUN down to 24- con't to push fluids 5/4 BUN down to 23, continue to monitor 5/6 BUN at 24 creatinine at 0.81 encourage fluids adequate hydration. 5/7 trend with qMonday labs. 5/9- BUN 25- slightly dry but doesn't want IVFs- will push PO 5/16 BUN 29, encourage PO intake 5/20 BUN 32, encourage PO intake with cues. 5/21 Trend qMonday labs.     Latest Ref Rng & Units 01/24/2022    5:15 AM 01/20/2022    5:20 AM 01/19/2022    5:14 AM  BMP  Glucose 70 - 99 mg/dL 84   86   88    BUN 8 - 23 mg/dL 32   29   27    Creatinine 0.61 - 1.24 mg/dL 0.74   0.78   0.78    Sodium 135 - 145 mmol/L 137   137   137    Potassium 3.5 - 5.1 mmol/L 3.5   3.9   3.9    Chloride 98 - 111 mmol/L 108   107   106    CO2 22 - 32 mmol/L 22   26   24     Calcium 8.9 - 10.3 mg/dL 8.8   8.5   8.5      15.  Cholelithiasis-  -4/28 no acute cholecystitis 16. LE edema- nonpitting             5/3- will reorder TEDs- thigh highs. For during day.              5/4- OT to provide teds today             5/6 continue to implement TEDs with help from OT  5/7 Worked with PT today, continue use of TED hose. 17.UTI             -5/5 Keflex was started for treatment             -Follow culture results           5/6 continue Keflex culture results are still pending we will follow up once these are  posted. 5/7 Urine culture back, >10,000 E. Coli, pan sensitive.  Since Keflex good for E. Coli will continue current treatment.             5/9- PO ABX for 7 days due to complicated UTI             5/12- finished Po ABX 18. Headaches 5/14 Patient reports today that he has had nighttime headaches, several nights while here in Rehab.   -He has had no unusual spikes in b/p, denies any other symptoms occurring with his headaches. -He has been given Tylenol, but this hasn't worked well.  Requested Alleve, but did have one elevated Crt 1.30 (4/22).  NSAID's may be safe, 3 weeks out, and Crt normalized.  But will start a trial of Fioricet q6h prn first. -start topamax 34m HS -5/18 HA improved today 5/20 reports some issues with headaches today, continue Topamax and Tylenol 5/21 No headache today or overnight, CTM. 20. Lower extremity weakness: discussed with TLovena Leand Tom trying e-stim   LOS: 25 days A FACE TO FAlbion FNP 01/25/2022, 11:18 AM

## 2022-01-25 NOTE — Progress Notes (Signed)
Occupational Therapy Discharge Summary  Patient Details  Name: Allen Arias MRN: [id number] Date of Birth: [birth year]    Patient has met 6 of 9 long term goals due to {due HY:8502774}.  Pt made steady progress with BADLs and functional transfers during this admission. Pt completes UB bathing/dressing tasks with supervision. Pt requires max A/tot A for LB dressing tasks and is dependent for toileting tasks at bed level. SB transfers to [facility] and TTB with CGA. Pt discharging to SNF for further rehab. Patient to discharge at overall {LOA:3049010} level.  Patient's care partner {care partner:[id number]} to provide the necessary {assistance:3041652} assistance at discharge.    Reasons goals not met: Pt requires mod A for bathing tasks at bed level and sitting EOB. Pt requires max A for LB dressing tasks at bed level. Pt requires tot A for toileting tasks.   Recommendation:  Patient will benefit from ongoing skilled OT services in {setting:3041680} to continue to advance functional skills in the area of {ADL/iADL:3041649}.  Equipment: No equipment provided  Reasons for discharge: {Reason for discharge:3049018}  Patient/family agrees with progress made and goals achieved: {Pt/Family agree with progress/goals:3049020}  OT Discharge ADL ADL Eating: Set up Where Assessed-Eating:  (per pt report) Grooming: Setup Where Assessed-Grooming: Wheelchair Upper Body Bathing: Modified independent Where Assessed-Upper Body Bathing: Sitting at sink, Wheelchair Lower Body Bathing: Moderate assistance Where Assessed-Lower Body Bathing: Bed level Upper Body Dressing: Independent Where Assessed-Upper Body Dressing: Wheelchair, Sitting at sink Lower Body Dressing: Maximal assistance Where Assessed-Lower Body Dressing: Bed level Toileting: Maximal assistance Where Assessed-Toileting: Bed level Toilet Transfer: Contact guard Toilet Transfer Equipment: Drop arm bedside commode Vision Baseline Vision/History: 0  No visual deficits;1 Wears glasses (readers) Patient Visual Report: Blurring of vision Vision Assessment?: No apparent visual deficits Perception  Perception: Within Functional Limits Praxis Praxis: Intact Cognition Cognition Overall Cognitive Status: Within Functional Limits for tasks assessed Arousal/Alertness: Awake/alert Orientation Level: Place;Situation;Person Person: Oriented Place: Oriented Situation: Oriented Memory: Appears intact Attention: Focused;Sustained;Selective Focused Attention: Appears intact Selective Attention: Appears intact Awareness: Appears intact Problem Solving: Appears intact Safety/Judgment: Appears intact Brief Interview for Mental Status (BIMS) Repetition of Three Words (First Attempt): 3 Temporal Orientation: Year: Correct Temporal Orientation: Month: Accurate within 5 days Temporal Orientation: Day: Correct Recall: "Sock": Yes, no cue required Recall: "Blue": Yes, no cue required Recall: "Bed": Yes, no cue required BIMS Summary Score: 15 Sensation Sensation Light Touch: Appears Intact Hot/Cold: Appears Intact Proprioception: Appears Intact Stereognosis: Not tested Coordination Gross Motor Movements are Fluid and Coordinated: No Fine Motor Movements are Fluid and Coordinated: Yes Heel Shin Test: unable to assess 2/2 paraplegia Motor  Motor Motor: Abnormal postural alignment and control;Paraplegia;Abnormal tone Motor - Skilled Clinical Observations: 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 Motor - Discharge Observations: 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 Mobility  Bed Mobility Bed Mobility: Supine to Sit;Sit to Supine Supine to Sit: Contact Guard/Touching assist Sit to Supine: Contact Guard/Touching assist  Trunk/Postural Assessment  Cervical Assessment Cervical Assessment: Exceptions to [facility] (forward head) Thoracic Assessment Thoracic Assessment: Exceptions to [facility] (rounded shoulders) Lumbar Assessment Lumbar Assessment: Exceptions to [facility] (posterior pelvic  tilt) Postural Control Postural Control: Deficits on evaluation (requires UE support)  Balance Static Sitting Balance Static Sitting - Balance Support: Feet supported;Bilateral upper extremity supported Static Sitting - Level of Assistance: 6: Modified independent (Device/Increase time) Dynamic Sitting Balance Dynamic Sitting - Balance Support: Feet unsupported;During functional activity;Bilateral upper extremity supported Dynamic Sitting - Level of Assistance: 5: Stand by assistance (CGA) Extremity/Trunk Assessment RUE Assessment RUE Assessment: Within Functional Limits Passive Range  of Motion (PROM) Comments: WFL Active Range of Motion (AROM) Comments: WFL General Strength Comments: grossly 4+/5 mmt LUE Assessment LUE Assessment: Within Functional Limits Passive Range of Motion (PROM) Comments: WFL Active Range of Motion (AROM) Comments: WFL General Strength Comments: grossly 4/5 mmt   Precious Haws 01/25/2022, 9:50 AM

## 2022-01-25 NOTE — Plan of Care (Signed)
  Problem: Consults ?Goal: RH STROKE PATIENT EDUCATION ?Description: See Patient Education module for education specifics  ?Outcome: Progressing ?  ?Problem: RH BOWEL ELIMINATION ?Goal: RH STG MANAGE BOWEL WITH ASSISTANCE ?Description: STG Manage Bowel with Min Assistance. ?Outcome: Progressing ?Goal: RH STG MANAGE BOWEL W/MEDICATION W/ASSISTANCE ?Description: STG Manage Bowel with Medication with Min Assistance. ?Outcome: Progressing ?  ?Problem: RH BLADDER ELIMINATION ?Goal: RH STG MANAGE BLADDER WITH ASSISTANCE ?Description: STG Manage Bladder With Min Assistance ?Outcome: Progressing ?Goal: RH STG MANAGE BLADDER WITH MEDICATION WITH ASSISTANCE ?Description: STG Manage Bladder With Medication With Min Assistance. ?Outcome: Progressing ?  ?Problem: RH SKIN INTEGRITY ?Goal: RH STG MAINTAIN SKIN INTEGRITY WITH ASSISTANCE ?Description: STG Maintain Skin Integrity With Min Assistance. ?Outcome: Progressing ?Goal: RH STG ABLE TO PERFORM INCISION/WOUND CARE W/ASSISTANCE ?Description: STG Able To Perform Incision/Wound Care With Min Assistance. ?Outcome: Progressing ?  ?Problem: RH SAFETY ?Goal: RH STG ADHERE TO SAFETY PRECAUTIONS W/ASSISTANCE/DEVICE ?Description: STG Adhere to Safety Precautions With Cues and Reminders. ?Outcome: Progressing ?Goal: RH STG DECREASED RISK OF FALL WITH ASSISTANCE ?Description: STG Decreased Risk of Fall With Min Assistance. ?Outcome: Progressing ?  ?

## 2022-01-25 NOTE — Progress Notes (Signed)
Physical Therapy Discharge Summary  Patient Details  Name: Allen Arias MRN: [id number] Date of Birth: 11/17/1940   Patient has met 5 of 6 long term goals due to improved activity tolerance, improved balance, improved postural control, increased strength, and ability to compensate for deficits.  Patient to discharge at a wheelchair level Supervision.   Patient's care partner unavailable to provide the necessary physical assistance at discharge, therefore pt will d/c to LTC.  Reasons goals not met: Pt did not meet car transfer goal of min A as he requires mod A for BLE management in/out of vehicle. Therapy sessions were not focused on car transfers as plan was for pt to d/c to LTC facility.  Recommendation:  Patient will benefit from ongoing skilled PT services in  LTC setting  to continue to advance safe functional mobility, address ongoing impairments in endurance, strength, balance, safety, independence with functional mobility, and minimize fall risk.  Equipment: No equipment provided. TBD by accepting facility. Discussed specialty wheelchairs Psychiatrist) with patient and would recommend that pt and family pursue a specialty w/c for increased independence with functional mobility.  Reasons for discharge: treatment goals met and discharge from hospital  Patient/family agrees with progress made and goals achieved: Yes  PT Discharge Precautions/Restrictions Precautions Precautions: Fall Restrictions Weight Bearing Restrictions: No Pain Interference Pain Interference Pain Effect on Sleep: 1. Rarely or not at all Pain Interference with Therapy Activities: 1. Rarely or not at all Pain Interference with Day-to-Day Activities: 1. Rarely or not at all Vision/Perception  Vision - History Baseline Vision: No visual deficits Perception Perception: Within Functional Limits Praxis Praxis: Intact  Cognition Overall Cognitive Status: Within Functional Limits for tasks  assessed Arousal/Alertness: Awake/alert Orientation Level: Oriented X4 Year: 2023 Attention: Focused;Sustained;Selective Focused Attention: Appears intact Sustained Attention: Appears intact Selective Attention: Appears intact Memory: Appears intact Awareness: Appears intact Problem Solving: Appears intact Safety/Judgment: Appears intact Sensation Sensation Light Touch: Impaired Detail Light Touch Impaired Details: Impaired RLE;Impaired LLE Proprioception: Impaired Detail Proprioception Impaired Details: Impaired RLE;Impaired LLE Coordination Gross Motor Movements are Fluid and Coordinated: No Fine Motor Movements are Fluid and Coordinated: Yes Coordination and Movement Description: 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 Heel Shin Test: unable to assess 2/2 paraplegia Motor  Motor Motor: Paraplegia;Abnormal tone;Abnormal postural alignment and control Motor - Discharge Observations: paraplegia in RLE>LLE  Mobility Bed Mobility Bed Mobility: Rolling Right;Rolling Left;Supine to Sit;Sit to Supine Rolling Right: Supervision/verbal cueing Rolling Left: Supervision/Verbal cueing Supine to Sit: Supervision/Verbal cueing Sit to Supine: Supervision/Verbal cueing Transfers Transfers: Lateral/Scoot Transfers Lateral/Scoot Transfers: Supervision/Verbal cueing Transfer (Assistive device): Other (Comment) (slide board) Locomotion  Gait Ambulation: No Gait Gait: No Stairs / Additional Locomotion Stairs: No Pick up small object from the floor (from standing position) activity did not occur: Safety/medical concerns Product manager Mobility: Yes Wheelchair Assistance: Independent with Camera operator: Both upper extremities Wheelchair Parts Management: Independent Distance: 150  Trunk/Postural Assessment  Cervical Assessment Cervical Assessment: Exceptions to [facility] (forward head) Thoracic Assessment Thoracic Assessment: Exceptions to [facility] (rounded  shoulders) Lumbar Assessment Lumbar Assessment: Exceptions to [facility] (posterior pelvic tilt) Postural Control Postural Control: Deficits on evaluation (requires UE support for dynamic sitting balance)  Balance Balance Balance Assessed: Yes Static Sitting Balance Static Sitting - Balance Support: Feet supported;Bilateral upper extremity supported Static Sitting - Level of Assistance: 6: Modified independent (Device/Increase time) Dynamic Sitting Balance Dynamic Sitting - Balance Support: Feet unsupported;During functional activity;Bilateral upper extremity supported Dynamic Sitting - Level of Assistance: 5: Stand by assistance Extremity Assessment  RLE Assessment RLE Assessment: Exceptions to [facility] Passive Range of Motion (PROM) Comments: improved from eval, some tightness in hip and decreased DF General Strength Comments: impaired, see below RLE Strength Right Hip Flexion: 2-/5 Right Knee Flexion: 2-/5 Right Knee Extension: 2-/5 Right Ankle Dorsiflexion: 1/5 LLE Assessment LLE Assessment: Exceptions to [facility] Passive Range of Motion (PROM) Comments: improved since eval, hip tightness and decreased DF General Strength Comments: impaired, see below LLE Strength Left Hip Flexion: 2+/5 Left Knee Flexion: 2-/5 Left Knee Extension: 2+/5 Left Ankle Dorsiflexion: 1/5     Excell Seltzer, PT, DPT, CSRS 01/25/2022, 4:25 PM

## 2022-01-25 NOTE — Progress Notes (Signed)
Occupational Therapy Session Note  Patient Details  Name: Allen Arias MRN: [id number] Date of Birth: November 08, 1940  Today's Date: 01/25/2022 OT Individual Time: 0902-1006 OT Individual Time Calculation (min): 64 min    Short Term Goals: Week 4:  OT Short Term Goal 1 (Week 4): STG=LTG 2/2 ELOS  Skilled Therapeutic Interventions/Progress Updates:  Pt greeted supine in bed reporting feeling flu symptoms last night/ today but agreeable to OT intervention, video interpreter utilized during session. pt reports wanting to get to sink for ADLs only d/t general malaise from flu symptoms. Session focus on BADL reeducation, functional mobility, and decreasing overall caregiver burden.           Pt donned pants from supine with MOD A via long sitting with pt rolling R and L to pull pants up to waist line,   pt completed supine>sit with bed features with CGA with + time and effort. Pt completed SB transfer to w/c to L side with total A for SB placement and CGA for transfer. Pt completed grooming tasks at sink with supervision including shaving and washing face. Completed necessary grad day assessments as indicated below. Pt completed  SB transfer back to bed with total A for SB placement and CGA for transfer, CGA for sit >supine.  pt left supine in bed with bed alarm activated and all needs within reach.                      Therapy Documentation Precautions:  Precautions Precautions: Fall Precaution Comments: foley Restrictions Weight Bearing Restrictions: No   Pain: no pain reported during session  Pain Assessment Pain Scale: 0-10 Pain Score: 0-No pain ADL: ADL Eating: Set up Where Assessed-Eating:  (per pt report) Grooming: Setup Where Assessed-Grooming: Wheelchair Upper Body Bathing: Minimal assistance Where Assessed-Upper Body Bathing: Bed level Lower Body Bathing: Maximal assistance Where Assessed-Lower Body Bathing: Bed level Upper Body Dressing: Maximal assistance Where Assessed-Upper  Body Dressing: Edge of bed Lower Body Dressing: Dependent Where Assessed-Lower Body Dressing: Bed level Toileting: Dependent Toilet Transfer: Unable to assess Vision Baseline Vision/History: 0 No visual deficits;1 Wears glasses (readers) Patient Visual Report: Blurring of vision Vision Assessment?: No apparent visual deficits Perception  Perception: Within Functional Limits Praxis Praxis: Intact Cognition Cognition Overall Cognitive Status: Within Functional Limits for tasks assessed Arousal/Alertness: Awake/alert Orientation Level: Place;Situation;Person Person: Oriented Place: Oriented Situation: Oriented Memory: Appears intact Attention: Focused;Sustained;Selective Focused Attention: Appears intact Selective Attention: Appears intact Awareness: Appears intact Problem Solving: Appears intact Safety/Judgment: Appears intact Brief Interview for Mental Status (BIMS) Repetition of Three Words (First Attempt): 3 Temporal Orientation: Year: Correct Temporal Orientation: Month: Accurate within 5 days Temporal Orientation: Day: Correct Recall: "Sock": Yes, no cue required Recall: "Blue": Yes, no cue required Recall: "Bed": Yes, no cue required BIMS Summary Score: 15 Sensation Sensation Light Touch: Appears Intact Hot/Cold: Appears Intact Proprioception: Appears Intact Stereognosis: Not tested Coordination Gross Motor Movements are Fluid and Coordinated: No Fine Motor Movements are Fluid and Coordinated: Yes Heel Shin Test: unable to assess 2/2 paraplegia Motor  Motor Motor: Abnormal postural alignment and control;Paraplegia;Abnormal tone Motor - Skilled Clinical Observations: 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 Motor - Discharge Observations: 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 Mobility  Bed Mobility Bed Mobility: Supine to Sit;Sit to Supine Supine to Sit: Contact Guard/Touching assist Sit to Supine: Contact Guard/Touching assist  Trunk/Postural Assessment  Cervical Assessment Cervical  Assessment: Exceptions to [facility] (forward head) Thoracic Assessment Thoracic Assessment: Exceptions to [facility] (rounded shoulders) Lumbar Assessment Lumbar Assessment: Exceptions to [facility] (posterior  pelvic tilt) Postural Control Postural Control: Deficits on evaluation (requires UE support)  Balance Static Sitting Balance Static Sitting - Balance Support: Feet supported;Bilateral upper extremity supported Static Sitting - Level of Assistance: 6: Modified independent (Device/Increase time) Dynamic Sitting Balance Dynamic Sitting - Balance Support: Feet unsupported;During functional activity;Bilateral upper extremity supported Dynamic Sitting - Level of Assistance: 5: Stand by assistance (CGA) Extremity/Trunk Assessment RUE Assessment RUE Assessment: Within Functional Limits Passive Range of Motion (PROM) Comments: WFL Active Range of Motion (AROM) Comments: WFL General Strength Comments: grossly 4+/5 mmt LUE Assessment LUE Assessment: Within Functional Limits Passive Range of Motion (PROM) Comments: WFL Active Range of Motion (AROM) Comments: WFL General Strength Comments: grossly 4/5 mmt  Therapy/Group: Individual Therapy  Pollyann Glen [facility] 01/25/2022, 12:03 PM

## 2022-01-25 NOTE — Progress Notes (Signed)
Patient was not ready to get in the bed. Will attempt before shift change. Patient stated that he will notify staff when he is ready. Cletis Media, LPN

## 2022-01-25 NOTE — Progress Notes (Signed)
Physical Therapy Session Note  Patient Details  Name: Allen Arias MRN: [id number] Date of Birth: [birth year]  Today's Date: 01/25/2022 PT Individual Time: 8250-5397 PT Individual Time Calculation (min): 68 min   Short Term Goals: Week 3:  PT Short Term Goal 1 (Week 3): Will be able to perform bed/chair transfer CGA with good demonstration of head/hips relationship PT Short Term Goal 1 - Progress (Week 3): Met PT Short Term Goal 2 (Week 3): Will be able to maintain dynamic sitting balance with CGA consistently PT Short Term Goal 2 - Progress (Week 3): Met PT Short Term Goal 3 (Week 3): Will be able to manage w/c parts to set chair up for transfer with Supervision and cueing PT Short Term Goal 3 - Progress (Week 3): Met  Skilled Therapeutic Interventions/Progress Updates:  Pt received seated in bed, agreeable to PT session. No complaints of pain. Bed mobility performed at Supervision level this session. Slide board transfers bed to/from w/c at Supervision level. Manual w/c propulsion up to 150 ft with use of BUE at mod I level. Demonstrated how to perform safe car transfer. Pt able to perform car transfer with mod A needed for BLE management in/out of car and with cues for head/hips relationship during transfer. Pt found to be incontinent of urine in his brief, returned to bed. Rolling L/R at Supervision level while pericare and brief change performed due to large amount of urine incontinence. Pt reports he had requested foley catheter be placed prior to d/c, sent secure chat to treatment team to ensure they were aware of pt's request for foley. Pt left seated in bed with needs in reach, bed alarm in place at end of session. Utilized [employer] video interpreters Corrinne Eagle [phone] and Gay Filler #[phone] during session.  Therapy Documentation Precautions:  Precautions Precautions: Fall Precaution Comments: foley Restrictions Weight Bearing Restrictions: No       Therapy/Group: Individual  Therapy   Excell Seltzer, PT, DPT, CSRS 01/25/2022, 4:17 PM

## 2022-01-26 ENCOUNTER — Encounter (HOSPITAL_COMMUNITY): Payer: Self-pay | Admitting: Physical Medicine and Rehabilitation

## 2022-01-26 DIAGNOSIS — R519 Headache, unspecified: Secondary | ICD-10-CM

## 2022-01-26 DIAGNOSIS — G4700 Insomnia, unspecified: Secondary | ICD-10-CM

## 2022-01-26 LAB — COMPREHENSIVE METABOLIC PANEL
ALT: 23 U/L (ref 0–44)
AST: 20 U/L (ref 15–41)
Albumin: 3 g/dL — ABNORMAL LOW (ref 3.5–5.0)
Alkaline Phosphatase: 49 U/L (ref 38–126)
Anion gap: 5 (ref 5–15)
BUN: 44 mg/dL — ABNORMAL HIGH (ref 8–23)
CO2: 23 mmol/L (ref 22–32)
Calcium: 8.8 mg/dL — ABNORMAL LOW (ref 8.9–10.3)
Chloride: 109 mmol/L (ref 98–111)
Creatinine, Ser: 0.98 mg/dL (ref 0.61–1.24)
GFR, Estimated: >60 mL/min (ref >60)
Glucose, Bld: 87 mg/dL (ref 70–99)
Potassium: 3.7 mmol/L (ref 3.5–5.1)
Sodium: 137 mmol/L (ref 135–145)
Total Bilirubin: 1.7 mg/dL — ABNORMAL HIGH (ref 0.3–1.2)
Total Protein: 5.5 g/dL — ABNORMAL LOW (ref 6.5–8.1)

## 2022-01-26 LAB — CBC
HCT: 28.9 % — ABNORMAL LOW (ref 39.0–52.0)
Hemoglobin: 9.1 g/dL — ABNORMAL LOW (ref 13.0–17.0)
MCH: 33 pg (ref 26.0–34.0)
MCHC: 31.5 g/dL (ref 30.0–36.0)
MCV: 104.7 fL — ABNORMAL HIGH (ref 80.0–100.0)
Platelets: 157 10*3/uL (ref 150–400)
RBC: 2.76 MIL/uL — ABNORMAL LOW (ref 4.22–5.81)
RDW: 17.8 % — ABNORMAL HIGH (ref 11.5–15.5)
WBC: 7.7 10*3/uL (ref 4.0–10.5)
nRBC: 0 % (ref 0.0–0.2)

## 2022-01-26 LAB — SARS CORONAVIRUS 2 (TAT 6-24 HRS): SARS Coronavirus 2: NEGATIVE

## 2022-01-26 MED ORDER — ZOLPIDEM TARTRATE 5 MG PO TABS: 5.0000 mg | ORAL_TABLET | Freq: Every day | ORAL | 0 refills | Status: DC

## 2022-01-26 MED ORDER — CHLORHEXIDINE GLUCONATE CLOTH 2 % EX PADS: 6.0000 | MEDICATED_PAD | Freq: Every day | CUTANEOUS | Status: DC

## 2022-01-26 MED ORDER — BUTALBITAL-APAP-CAFFEINE 50-325-40 MG PO TABS: 1.0000 | ORAL_TABLET | Freq: Every day | ORAL | 0 refills | Status: AC | PRN

## 2022-01-26 MED ORDER — CHLORHEXIDINE GLUCONATE CLOTH 2 % EX PADS: 6.0000 | MEDICATED_PAD | Freq: Two times a day (BID) | CUTANEOUS | Status: DC

## 2022-01-26 MED ORDER — DUTASTERIDE 0.5 MG PO CAPS
0.5000 mg | ORAL_CAPSULE | Freq: Every day | ORAL | Status: AC
Start: 2022-01-26 — End: ?

## 2022-01-26 MED ORDER — TOPIRAMATE 25 MG PO TABS: 25.0000 mg | ORAL_TABLET | Freq: Every day | ORAL | Status: DC

## 2022-01-26 NOTE — Progress Notes (Signed)
Inpatient Rehabilitation Care Coordinator Discharge Note   Patient Details  Name: Allen Arias MRN: [id number] Date of Birth: 01-Mar-1941   Discharge location: D/c to SNF  Length of Stay: 25 days  Discharge activity level: wheelchair level Supervision  Home/community participation: Limited  Patient response EX:HBZJIR Literacy - How often do you need to have someone help you when you read instructions, pamphlets, or other written material from your doctor or pharmacy?: Never  Patient response CV:ELFYBO Isolation - How often do you feel lonely or isolated from those around you?: Patient unable to respond  Services provided included: MD, RD, PT, OT, RN, CM, TR, Pharmacy, Neuropsych, SW  Financial Services:  Field seismologist Utilized: Medicare    Choices offered to/list presented to: Yes  Follow-up services arranged:   Patient response to transportation need: Is the patient able to respond to transportation needs?: Yes In the past 12 months, has lack of transportation kept you from medical appointments or from getting medications?: No In the past 12 months, has lack of transportation kept you from meetings, work, or from getting things needed for daily living?: No    Comments (or additional information):  Patient/Family verbalized understanding of follow-up arrangements:  Yes  Individual responsible for coordination of the follow-up plan: contact pt dtr [name]  Confirmed correct DME delivered: Gretchen Short 01/26/2022    Gretchen Short

## 2022-01-26 NOTE — Progress Notes (Signed)
Patient ID: Allen Arias, male   DOB: August 30, 1941, 81 y.o.   MRN: [id number]  SW confirms d/c to [facility] today. Nurse report [phone]; Rm#[id number].   Loralee Pacas, MSW, [place] Office: [phone] Cell: [phone] Fax: [phone]

## 2022-01-26 NOTE — Progress Notes (Signed)
[facility] picked up [facility]. Transport to [facility]. Transport received all paperwork. Foley in place. Nurse called to give report no answer from facility. Cletis Media, LPN

## 2022-01-26 NOTE — Progress Notes (Signed)
Inpatient Rehabilitation Discharge Medication Review by a Pharmacist  A complete drug regimen review was completed for this patient to identify any potential clinically significant medication issues.  High Risk Drug Classes Is patient taking? Indication by Medication  Antipsychotic No   Anticoagulant Yes Lovenox- VTE prophylaxis  Antibiotic No   Opioid No   Antiplatelet No   Hypoglycemics/insulin No   Vasoactive Medication No   Chemotherapy No   Other Yes Celexa- MDD Avodart- BPH Melatonin, ambien- sleep Zocor- HLD Topamax- headache prophylaxis     Type of Medication Issue Identified Description of Issue Recommendation(s)  Drug Interaction(s) (clinically significant)     Duplicate Therapy     Allergy     No Medication Administration End Date     Incorrect Dose     Additional Drug Therapy Needed     Significant med changes from prior encounter (inform family/care partners about these prior to discharge).    Other       Clinically significant medication issues were identified that warrant physician communication and completion of prescribed/recommended actions by midnight of the next day:  No   Time spent performing this drug regimen review (minutes):  30  Jadon Harbaugh BS, PharmD, BCPS Clinical Pharmacist 01/26/2022 11:26 AM  Contact: [phone] after 3 PM  "Be curious, not judgmental..." -Jamal Maes

## 2022-01-26 NOTE — Plan of Care (Signed)
  Problem: Consults Goal: RH STROKE PATIENT EDUCATION Description: See Patient Education module for education specifics  Outcome: Completed/Met   Problem: RH BOWEL ELIMINATION Goal: RH STG MANAGE BOWEL WITH ASSISTANCE Description: STG Manage Bowel with [place]. Outcome: Completed/Met Goal: RH STG MANAGE BOWEL W/MEDICATION W/ASSISTANCE Description: STG Manage Bowel with Medication with [place]. Outcome: Completed/Met   Problem: RH BLADDER ELIMINATION Goal: RH STG MANAGE BLADDER WITH ASSISTANCE Description: STG Manage Bladder With Min Assistance Outcome: Completed/Met Goal: RH STG MANAGE BLADDER WITH MEDICATION WITH ASSISTANCE Description: STG Manage Bladder With Medication With Min Assistance. Outcome: Completed/Met   Problem: RH SKIN INTEGRITY Goal: RH STG MAINTAIN SKIN INTEGRITY WITH ASSISTANCE Description: STG Maintain Skin Integrity With [place]. Outcome: Completed/Met Goal: RH STG ABLE TO PERFORM INCISION/WOUND CARE W/ASSISTANCE Description: STG Able To Perform Incision/Wound Care With [employer]. Outcome: Completed/Met   Problem: RH SAFETY Goal: RH STG ADHERE TO SAFETY PRECAUTIONS W/ASSISTANCE/DEVICE Description: STG Adhere to Safety Precautions With Cues and Reminders. Outcome: Completed/Met Goal: RH STG DECREASED RISK OF FALL WITH ASSISTANCE Description: STG Decreased Risk of Fall With [employer]. Outcome: Completed/Met

## 2022-02-04 ENCOUNTER — Ambulatory Visit (INDEPENDENT_AMBULATORY_CARE_PROVIDER_SITE_OTHER): Payer: Medicare Other | Admitting: Vascular Surgery

## 2022-02-04 DIAGNOSIS — I7143 Infrarenal abdominal aortic aneurysm, without rupture: Secondary | ICD-10-CM

## 2022-02-04 NOTE — Progress Notes (Signed)
       Virtual Visit via Telephone Note    I connected with Allen Arias on 02/04/2022 using the telephone and I was speaking with his daughter as the patient was tired after a urology follow-up appointment today. PCP: [name], MD   Chief Complaint: both leg incisions  History of Present Illness: Allen Arias is a 81 y.o. male with status post endovascular aneurysm repair complicated by bilateral lower extremity ischemia requiring thrombectomy and also spinal cord ischemia with paraplegia resulting.  He is now in [facility].  Per the daughter he has no increased movement of his lower extremities but his incisions are healing well.  Past Medical History:  Diagnosis Date   AAA (abdominal aortic aneurysm) (HCC)    BPH (benign prostatic hyperplasia)    Elevated lipids    Hypertension    Nocturia    UTI (urinary tract infection) 01/19/2022    Past Surgical History:  Procedure Laterality Date   ABDOMINAL AORTIC ENDOVASCULAR STENT GRAFT N/A 12/26/2021   Procedure: ABDOMINAL AORTIC ENDOVASCULAR STENT GRAFT;  Surgeon: Maeola Harman, MD;  Location: [facility] OR;  Service: Vascular;  Laterality: N/A;   CARPAL TUNNEL RELEASE     ENDARTERECTOMY FEMORAL Right 12/26/2021   Procedure: RIGHT ENDARTERECTOMY FEMORAL;  Surgeon: Maeola Harman, MD;  Location: [facility] OR;  Service: Vascular;  Laterality: Right;   ENDARTERECTOMY POPLITEAL Left 12/26/2021   Procedure: LEFT ENDARTERECTOMY POPLITEAL WITH ANGIOPLASTY USING XENOSURE BIOPATCH (1cmX6cm);  Surgeon: Maeola Harman, MD;  Location: [facility] OR;  Service: Vascular;  Laterality: Left;   EYE SURGERY     PATCH ANGIOPLASTY Bilateral 12/26/2021   Procedure: PATCH ANGIOPLASTY USING XENOSURE BIOLOGIC PATCH (1cmx6cm);  Surgeon: Maeola Harman, MD;  Location: [facility] OR;  Service: Vascular;  Laterality: Bilateral;   THROMBECTOMY FEMORAL ARTERY Bilateral 12/26/2021   Procedure: BILATERAL FEMORAL ARTERY THROMBECTOMY;  Surgeon: Maeola Harman, MD;  Location: [facility] OR;  Service: Vascular;  Laterality: Bilateral;   ULTRASOUND GUIDANCE FOR VASCULAR ACCESS Bilateral 12/26/2021   Procedure: ULTRASOUND GUIDANCE FOR VASCULAR ACCESS;  Surgeon: Maeola Harman, MD;  Location: [facility] OR;  Service: Vascular;  Laterality: Bilateral;    No outpatient medications have been marked as taking for the 02/04/22 encounter (Appointment) with Maeola Harman, MD.    12 system ROS was negative unless otherwise noted in HPI   Observations/Objective: Patient was asleep I had a phone call follow-up with the daughter  Assessment and Plan: 81 year old male status post EVAR with bilateral lower extremity thrombectomy complicated by paraplegia now in rehab.  I will get him scheduled follow-up in 4 to 6 weeks with bilateral ABIs and EVAR duplex.  Follow Up Instructions:   Follow up 4 to 6 weeks   I spent 10 minutes with the patient's daughter via telephone encounter.   Signed, Lemar Livings [facility] Office: [phone]  02/04/2022, 4:17 PM

## 2022-02-10 ENCOUNTER — Other Ambulatory Visit: Payer: Self-pay | Admitting: *Deleted

## 2022-02-10 DIAGNOSIS — Z9889 Other specified postprocedural states: Secondary | ICD-10-CM

## 2022-02-11 ENCOUNTER — Encounter
Payer: Medicare Other | Attending: Physical Medicine and Rehabilitation | Admitting: Physical Medicine and Rehabilitation

## 2022-02-11 ENCOUNTER — Encounter: Payer: Self-pay | Admitting: Physical Medicine and Rehabilitation

## 2022-02-11 VITALS — BP 147/87 | HR 101 | Ht 64.0 in | Wt 106.4 lb

## 2022-02-11 DIAGNOSIS — G8220 Paraplegia, unspecified: Secondary | ICD-10-CM | POA: Insufficient documentation

## 2022-02-11 DIAGNOSIS — N319 Neuromuscular dysfunction of bladder, unspecified: Secondary | ICD-10-CM | POA: Insufficient documentation

## 2022-02-11 DIAGNOSIS — K592 Neurogenic bowel, not elsewhere classified: Secondary | ICD-10-CM | POA: Insufficient documentation

## 2022-02-11 DIAGNOSIS — R630 Anorexia: Secondary | ICD-10-CM | POA: Insufficient documentation

## 2022-02-11 DIAGNOSIS — F329 Major depressive disorder, single episode, unspecified: Secondary | ICD-10-CM | POA: Insufficient documentation

## 2022-02-11 DIAGNOSIS — G9511 Acute infarction of spinal cord (embolic) (nonembolic): Secondary | ICD-10-CM | POA: Insufficient documentation

## 2022-02-11 NOTE — Patient Instructions (Signed)
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   Need to continue Lovenox until 03/26/22 to reduce risk of DVT/PE. Can stop on 7/20 due to CHEST guidelines.  2.  Mood/appetite-Suggest increasing Mirtazapine /Remeron to 15 mg nightly-  3. Think patient is developing  at - level SCI pain- he needs something for nerve pain- suggest switching Celexa 20 mg daily to Duloxetine 30 mg nightly for nerve pain- will also look at adding gabapentin for nerve pain  4. Also strongly suggest adding gabapentin 300 mg nightly x 3 days to see if can tolerate it- then increase to 300 mg TID x 1 week then 600 mg BID- for at level SCI pain/nerve pain. Monitor for sedation.   5. So, if leaves nursing home anytime soon; give me a call PRIOR and I will arrange for [employer] to get him a loaner w/c until he would be fitted for his own w/c. VERY IMPORTANT!!!  6. Think he needs to stay in nursing home until he can do his own transfers from w/c to bed.   7. Con't Dulcolax suppositories, but might need oral meds to be increased to help him with constipation.    8. I would hope that patient can stay in Rehab side of things because he's making strength improvements and sounds like functional improvements at this time.    9. F/u in 3 months- double appt SCI

## 2022-02-11 NOTE — Progress Notes (Signed)
Subjective:    Patient ID: Allen Arias, male    DOB: 07/10/1941, 81 y.o.   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   Not good- Every 3-5 days- Sudden chil and shivers- and has to hold him firmly.  Tylenol helps after 30 minutes.  All month-   Been very depressed poor motivation.  Lost a lot of weight- and very depressed.   Started Remeron 7.5 mg 4 days ago- stil not eating.   Working with therapy- always willing to work, but because his strength is low-  Had bloody urine- found UTI- last week and started PO ABX. Got rid of Foley and using brief.    Pain -at incision area- sharp pain- stabbing- and radiates down into legs- tightness as well.  Also sore.   Neurogenic bowel- having BM's every 3-4 days in brief per daughter. Has Rx for suppository every other evening- not getting dig stim.      Pain Inventory Average Pain 5 Pain Right Now 5 My pain is sharp  LOCATION OF PAIN  thigh knee leg  BOWEL Number of stools per week: not answered Oral laxative use Yes  Type of laxative miralax  Enema or suppository use  bisacodyl   BLADDER Pads  Mobility ability to climb steps?  no do you drive?  no use a wheelchair needs help with transfers  Function retired  Neuro/Psych bladder control problems bowel control problems weakness numbness trouble walking anxiety loss of taste or smell  Prior Studies Any changes since last visit?  no  Physicians involved in your care Any changes since last visit?  no   Family History  Problem Relation Age of Onset   Gallbladder disease Mother        cholangiocarcinoma   Gastric cancer Father    Social History   Socioeconomic History   Marital status: Married    Spouse name: Not on file   Number of children: Not  on file   Years of education: Not on file   Highest education level: Not on file  Occupational History   Not on file  Tobacco Use   Smoking status: Never   Smokeless tobacco: Never  Vaping Use   Vaping Use: Never used  Substance and Sexual Activity   Alcohol use: Never   Drug use: Never   Sexual activity: Not Currently  Other Topics Concern   Not on file  Social History Narrative   Not on file   Social Determinants of Health   Financial Resource Strain: Not on file  Food Insecurity: Not on file  Transportation Needs: Not on file  Physical Activity: Not on file  Stress: Not on file  Social Connections: Not on file   Past Surgical History:  Procedure Laterality Date   ABDOMINAL AORTIC ENDOVASCULAR STENT GRAFT N/A 12/26/2021   Procedure: ABDOMINAL AORTIC ENDOVASCULAR STENT GRAFT;  Surgeon: Maeola Harmanain, Brandon Christopher, MD;  [facility];  Service: Vascular;  Laterality: N/A;   CARPAL TUNNEL RELEASE     ENDARTERECTOMY FEMORAL Right 12/26/2021   Procedure: RIGHT ENDARTERECTOMY FEMORAL;  Surgeon: Maeola Harmanain, Brandon Christopher, MD;  [facility];  Service: Vascular;  Laterality: Right;   ENDARTERECTOMY POPLITEAL Left 12/26/2021   Procedure: LEFT ENDARTERECTOMY POPLITEAL WITH ANGIOPLASTY USING XENOSURE BIOPATCH (1cmX6cm);  Surgeon: Lemar Livingsain, Brandon  Cristal Deer, MD;  Location: [facility] OR;  Service: Vascular;  Laterality: Left;   EYE SURGERY     PATCH ANGIOPLASTY Bilateral 12/26/2021   Procedure: PATCH ANGIOPLASTY USING XENOSURE BIOLOGIC PATCH (1cmx6cm);  Surgeon: Maeola Harman, MD;  Location: [facility] OR;  Service: Vascular;  Laterality: Bilateral;   THROMBECTOMY FEMORAL ARTERY Bilateral 12/26/2021   Procedure: BILATERAL FEMORAL ARTERY THROMBECTOMY;  Surgeon: Maeola Harman, MD;  Location: [facility] OR;  Service: Vascular;  Laterality: Bilateral;   ULTRASOUND GUIDANCE FOR VASCULAR ACCESS Bilateral 12/26/2021   Procedure: ULTRASOUND GUIDANCE FOR VASCULAR ACCESS;  Surgeon: Maeola Harman, MD;  Location: [facility] OR;  Service: Vascular;  Laterality: Bilateral;   Past Medical History:  Diagnosis Date   AAA (abdominal aortic aneurysm) (HCC)    BPH (benign prostatic hyperplasia)    Elevated lipids    Hypertension    Nocturia    UTI (urinary tract infection) 01/19/2022   BP (!) 147/87   Pulse (!) 101   Ht 5\' 4"  (1.626 m) Comment: last recorded  Wt 106 lb 6.4 oz (48.3 kg) Comment: ;ast recorded at [facility]  SpO2 96%   BMI 18.26 kg/m   Opioid Risk Score:   Fall Risk Score:  `1  Depression screen [facility] 2/9     02/11/2022   10:44 AM  Depression screen PHQ 2/9  Decreased Interest 1  Down, Depressed, Hopeless 1  PHQ - 2 Score 2  Altered sleeping 2  Tired, decreased energy 3  Change in appetite 0  Feeling bad or failure about yourself  2  Trouble concentrating 1  Moving slowly or fidgety/restless 1  Suicidal thoughts 0  PHQ-9 Score 11     Review of Systems  Constitutional: Negative.   HENT: Negative.         Loss of sense of smell  Eyes: Negative.   Respiratory: Negative.    Cardiovascular: Negative.   Gastrointestinal:        Bowel control  Endocrine: Negative.   Genitourinary:        Bladder control  Musculoskeletal:  Positive for gait problem.  Skin: Negative.   Allergic/Immunologic: Negative.   Neurological:  Positive for weakness and numbness.  Hematological:  Bruises/bleeds easily.       Lovenox  Psychiatric/Behavioral:  Positive for dysphoric mood. The patient is nervous/anxious.   All other systems reviewed and are negative.     Objective:   Physical Exam Awake, alert- has lost weight and he has none to lose; accompanied by daughter, in ill fitting manual w/ from SNF; NAD MS: RLE- HF 1/5; KE 2-/5; DF 0/5 and PF 2-/5 LLE- HF 2-/5; KE 2/5; DF 0/5 and PF 1/5  Neuro Slight increased tone in LLE- MAS of 1 in L knee/hip- not at ankle Sensation to light touch is intact in all 4 extremities.        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   Need to continue Lovenox until 03/26/22 to reduce risk of DVT/PE. Can stop on 7/20 due to CHEST guidelines.  2.  Mood/appetite-Suggest increasing Mirtazapine /Remeron to 15 mg nightly-  3. Think patient is developing  at - level SCI pain- he needs something for nerve pain- suggest switching Celexa 20 mg daily to Duloxetine 30 mg nightly  for nerve pain- will also look at adding gabapentin for nerve pain  4. Also strongly suggest adding gabapentin 300 mg nightly x 3 days to see if can tolerate it- then increase to 300 mg TID x 1 week then 600 mg BID- for at level SCI pain/nerve pain. Monitor for sedation.   5. So, if leaves nursing home anytime soon; give me a call PRIOR and I will arrange for [employer] to get him a loaner w/c until he would be fitted for his own w/c. VERY IMPORTANT!!!  6. Think he needs to stay in nursing home until he can do his own transfers from w/c to bed.   7. Con't Dulcolax suppositories, but might need oral meds to be increased to help him with constipation.    8. I would hope that patient can stay in Rehab side of things because he's making strength improvements and sounds like functional improvements at this time.    9. F/u in 3 months- double appt SCI   I spent a total of 42   minutes on total care today- >50% coordination of care- due to prolonged discussion on prognosis and how to get pt home. As detailed above.

## 2022-02-14 ENCOUNTER — Encounter: Payer: Self-pay | Admitting: Physical Medicine and Rehabilitation

## 2022-02-17 ENCOUNTER — Encounter: Payer: Self-pay | Admitting: Physical Medicine and Rehabilitation

## 2022-02-19 ENCOUNTER — Inpatient Hospital Stay (HOSPITAL_COMMUNITY)
Admission: EM | Admit: 2022-02-19 | Discharge: 2022-02-23 | DRG: 314 | Disposition: A | Discharge status: Hospice | Payer: Medicare Other | Source: Skilled Nursing Facility | Attending: Internal Medicine | Admitting: Internal Medicine

## 2022-02-19 ENCOUNTER — Emergency Department (HOSPITAL_COMMUNITY): Payer: Medicare Other

## 2022-02-19 DIAGNOSIS — R944 Abnormal results of kidney function studies: Secondary | ICD-10-CM | POA: Diagnosis present

## 2022-02-19 DIAGNOSIS — R748 Abnormal levels of other serum enzymes: Secondary | ICD-10-CM

## 2022-02-19 DIAGNOSIS — E785 Hyperlipidemia, unspecified: Secondary | ICD-10-CM | POA: Diagnosis present

## 2022-02-19 DIAGNOSIS — E860 Dehydration: Secondary | ICD-10-CM | POA: Diagnosis not present

## 2022-02-19 DIAGNOSIS — Z7189 Other specified counseling: Secondary | ICD-10-CM

## 2022-02-19 DIAGNOSIS — R54 Age-related physical debility: Secondary | ICD-10-CM | POA: Diagnosis present

## 2022-02-19 DIAGNOSIS — I4510 Unspecified right bundle-branch block: Secondary | ICD-10-CM | POA: Diagnosis present

## 2022-02-19 DIAGNOSIS — R627 Adult failure to thrive: Secondary | ICD-10-CM | POA: Diagnosis present

## 2022-02-19 DIAGNOSIS — Z66 Do not resuscitate: Secondary | ICD-10-CM | POA: Diagnosis not present

## 2022-02-19 DIAGNOSIS — Z8679 Personal history of other diseases of the circulatory system: Secondary | ICD-10-CM

## 2022-02-19 DIAGNOSIS — E871 Hypo-osmolality and hyponatremia: Secondary | ICD-10-CM | POA: Diagnosis present

## 2022-02-19 DIAGNOSIS — L89109 Pressure ulcer of unspecified part of back, unspecified stage: Secondary | ICD-10-CM | POA: Diagnosis present

## 2022-02-19 DIAGNOSIS — I714 Abdominal aortic aneurysm, without rupture, unspecified: Secondary | ICD-10-CM | POA: Diagnosis present

## 2022-02-19 DIAGNOSIS — Z789 Other specified health status: Secondary | ICD-10-CM

## 2022-02-19 DIAGNOSIS — Z681 Body mass index (BMI) 19 or less, adult: Secondary | ICD-10-CM

## 2022-02-19 DIAGNOSIS — E861 Hypovolemia: Secondary | ICD-10-CM | POA: Diagnosis present

## 2022-02-19 DIAGNOSIS — R52 Pain, unspecified: Secondary | ICD-10-CM | POA: Diagnosis present

## 2022-02-19 DIAGNOSIS — T827XXA Infection and inflammatory reaction due to other cardiac and vascular devices, implants and grafts, initial encounter: Principal | ICD-10-CM | POA: Diagnosis present

## 2022-02-19 DIAGNOSIS — K632 Fistula of intestine: Secondary | ICD-10-CM | POA: Diagnosis present

## 2022-02-19 DIAGNOSIS — E43 Unspecified severe protein-calorie malnutrition: Secondary | ICD-10-CM

## 2022-02-19 DIAGNOSIS — G4700 Insomnia, unspecified: Secondary | ICD-10-CM | POA: Diagnosis present

## 2022-02-19 DIAGNOSIS — Y832 Surgical operation with anastomosis, bypass or graft as the cause of abnormal reaction of the patient, or of later complication, without mention of misadventure at the time of the procedure: Secondary | ICD-10-CM | POA: Diagnosis present

## 2022-02-19 DIAGNOSIS — L8990 Pressure ulcer of unspecified site, unspecified stage: Secondary | ICD-10-CM | POA: Insufficient documentation

## 2022-02-19 DIAGNOSIS — Z9889 Other specified postprocedural states: Secondary | ICD-10-CM

## 2022-02-19 DIAGNOSIS — D638 Anemia in other chronic diseases classified elsewhere: Secondary | ICD-10-CM

## 2022-02-19 DIAGNOSIS — I10 Essential (primary) hypertension: Secondary | ICD-10-CM | POA: Diagnosis present

## 2022-02-19 DIAGNOSIS — Z20822 Contact with and (suspected) exposure to covid-19: Secondary | ICD-10-CM | POA: Diagnosis present

## 2022-02-19 DIAGNOSIS — Z515 Encounter for palliative care: Secondary | ICD-10-CM

## 2022-02-19 DIAGNOSIS — D72829 Elevated white blood cell count, unspecified: Secondary | ICD-10-CM | POA: Diagnosis present

## 2022-02-19 DIAGNOSIS — Z79890 Hormone replacement therapy (postmenopausal): Secondary | ICD-10-CM

## 2022-02-19 DIAGNOSIS — G8220 Paraplegia, unspecified: Secondary | ICD-10-CM | POA: Diagnosis present

## 2022-02-19 DIAGNOSIS — G9341 Metabolic encephalopathy: Secondary | ICD-10-CM

## 2022-02-19 DIAGNOSIS — E876 Hypokalemia: Secondary | ICD-10-CM

## 2022-02-19 DIAGNOSIS — Z800 Family history of malignant neoplasm of digestive organs: Secondary | ICD-10-CM

## 2022-02-19 DIAGNOSIS — L89612 Pressure ulcer of right heel, stage 2: Secondary | ICD-10-CM | POA: Diagnosis present

## 2022-02-19 DIAGNOSIS — Z758 Other problems related to medical facilities and other health care: Secondary | ICD-10-CM

## 2022-02-19 DIAGNOSIS — L89892 Pressure ulcer of other site, stage 2: Secondary | ICD-10-CM | POA: Diagnosis present

## 2022-02-19 DIAGNOSIS — N400 Enlarged prostate without lower urinary tract symptoms: Secondary | ICD-10-CM | POA: Diagnosis present

## 2022-02-19 DIAGNOSIS — Z5329 Procedure and treatment not carried out because of patient's decision for other reasons: Secondary | ICD-10-CM | POA: Diagnosis not present

## 2022-02-19 DIAGNOSIS — N319 Neuromuscular dysfunction of bladder, unspecified: Secondary | ICD-10-CM | POA: Diagnosis present

## 2022-02-19 DIAGNOSIS — Z79899 Other long term (current) drug therapy: Secondary | ICD-10-CM

## 2022-02-19 LAB — CBC WITH DIFFERENTIAL/PLATELET
Abs Immature Granulocytes: 0.17 10*3/uL — ABNORMAL HIGH (ref 0.00–0.07)
Basophils Absolute: 0 10*3/uL (ref 0.0–0.1)
Basophils Relative: 0 %
Eosinophils Absolute: 0 10*3/uL (ref 0.0–0.5)
Eosinophils Relative: 0 %
HCT: 27.6 % — ABNORMAL LOW (ref 39.0–52.0)
Hemoglobin: 9.1 g/dL — ABNORMAL LOW (ref 13.0–17.0)
Immature Granulocytes: 2 %
Lymphocytes Relative: 5 %
Lymphs Abs: 0.5 10*3/uL — ABNORMAL LOW (ref 0.7–4.0)
MCH: 31.9 pg (ref 26.0–34.0)
MCHC: 33 g/dL (ref 30.0–36.0)
MCV: 96.8 fL (ref 80.0–100.0)
Monocytes Absolute: 0.6 10*3/uL (ref 0.1–1.0)
Monocytes Relative: 5 %
Neutro Abs: 9.9 10*3/uL — ABNORMAL HIGH (ref 1.7–7.7)
Neutrophils Relative %: 88 %
Platelets: 236 10*3/uL (ref 150–400)
RBC: 2.85 MIL/uL — ABNORMAL LOW (ref 4.22–5.81)
RDW: 15.9 % — ABNORMAL HIGH (ref 11.5–15.5)
WBC: 11.2 10*3/uL — ABNORMAL HIGH (ref 4.0–10.5)
nRBC: 0 % (ref 0.0–0.2)

## 2022-02-19 LAB — COMPREHENSIVE METABOLIC PANEL
ALT: 48 U/L — ABNORMAL HIGH (ref 0–44)
AST: 54 U/L — ABNORMAL HIGH (ref 15–41)
Albumin: 2.4 g/dL — ABNORMAL LOW (ref 3.5–5.0)
Alkaline Phosphatase: 76 U/L (ref 38–126)
Anion gap: 8 (ref 5–15)
BUN: 36 mg/dL — ABNORMAL HIGH (ref 8–23)
CO2: 24 mmol/L (ref 22–32)
Calcium: 8.7 mg/dL — ABNORMAL LOW (ref 8.9–10.3)
Chloride: 101 mmol/L (ref 98–111)
Creatinine, Ser: 0.73 mg/dL (ref 0.61–1.24)
GFR, Estimated: >60 mL/min (ref >60)
Glucose, Bld: 121 mg/dL — ABNORMAL HIGH (ref 70–99)
Potassium: 3.8 mmol/L (ref 3.5–5.1)
Sodium: 133 mmol/L — ABNORMAL LOW (ref 135–145)
Total Bilirubin: 1.2 mg/dL (ref 0.3–1.2)
Total Protein: 5.9 g/dL — ABNORMAL LOW (ref 6.5–8.1)

## 2022-02-19 LAB — LACTIC ACID, PLASMA: Lactic Acid, Venous: 1.1 mmol/L (ref 0.5–1.9)

## 2022-02-19 IMAGING — CT CT HEAD W/O CM
3 series · 15 of 47 positions shown, 18 images · non-contrast
Comparison: None Available.

CLINICAL DATA: Mental status change, unknown cause



[Series 3: head 5.0 h30s · axial · 0.41mm/px · z∈[+1261,+1391]mm · 9 of 32 slices shown, 12 images]
[im 3/32  brain]
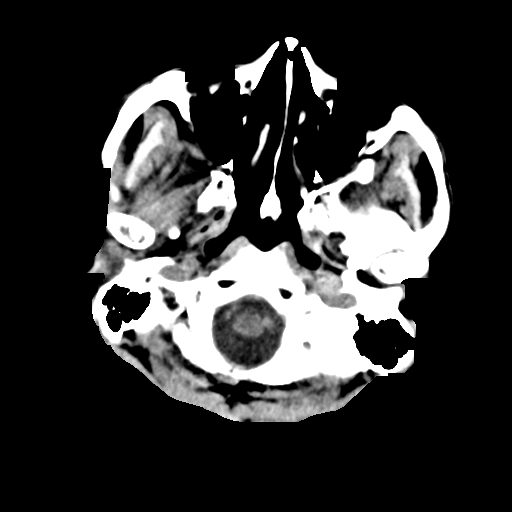
[im 3/32  bone]
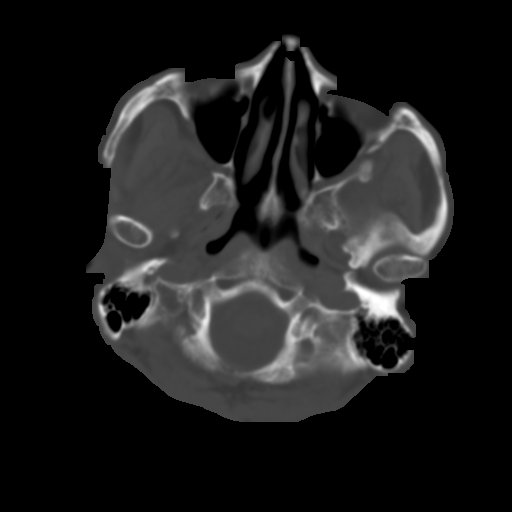
[im 6/32  brain]
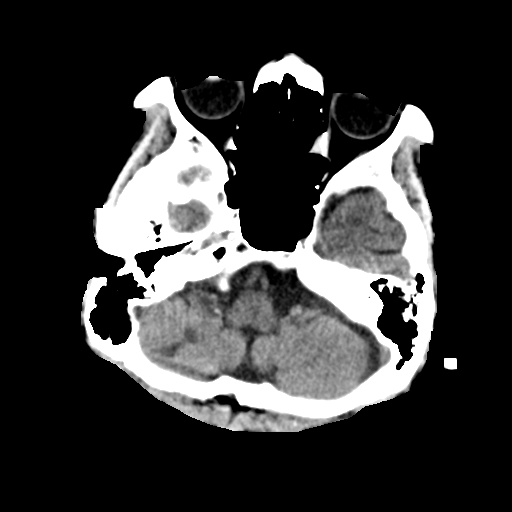
[im 9/32  brain]
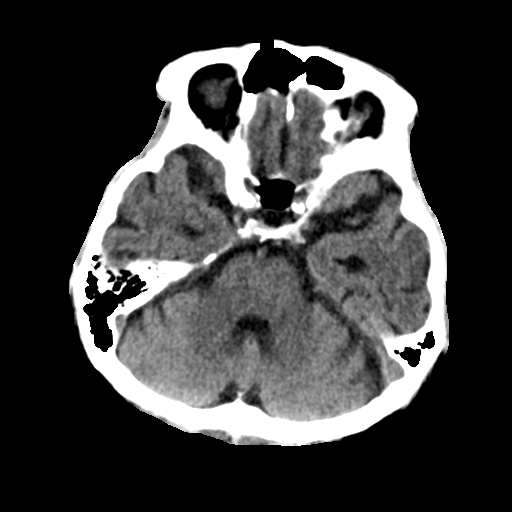
[im 12/32  brain]
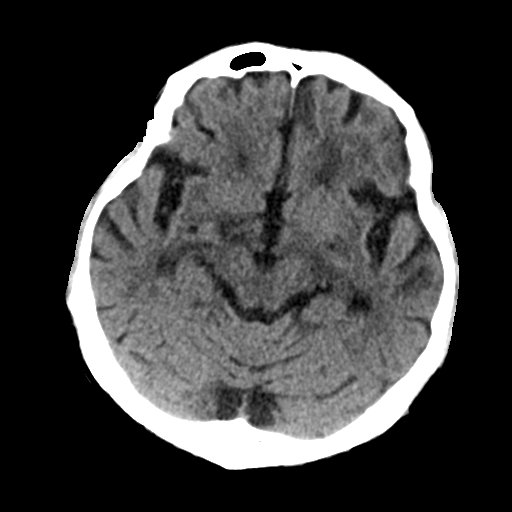
[im 17/32  brain]
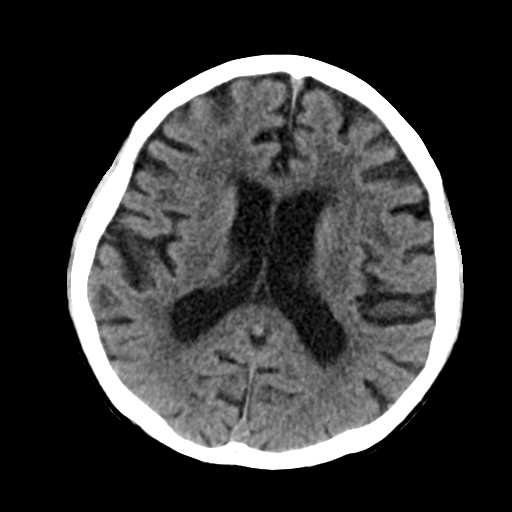
[im 17/32  bone]
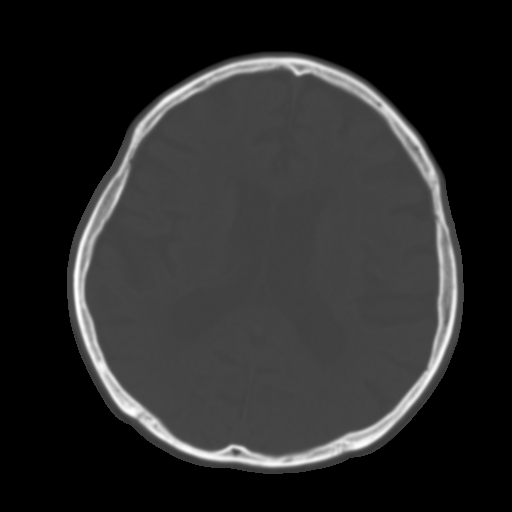
[im 20/32  brain]
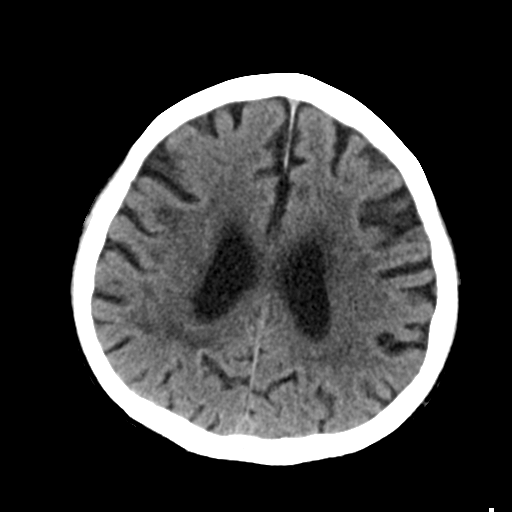
[im 23/32  brain]
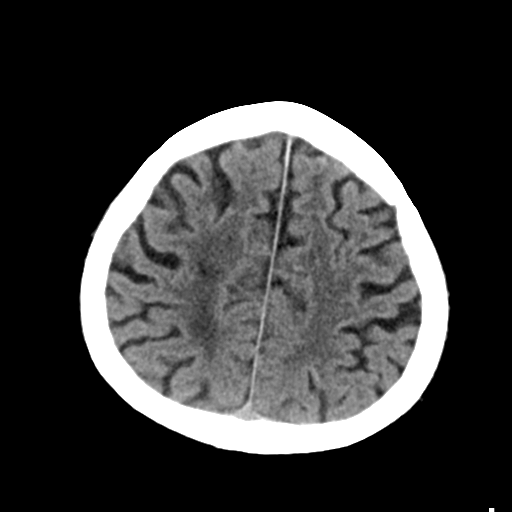
[im 26/32  brain]
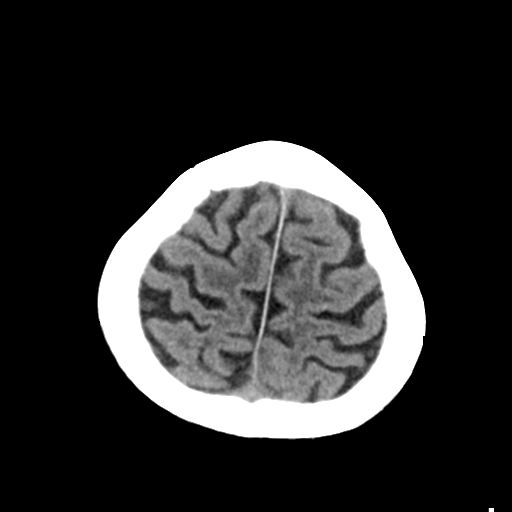
[im 29/32  brain]
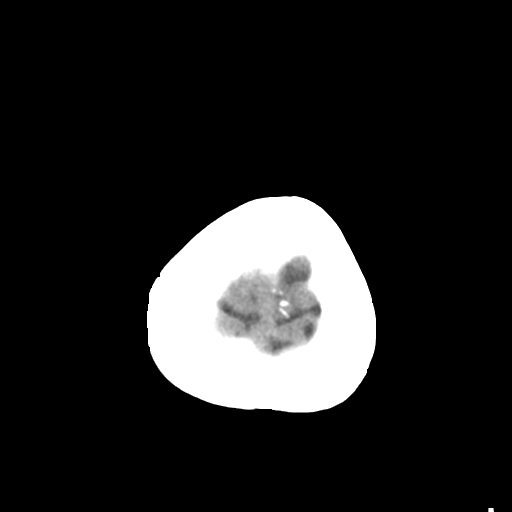
[im 29/32  bone]
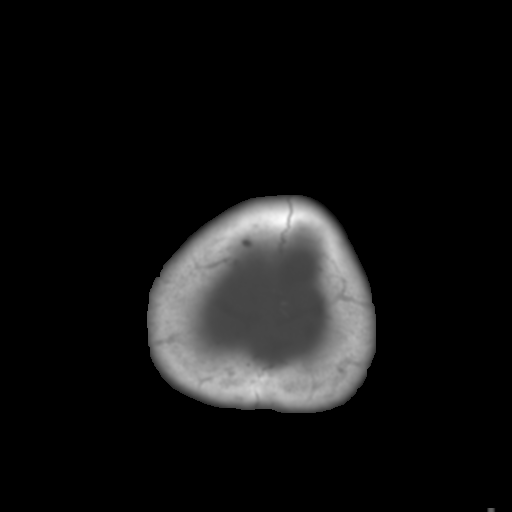

[Series 5: head 3.0 mpr cor · coronal · 0.31mm/px · 3 of 67 slices shown]
[im 23/67  brain]
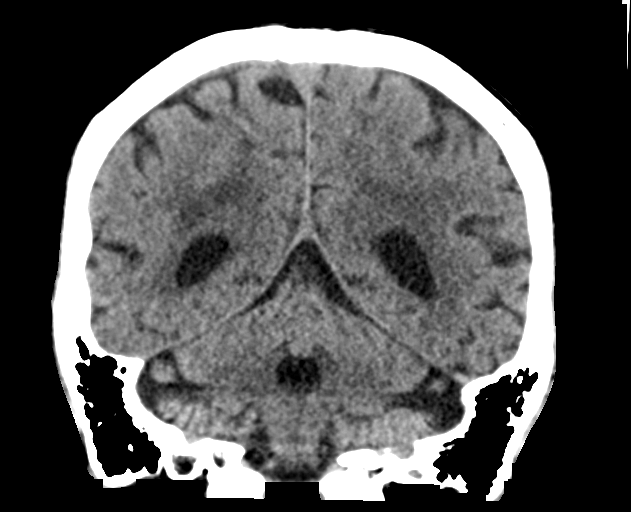
[im 30/67  brain]
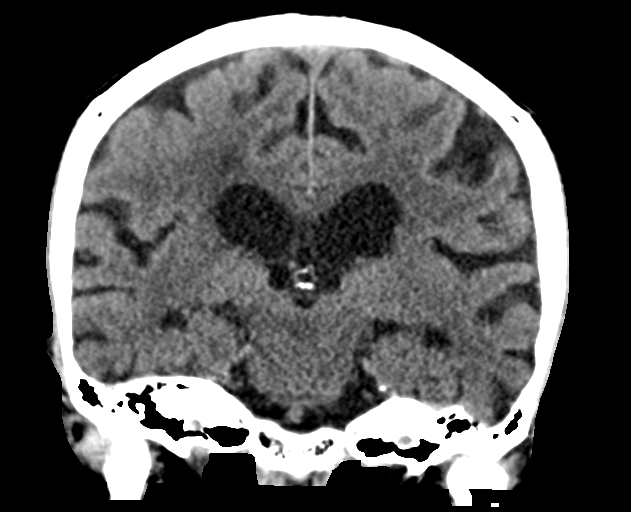
[im 37/67  brain]
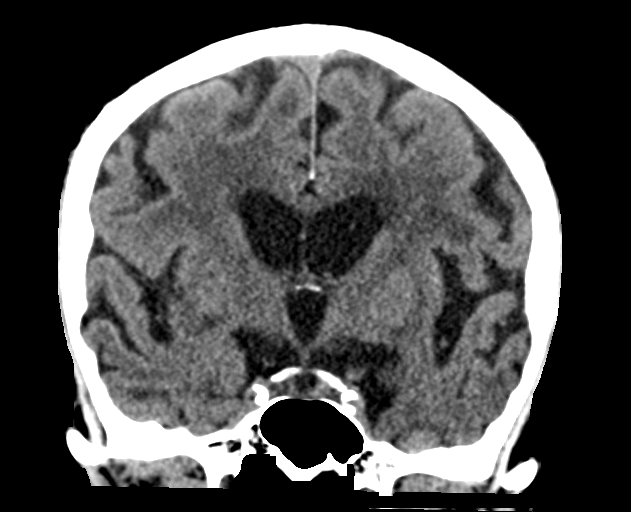

[Series 6: head 3.0 mpr sag · sagittal · 0.31mm/px · 3 of 63 slices shown]
[im 21/63  brain]
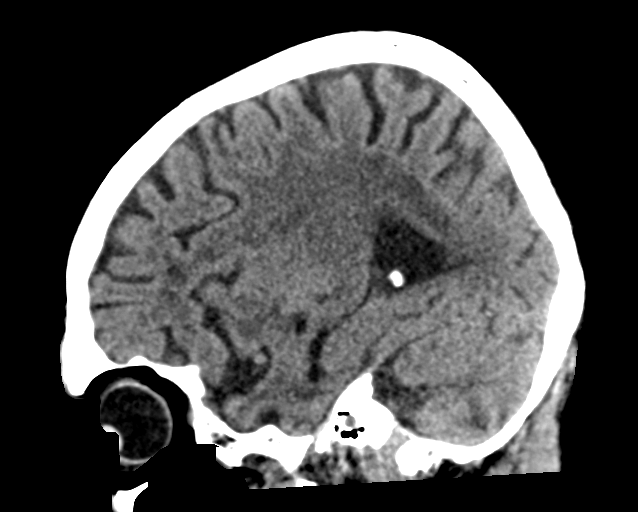
[im 32/63  brain]
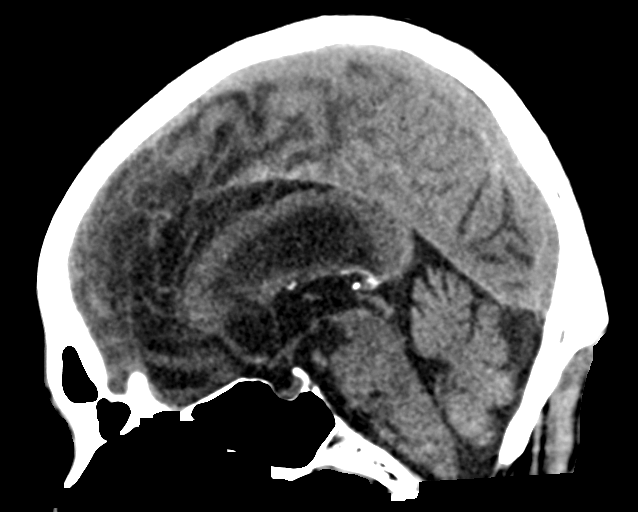
[im 42/63  brain]
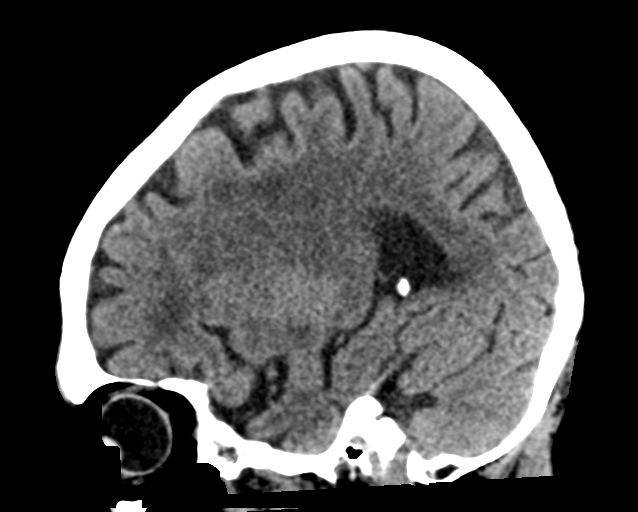

[15 of 47 positions shown; findings below may reference images not displayed]

FINDINGS: Brain: Age related atrophy. There is moderate to advanced
periventricular and deep white matter hypodensity typical of chronic
small vessel ischemia. Small remote lacunar infarcts in the right
cerebellum. There is no evidence of acute ischemia. No hemorrhage.
No subdural or extra-axial collection. No midline shift or mass
effect.

Vascular: Atherosclerosis of skullbase vasculature without
hyperdense vessel or abnormal calcification.

Skull: No fracture or focal lesion.

Sinuses/Orbits: Opacification of a single ethmoid air cell.
Otherwise clear.

Other: None.
IMPRESSION: 1. No acute intracranial abnormality.
2. Age related atrophy with moderate to advanced chronic small
vessel ischemia. Small remote lacunar infarcts in the right
cerebellum.

## 2022-02-19 IMAGING — DX DG CHEST 2V
2 series · 2 of 2 positions shown · non-contrast
Comparison: None Available.

CLINICAL DATA: Altered mental status.

EXAM:
CHEST - 2 VIEW

[chest lat]
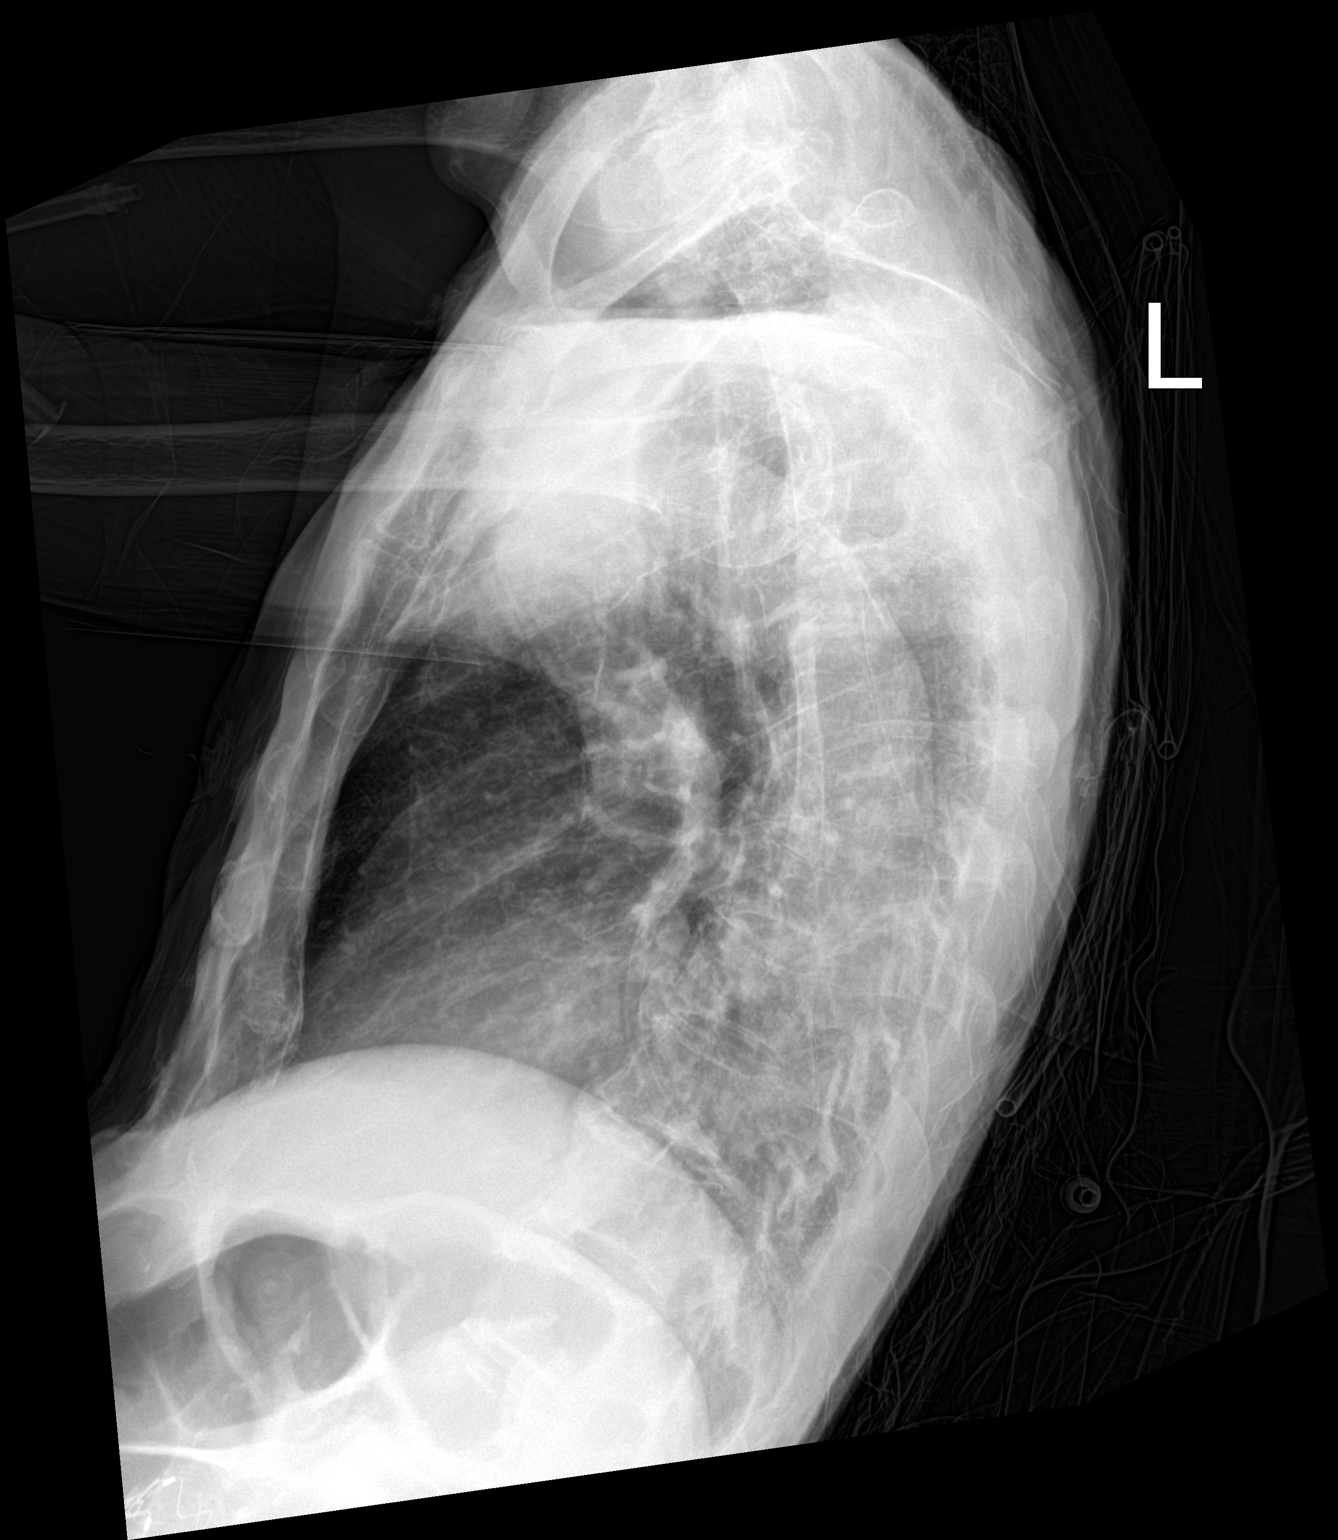

[chest ap]
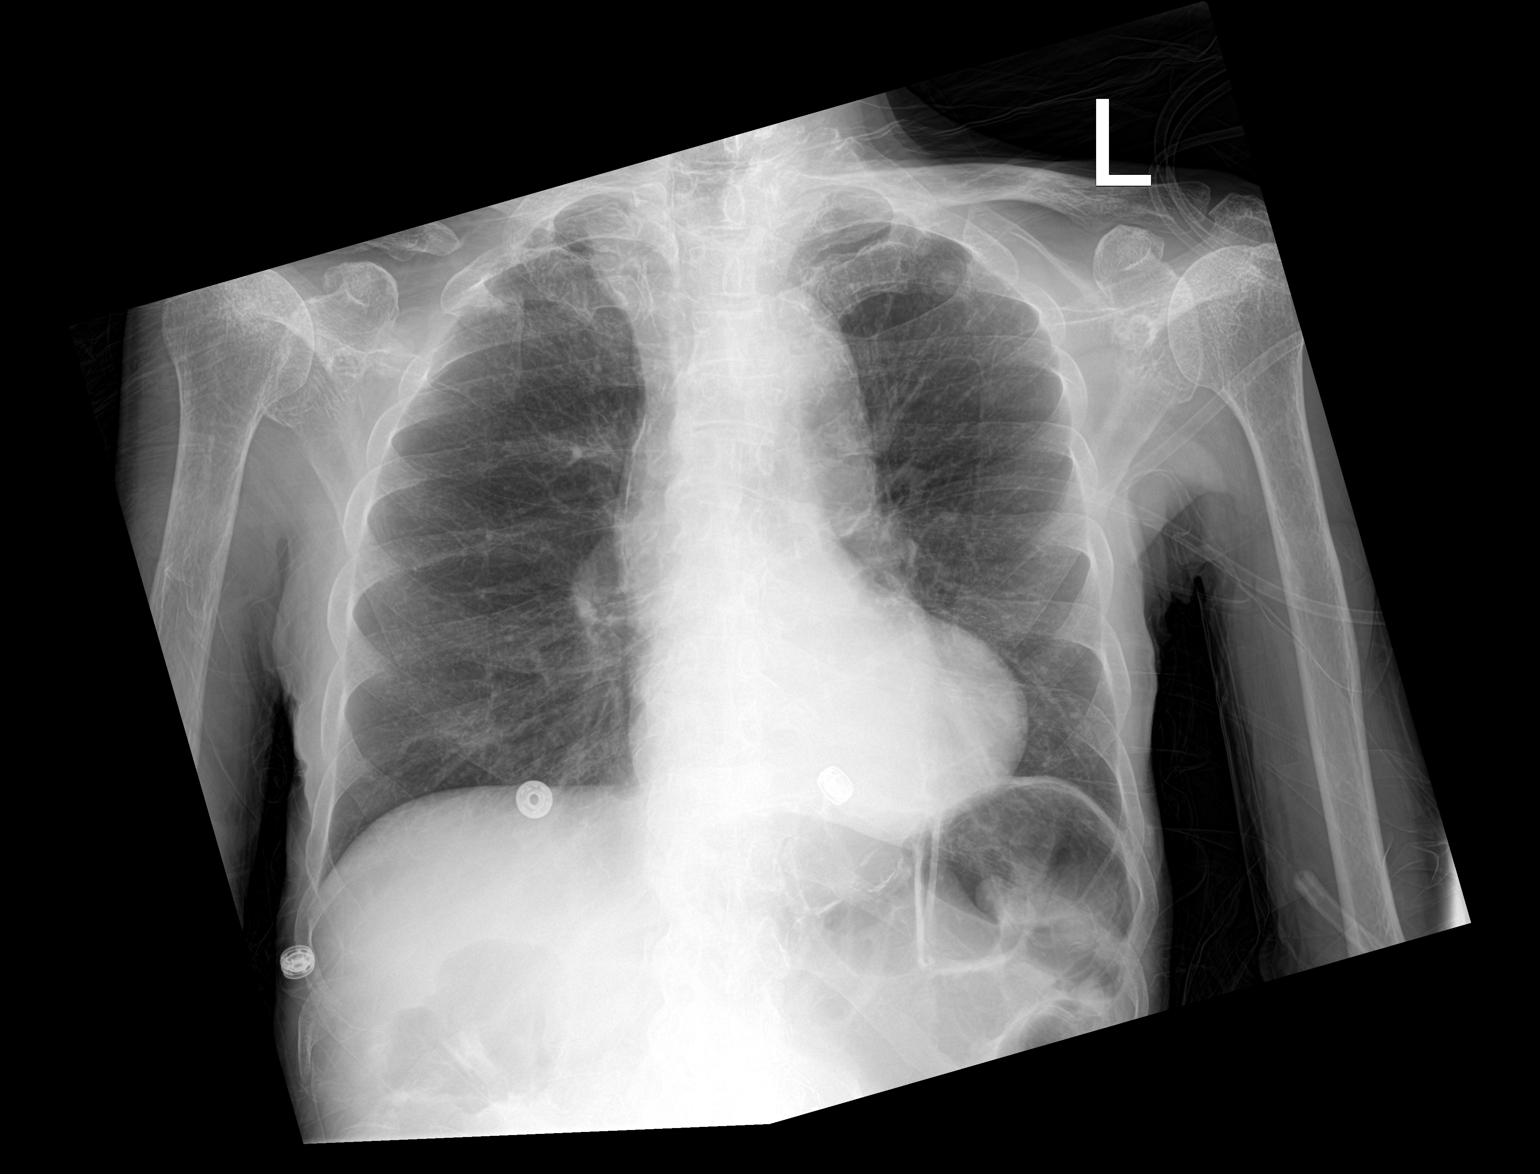

[2 of 2 positions shown; findings below may reference images not displayed]

FINDINGS: No focal consolidation, pleural effusion, pneumothorax the cardiac
silhouette is within limits. No acute osseous pathology. Osteopenia
with degenerative changes of the spine. Partially visualized
endovascular stent graft of the abdominal aorta.
IMPRESSION: No active cardiopulmonary disease.

## 2022-02-19 MED ORDER — SODIUM CHLORIDE 0.9 % IV BOLUS
1000.0000 mL | Freq: Once | INTRAVENOUS | Status: AC
  Administered 2022-02-19: 1000 mL via INTRAVENOUS

## 2022-02-19 NOTE — ED Triage Notes (Signed)
Pt is a poor historian, family enroute. Per EMS pt arrived from [facility] per family request d/t failure to thrive since arrival at [facility] status post stroke

## 2022-02-19 NOTE — ED Provider Notes (Signed)
[facility] EMERGENCY DEPARTMENT Provider Note   CSN: [id number] Arrival date & time: 02/19/22  2054     History  Chief Complaint  Patient presents with   Failure To Thrive    Allen Arias is a 81 y.o. male.  Pt is a 81 yo male with a pmhx significant for BPH, HLD and AAA.  He had an enlarging AAA and was admitted on 4/21 for TEVAR, right-CFA endarterectomy and LLE thrombectomy by Dr. Randie Heinz.  Post-op, it was found that he could not move his BLE.  He was found to have a spinal cord CVA.  He also developed anemia and had to receive blood.  He had urinary retention and bowel incontinence.  Pt was admitted to inpatient rehab from 4/26 to 5/22.  After d/c, he was sent to a SNF.  He did see his PM&R physician (Dr. Berline Chough) on 6/7.  She thought he was developing neuropathic pain after the spinal cord injury.  She added gabapentin and duloxetine.  She also recommended increasing Remeron to 15 mg every night.  Pt's son said he has been sleeping a lot and has not had any appetite.  He said he was told his bp was elevated and his oxygen sat was low.  Pt said he just does not feel hungry.  Due to language barrier, an interpreter was present during the history-taking and subsequent discussion (and for part of the physical exam) with this patient. ([place])        Home Medications Prior to Admission medications   Medication Sig Start Date End Date Taking? Authorizing Provider  acetaminophen (TYLENOL) 325 MG tablet Take 2 tablets (650 mg total) by mouth 3 (three) times daily. 01/19/22   Love, Evlyn Kanner, PA-C  Ascorbic Acid (VITAMIN C PO) Take 1 capsule by mouth daily.    [provider]  bisacodyl (DULCOLAX) 10 MG suppository Place 1 suppository (10 mg total) rectally every other day. After supper 01/19/22   Love, Evlyn Kanner, PA-C  butalbital-acetaminophen-caffeine (FIORICET) [phone] MG tablet Take 1 tablet by mouth daily as needed for headache (for headache). 01/26/22   Love,  Evlyn Kanner, PA-C  cholecalciferol (VITAMIN D) 25 MCG tablet Take 1 tablet (1,000 Units total) by mouth daily. 01/20/22   Love, Evlyn Kanner, PA-C  citalopram (CELEXA) 20 MG tablet Take 1 tablet (20 mg total) by mouth daily. 01/20/22   Love, Evlyn Kanner, PA-C  dutasteride (AVODART) 0.5 MG capsule Take 1 capsule (0.5 mg total) by mouth daily after supper. 01/26/22   Love, Evlyn Kanner, PA-C  enoxaparin (LOVENOX) 40 MG/0.4ML injection Inject 0.4 mLs (40 mg total) into the skin daily. 01/20/22   Love, Evlyn Kanner, PA-C  feeding supplement (ENSURE ENLIVE / ENSURE PLUS) LIQD Take 237 mLs by mouth with breakfast, with lunch, and with evening meal. 01/19/22   Love, Evlyn Kanner, PA-C  ferrous gluconate (FERGON) 324 MG tablet Take 324 mg by mouth daily with breakfast.    [provider]  melatonin 3 MG TABS tablet Take 1 tablet (3 mg total) by mouth at bedtime. 01/19/22   Love, Evlyn Kanner, PA-C  mirtazapine (REMERON) 7.5 MG tablet Take 7.5 mg by mouth at bedtime. 02/06/22   [provider]  naproxen (NAPROSYN) 250 MG tablet Take 250 mg by mouth 2 (two) times daily. 02/10/22   [provider]  Pediatric Multiple Vitamins (CHILDRENS MULTIVITAMIN) chewable tablet Chew 1 tablet by mouth 2 (two) times daily. 01/19/22   Love, Evlyn Kanner, PA-C  polycarbophil (  FIBERCON) 625 MG tablet Take 1 tablet (625 mg total) by mouth 2 (two) times daily. 01/19/22   Love, Evlyn Kanner, PA-C  polyethylene glycol (MIRALAX / GLYCOLAX) 17 g packet Take 17 g by mouth daily as needed for mild constipation. 01/19/22   Love, Evlyn Kanner, PA-C  simvastatin (ZOCOR) 20 MG tablet Take 20 mg by mouth at bedtime. 07/18/19   [provider]  topiramate (TOPAMAX) 25 MG tablet Take 1 tablet (25 mg total) by mouth at bedtime. 01/26/22   Love, Evlyn Kanner, PA-C  zolpidem (AMBIEN) 5 MG tablet Take 1 tablet (5 mg total) by mouth at bedtime. 01/26/22   Jacquelynn Cree, PA-C      Allergies    Patient has no active allergies.    Review of Systems   Review of  Systems  Neurological:  Positive for weakness.  All other systems reviewed and are negative.   Physical Exam Updated Vital Signs BP (!) 138/95 (BP Location: Left Arm)   Pulse 98   Temp (!) 97.1 F (36.2 C) (Oral)   Resp 20   SpO2 93%  Physical Exam Vitals and nursing note reviewed.  Constitutional:      Appearance: Normal appearance.  HENT:     Head: Normocephalic and atraumatic.     Right Ear: External ear normal.     Left Ear: External ear normal.     Nose: Nose normal.     Mouth/Throat:     Mouth: Mucous membranes are dry.  Eyes:     Extraocular Movements: Extraocular movements intact.     Conjunctiva/sclera: Conjunctivae normal.     Pupils: Pupils are equal, round, and reactive to light.  Cardiovascular:     Rate and Rhythm: Normal rate and regular rhythm.     Pulses: Normal pulses.     Heart sounds: Normal heart sounds.  Pulmonary:     Effort: Pulmonary effort is normal.     Breath sounds: Normal breath sounds.  Abdominal:     General: Abdomen is flat. Bowel sounds are normal.     Palpations: Abdomen is soft.  Musculoskeletal:     Cervical back: Normal range of motion and neck supple.     Right lower leg: Edema present.     Left lower leg: No edema.  Skin:    General: Skin is warm and dry.     Capillary Refill: Capillary refill takes less than 2 seconds.  Neurological:     Mental Status: He is alert and oriented to person, place, and time.     Comments: BLE weakness (chronic)  Psychiatric:        Mood and Affect: Mood normal.        Behavior: Behavior normal.     ED Results / Procedures / Treatments   Labs (all labs ordered are listed, but only abnormal results are displayed) Labs Reviewed  COMPREHENSIVE METABOLIC PANEL - Abnormal; Notable for the following components:      Result Value   Sodium 133 (*)    Glucose, Bld 121 (*)    BUN 36 (*)    Calcium 8.7 (*)    Total Protein 5.9 (*)    Albumin 2.4 (*)    AST 54 (*)    ALT 48 (*)    All other  components within normal limits  CBC WITH DIFFERENTIAL/PLATELET - Abnormal; Notable for the following components:   WBC 11.2 (*)    RBC 2.85 (*)    Hemoglobin 9.1 (*)  HCT 27.6 (*)    RDW 15.9 (*)    Neutro Abs 9.9 (*)    Lymphs Abs 0.5 (*)    Abs Immature Granulocytes 0.17 (*)    All other components within normal limits  RESP PANEL BY RT-PCR (FLU A&B, COVID) ARPGX2  LACTIC ACID, PLASMA  CBC WITH DIFFERENTIAL/PLATELET  URINALYSIS, ROUTINE W REFLEX MICROSCOPIC  CBG MONITORING, ED    EKG None  Radiology CT Head Wo Contrast  Result Date: 02/19/2022 CLINICAL DATA:  Mental status change, unknown cause EXAM: CT HEAD WITHOUT CONTRAST TECHNIQUE: Contiguous axial images were obtained from the base of the skull through the vertex without intravenous contrast. RADIATION DOSE REDUCTION: This exam was performed according to the departmental dose-optimization program which includes automated exposure control, adjustment of the mA and/or kV according to patient size and/or use of iterative reconstruction technique. COMPARISON:  None Available. FINDINGS: Brain: Age related atrophy. There is moderate to advanced periventricular and deep white matter hypodensity typical of chronic small vessel ischemia. 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 There is no evidence of acute ischemia. No hemorrhage. No subdural or extra-axial collection. No midline shift or mass effect. Vascular: Atherosclerosis of skullbase vasculature without hyperdense vessel or abnormal calcification. Skull: No fracture or focal lesion. Sinuses/Orbits: Opacification of a single ethmoid air cell. Otherwise clear. Other: None. IMPRESSION: 1. No acute intracranial abnormality. 2. 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 Electronically Signed   By: Narda Rutherford M.D.   On: 02/19/2022 23:31   DG Chest 2 View  Result Date:  02/19/2022 CLINICAL DATA:  Altered mental status. EXAM: CHEST - 2 VIEW COMPARISON:  None Available. FINDINGS: No focal consolidation, pleural effusion, pneumothorax the cardiac silhouette is within limits. No acute osseous pathology. Osteopenia with degenerative changes of the spine. Partially visualized endovascular stent graft of the abdominal aorta. IMPRESSION: No active cardiopulmonary disease. Electronically Signed   By: Elgie Collard M.D.   On: 02/19/2022 22:28    Procedures Procedures    Medications Ordered in ED Medications  sodium chloride 0.9 % bolus 1,000 mL (1,000 mLs Intravenous New Bag/Given 02/19/22 2241)    ED Course/ Medical Decision Making/ A&P                           Medical Decision Making Amount and/or Complexity of Data Reviewed Labs: ordered. Radiology: ordered.   This patient presents to the ED for concern of weakness, this involves an extensive number of treatment options, and is a complaint that carries with it a high risk of complications and morbidity.  The differential diagnosis includes infection, electrolyte abn   Co morbidities that complicate the patient evaluation  BPH, HLD and AAA. Spinal cord CVA   Additional history obtained:  Additional history obtained from epic chart review External records from outside source obtained and reviewed including EMS report/son   Lab Tests:  I Ordered, and personally interpreted labs.  The pertinent results include:  cbc with chronic anemia hgb 9.1 ( hgb 9.1 on 5/22); cmp with mild bump in lfts.  UA pending.   Imaging Studies ordered:  I ordered imaging studies including cxr and CT abd/pelvis I independently visualized and interpreted imaging which showed  IMPRESSION:  No active cardiopulmonary disease.       I agree with the radiologist interpretation   Cardiac Monitoring:  The patient was maintained on a cardiac monitor.  I personally viewed and interpreted the cardiac monitored which  showed an underlying rhythm of: nsr   Medicines ordered and prescription drug management:  I ordered medication including IVFs  for dehydration  Reevaluation of the patient after these medicines showed that the patient improved I have reviewed the patients home medicines and have made adjustments as needed   Test Considered:  ct   Critical Interventions:  ivfs    Problem List / ED Course:  Failure to thrive:  pt given IVFs Elevated LFTs:  Pt did have a gb US while admitted and he did have some gall stones.   I am going to add on a CT abd/pelvis   Reevaluation:  After the interventions noted above, I reevaluated the patient and found that they have :improved   Social Determinants of Health:  Lives in a SNF   Dispostion:  Pending CT and urine.  Pt signed out to Dr. Adela Lank.        Final Clinical Impression(s) / ED Diagnoses Final diagnoses:  Dehydration  Failure to thrive in adult    Rx / DC Orders ED Discharge Orders     None         Jacalyn Lefevre, MD 02/19/22 2348

## 2022-02-19 NOTE — ED Provider Notes (Signed)
I received the patient in signout from Dr. Particia Nearing, briefly the patient is a 81 year old male with a chief complaints of confusion.  Patient has unfortunately been recently placed in a nursing home after having a procedure for AAA resulting in spinal cord infarct.  He has had a history of urinary tract infections in the past.  Plan to await for UA and CT scan of the abdomen pelvis.  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  I did discuss this with the radiologist.  Will start on broad-spectrum antibiotics.  Will discuss with vascular. Discussed with Dr. Sherral Hammers, vascular recommended broad-spectrum antibiotics.  We will see in a few hours.  Recommended medical admission.  He anticipates that he will need to discussed risk and benefits with the patient and if he agrees we will need a multiple disciplinary approach with likely general surgery and perhaps GI.     CRITICAL CARE Performed by: Rae Roam   Total critical care time: 35 minutes  Critical care time was exclusive of separately billable procedures and treating other patients.  Critical care was necessary to treat or prevent imminent or life-threatening deterioration.  Critical care was time spent personally by me on the following activities: development of treatment plan with patient and/or surrogate as well as nursing, discussions with consultants, evaluation of patient's response to treatment, examination of patient, obtaining history from patient or surrogate, ordering and performing treatments and interventions, ordering and review of laboratory studies, ordering and review of radiographic studies, pulse oximetry and re-evaluation of patient's condition.    Melene Plan, DO 02/20/22 0200

## 2022-02-20 ENCOUNTER — Emergency Department (HOSPITAL_COMMUNITY): Payer: Medicare Other

## 2022-02-20 ENCOUNTER — Encounter (HOSPITAL_COMMUNITY): Payer: Self-pay | Admitting: Family Medicine

## 2022-02-20 DIAGNOSIS — I10 Essential (primary) hypertension: Secondary | ICD-10-CM

## 2022-02-20 DIAGNOSIS — T827XXA Infection and inflammatory reaction due to other cardiac and vascular devices, implants and grafts, initial encounter: Secondary | ICD-10-CM

## 2022-02-20 DIAGNOSIS — Z79890 Hormone replacement therapy (postmenopausal): Secondary | ICD-10-CM | POA: Diagnosis not present

## 2022-02-20 DIAGNOSIS — Z7189 Other specified counseling: Secondary | ICD-10-CM | POA: Diagnosis not present

## 2022-02-20 DIAGNOSIS — E43 Unspecified severe protein-calorie malnutrition: Secondary | ICD-10-CM | POA: Diagnosis present

## 2022-02-20 DIAGNOSIS — Z20822 Contact with and (suspected) exposure to covid-19: Secondary | ICD-10-CM | POA: Diagnosis present

## 2022-02-20 DIAGNOSIS — E876 Hypokalemia: Secondary | ICD-10-CM | POA: Diagnosis present

## 2022-02-20 DIAGNOSIS — Z8679 Personal history of other diseases of the circulatory system: Secondary | ICD-10-CM | POA: Diagnosis not present

## 2022-02-20 DIAGNOSIS — R627 Adult failure to thrive: Secondary | ICD-10-CM | POA: Diagnosis present

## 2022-02-20 DIAGNOSIS — N400 Enlarged prostate without lower urinary tract symptoms: Secondary | ICD-10-CM | POA: Diagnosis present

## 2022-02-20 DIAGNOSIS — E871 Hypo-osmolality and hyponatremia: Secondary | ICD-10-CM

## 2022-02-20 DIAGNOSIS — L89109 Pressure ulcer of unspecified part of back, unspecified stage: Secondary | ICD-10-CM | POA: Diagnosis present

## 2022-02-20 DIAGNOSIS — Y832 Surgical operation with anastomosis, bypass or graft as the cause of abnormal reaction of the patient, or of later complication, without mention of misadventure at the time of the procedure: Secondary | ICD-10-CM | POA: Diagnosis present

## 2022-02-20 DIAGNOSIS — E860 Dehydration: Secondary | ICD-10-CM | POA: Diagnosis present

## 2022-02-20 DIAGNOSIS — Z681 Body mass index (BMI) 19 or less, adult: Secondary | ICD-10-CM | POA: Diagnosis not present

## 2022-02-20 DIAGNOSIS — R54 Age-related physical debility: Secondary | ICD-10-CM | POA: Diagnosis present

## 2022-02-20 DIAGNOSIS — G8220 Paraplegia, unspecified: Secondary | ICD-10-CM | POA: Diagnosis present

## 2022-02-20 DIAGNOSIS — N319 Neuromuscular dysfunction of bladder, unspecified: Secondary | ICD-10-CM | POA: Diagnosis present

## 2022-02-20 DIAGNOSIS — I714 Abdominal aortic aneurysm, without rupture, unspecified: Secondary | ICD-10-CM | POA: Diagnosis not present

## 2022-02-20 DIAGNOSIS — D72829 Elevated white blood cell count, unspecified: Secondary | ICD-10-CM | POA: Diagnosis present

## 2022-02-20 DIAGNOSIS — D638 Anemia in other chronic diseases classified elsewhere: Secondary | ICD-10-CM | POA: Diagnosis present

## 2022-02-20 DIAGNOSIS — R748 Abnormal levels of other serum enzymes: Secondary | ICD-10-CM | POA: Diagnosis present

## 2022-02-20 DIAGNOSIS — K632 Fistula of intestine: Secondary | ICD-10-CM | POA: Diagnosis present

## 2022-02-20 DIAGNOSIS — G9341 Metabolic encephalopathy: Secondary | ICD-10-CM | POA: Diagnosis present

## 2022-02-20 DIAGNOSIS — Z66 Do not resuscitate: Secondary | ICD-10-CM | POA: Diagnosis not present

## 2022-02-20 DIAGNOSIS — Z515 Encounter for palliative care: Secondary | ICD-10-CM | POA: Diagnosis not present

## 2022-02-20 DIAGNOSIS — Z800 Family history of malignant neoplasm of digestive organs: Secondary | ICD-10-CM | POA: Diagnosis not present

## 2022-02-20 LAB — URINALYSIS, ROUTINE W REFLEX MICROSCOPIC
Bilirubin Urine: NEGATIVE
Glucose, UA: NEGATIVE mg/dL
Ketones, ur: NEGATIVE mg/dL
Nitrite: POSITIVE — AB
Protein, ur: NEGATIVE mg/dL
Specific Gravity, Urine: 1.034 — ABNORMAL HIGH (ref 1.005–1.030)
pH: 5 (ref 5.0–8.0)

## 2022-02-20 LAB — RESP PANEL BY RT-PCR (FLU A&B, COVID) ARPGX2
Influenza A by PCR: NEGATIVE
Influenza B by PCR: NEGATIVE
SARS Coronavirus 2 by RT PCR: NEGATIVE

## 2022-02-20 LAB — POC OCCULT BLOOD, ED: Fecal Occult Bld: POSITIVE — AB

## 2022-02-20 MED ORDER — SENNOSIDES-DOCUSATE SODIUM 8.6-50 MG PO TABS: 1.0000 | ORAL_TABLET | Freq: Every evening | ORAL | Status: DC | PRN

## 2022-02-20 MED ORDER — ACETAMINOPHEN 650 MG RE SUPP: 650.0000 mg | Freq: Four times a day (QID) | RECTAL | Status: DC | PRN

## 2022-02-20 MED ORDER — SODIUM CHLORIDE 0.9 % IV SOLN: INTRAVENOUS | Status: AC

## 2022-02-20 MED ORDER — PIPERACILLIN-TAZOBACTAM 3.375 G IVPB
3.3750 g | Freq: Three times a day (TID) | INTRAVENOUS | Status: DC
  Administered 2022-02-20 – 2022-02-21 (×3): 3.375 g via INTRAVENOUS
  Filled 2022-02-20 (×3): qty 50

## 2022-02-20 MED ORDER — VANCOMYCIN HCL IN DEXTROSE 1-5 GM/200ML-% IV SOLN
1000.0000 mg | Freq: Once | INTRAVENOUS | Status: AC
  Administered 2022-02-20: 1000 mg via INTRAVENOUS
  Filled 2022-02-20: qty 200

## 2022-02-20 MED ORDER — IOHEXOL 300 MG/ML  SOLN
80.0000 mL | Freq: Once | INTRAMUSCULAR | Status: AC | PRN
  Administered 2022-02-20: 80 mL via INTRAVENOUS

## 2022-02-20 MED ORDER — VANCOMYCIN HCL IN DEXTROSE 1-5 GM/200ML-% IV SOLN
1000.0000 mg | INTRAVENOUS | Status: DC
  Administered 2022-02-20 – 2022-02-22 (×3): 1000 mg via INTRAVENOUS
  Filled 2022-02-20 (×5): qty 200

## 2022-02-20 MED ORDER — MELATONIN 3 MG PO TABS
3.0000 mg | ORAL_TABLET | Freq: Every evening | ORAL | Status: DC | PRN
  Filled 2022-02-20: qty 1

## 2022-02-20 MED ORDER — FENTANYL CITRATE PF 50 MCG/ML IJ SOSY
50.0000 ug | PREFILLED_SYRINGE | INTRAMUSCULAR | Status: DC | PRN
  Administered 2022-02-22: 50 ug via INTRAVENOUS
  Filled 2022-02-20: qty 1

## 2022-02-20 MED ORDER — ZOLPIDEM TARTRATE 5 MG PO TABS
5.0000 mg | ORAL_TABLET | Freq: Every evening | ORAL | Status: DC | PRN
  Administered 2022-02-20 – 2022-02-22 (×3): 5 mg via ORAL
  Filled 2022-02-20 (×3): qty 1

## 2022-02-20 MED ORDER — PIPERACILLIN-TAZOBACTAM 3.375 G IVPB 30 MIN
3.3750 g | Freq: Once | INTRAVENOUS | Status: AC
  Administered 2022-02-20: 3.375 g via INTRAVENOUS
  Filled 2022-02-20: qty 50

## 2022-02-20 MED ORDER — ENSURE ENLIVE PO LIQD
237.0000 mL | Freq: Two times a day (BID) | ORAL | Status: DC
  Administered 2022-02-20 – 2022-02-23 (×6): 237 mL via ORAL
  Filled 2022-02-20: qty 237

## 2022-02-20 MED ORDER — NALOXONE HCL 0.4 MG/ML IJ SOLN: 0.4000 mg | INTRAMUSCULAR | Status: DC | PRN

## 2022-02-20 MED ORDER — ACETAMINOPHEN 325 MG PO TABS
650.0000 mg | ORAL_TABLET | Freq: Four times a day (QID) | ORAL | Status: DC | PRN
  Administered 2022-02-21: 650 mg via ORAL
  Filled 2022-02-20 (×2): qty 2

## 2022-02-20 NOTE — Care Plan (Signed)
This 81 y.o. Male with medical history significant for hypertension, BPH, and AAA status post endovascular repair in April 2023 complicated by spinal cord infarction and bilateral lower extremity paraplegia, presented in ED with progressive somnolence and decreased appetite from SNF. 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  Patient was initiated on IV antibiotics (vancomycin and Zosyn and was also given IV fluids).  Vascular surgery was consulted, not a candidate for any surgical intervention.  Vascular surgery recommended palliative care consult to discuss goals of care.  Patient was seen and examined at bedside.  appears comfortable.

## 2022-02-20 NOTE — Progress Notes (Signed)
    I have reviewed the CT scan and discussed with Dr. Karin Lieu.  I also discussed the case with the patient's daughter who was at home and the son-in-law who is at the bedside and we reviewed the CT together.  Patient's son-in-law wondering what other options he has besides surgery and I have discussed palliative care.  He is also inquiring about transfer to a different hospital and though the outcome would not be different this is certainly an option to gain a second opinion.  Unfortunately he appears extremely deconditioned from previous evaluation and is in no shape to undergo any operation at this time.  He needs palliative goals of care discussion and we will be available to discuss as needed.  Allen Arias C. Randie Heinz, MD

## 2022-02-20 NOTE — Consult Note (Signed)
Hospital Consult    Reason for Consult: Aortic graft infection Requesting Physician: Dr. Melene Plan MRN #:  [id number]  History of Present Illness: This is a 81 y.o. male well-known to the vascular surgery service having undergone aortic endovascular repair several months ago.  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  He has been living in assisted living due to the paralysis and has had significant weight loss, presenting to the hospital with failure to thrive.  I was called regarding recent CT scan demonstrating air within the aortic aneurysm sac.    On exam, Allen Arias appeared emaciated.  A [place] interpreter was used.  He denied abdominal pain, back pain, chest pain.  Denied recent bloody bowel movement, denied hematemesis  Past Medical History:  Diagnosis Date   AAA (abdominal aortic aneurysm) (HCC)    BPH (benign prostatic hyperplasia)    Elevated lipids    Hypertension    Nocturia    UTI (urinary tract infection) 01/19/2022    Past Surgical History:  Procedure Laterality Date   ABDOMINAL AORTIC ENDOVASCULAR STENT GRAFT N/A 12/26/2021   Procedure: ABDOMINAL AORTIC ENDOVASCULAR STENT GRAFT;  Surgeon: Maeola Harman, MD;  Location: [facility] OR;  Service: Vascular;  Laterality: N/A;   CARPAL TUNNEL RELEASE     ENDARTERECTOMY FEMORAL Right 12/26/2021   Procedure: RIGHT ENDARTERECTOMY FEMORAL;  Surgeon: Maeola Harman, MD;  Location: [facility] OR;  Service: Vascular;  Laterality: Right;   ENDARTERECTOMY POPLITEAL Left 12/26/2021   Procedure: LEFT ENDARTERECTOMY POPLITEAL WITH ANGIOPLASTY USING XENOSURE BIOPATCH (1cmX6cm);  Surgeon: Maeola Harman, MD;  Location: [facility] OR;  Service: Vascular;  Laterality: Left;   EYE SURGERY     PATCH ANGIOPLASTY Bilateral 12/26/2021   Procedure: PATCH ANGIOPLASTY USING XENOSURE BIOLOGIC PATCH (1cmx6cm);  Surgeon: Maeola Harman, MD;  Location: [facility] OR;  Service: Vascular;  Laterality: Bilateral;   THROMBECTOMY FEMORAL  ARTERY Bilateral 12/26/2021   Procedure: BILATERAL FEMORAL ARTERY THROMBECTOMY;  Surgeon: Maeola Harman, MD;  Location: [facility] OR;  Service: Vascular;  Laterality: Bilateral;   ULTRASOUND GUIDANCE FOR VASCULAR ACCESS Bilateral 12/26/2021   Procedure: ULTRASOUND GUIDANCE FOR VASCULAR ACCESS;  Surgeon: Maeola Harman, MD;  Location: [facility] OR;  Service: Vascular;  Laterality: Bilateral;    No Active Allergies  Prior to Admission medications   Medication Sig Start Date End Date Taking? Authorizing Provider  acetaminophen (TYLENOL) 325 MG tablet Take 2 tablets (650 mg total) by mouth 3 (three) times daily. 01/19/22   Love, Evlyn Kanner, PA-C  Ascorbic Acid (VITAMIN C PO) Take 1 capsule by mouth daily.    [provider]  bisacodyl (DULCOLAX) 10 MG suppository Place 1 suppository (10 mg total) rectally every other day. After supper 01/19/22   Love, Evlyn Kanner, PA-C  butalbital-acetaminophen-caffeine (FIORICET) [phone] MG tablet Take 1 tablet by mouth daily as needed for headache (for headache). 01/26/22   Love, Evlyn Kanner, PA-C  cholecalciferol (VITAMIN D) 25 MCG tablet Take 1 tablet (1,000 Units total) by mouth daily. 01/20/22   Love, Evlyn Kanner, PA-C  citalopram (CELEXA) 20 MG tablet Take 1 tablet (20 mg total) by mouth daily. 01/20/22   Love, Evlyn Kanner, PA-C  dutasteride (AVODART) 0.5 MG capsule Take 1 capsule (0.5 mg total) by mouth daily after supper. 01/26/22   Love, Evlyn Kanner, PA-C  enoxaparin (LOVENOX) 40 MG/0.4ML injection Inject 0.4 mLs (40 mg total) into the skin daily. 01/20/22   Love, Evlyn Kanner, PA-C  feeding supplement (ENSURE ENLIVE / ENSURE PLUS) LIQD Take 237 mLs by  mouth with breakfast, with lunch, and with evening meal. 01/19/22   Love, Evlyn Kanner, PA-C  ferrous gluconate (FERGON) 324 MG tablet Take 324 mg by mouth daily with breakfast.    [provider]  melatonin 3 MG TABS tablet Take 1 tablet (3 mg total) by mouth at bedtime. 01/19/22   Love, Evlyn Kanner, PA-C   mirtazapine (REMERON) 7.5 MG tablet Take 7.5 mg by mouth at bedtime. 02/06/22   [provider]  naproxen (NAPROSYN) 250 MG tablet Take 250 mg by mouth 2 (two) times daily. 02/10/22   [provider]  Pediatric Multiple Vitamins (CHILDRENS MULTIVITAMIN) chewable tablet Chew 1 tablet by mouth 2 (two) times daily. 01/19/22   Love, Evlyn Kanner, PA-C  polycarbophil (FIBERCON) 625 MG tablet Take 1 tablet (625 mg total) by mouth 2 (two) times daily. 01/19/22   Love, Evlyn Kanner, PA-C  polyethylene glycol (MIRALAX / GLYCOLAX) 17 g packet Take 17 g by mouth daily as needed for mild constipation. 01/19/22   Love, Evlyn Kanner, PA-C  simvastatin (ZOCOR) 20 MG tablet Take 20 mg by mouth at bedtime. 07/18/19   [provider]  topiramate (TOPAMAX) 25 MG tablet Take 1 tablet (25 mg total) by mouth at bedtime. 01/26/22   Love, Evlyn Kanner, PA-C  zolpidem (AMBIEN) 5 MG tablet Take 1 tablet (5 mg total) by mouth at bedtime. 01/26/22   Love, Evlyn Kanner, PA-C    Social History   Socioeconomic History   Marital status: Married    Spouse name: Not on file   Number of children: Not on file   Years of education: Not on file   Highest education level: Not on file  Occupational History   Not on file  Tobacco Use   Smoking status: Never   Smokeless tobacco: Never  Vaping Use   Vaping Use: Never used  Substance and Sexual Activity   Alcohol use: Never   Drug use: Never   Sexual activity: Not Currently  Other Topics Concern   Not on file  Social History Narrative   Not on file   Social Determinants of Health   Financial Resource Strain: Not on file  Food Insecurity: Not on file  Transportation Needs: Not on file  Physical Activity: Not on file  Stress: Not on file  Social Connections: Not on file  Intimate Partner Violence: Not on file    Family History  Problem Relation Age of Onset   Gallbladder disease Mother        cholangiocarcinoma   Gastric cancer Father     ROS: Otherwise  negative unless mentioned in HPI  Physical Examination  Vitals:   02/20/22 0415 02/20/22 0430  BP: 140/72 (!) 146/72  Pulse: 83 81  Resp: 14 17  Temp:    SpO2: 98% 98%   There is no height or weight on file to calculate BMI.  General:  WDWN in NAD, emaciated Gait: Not observed HENT: WNL, normocephalic Pulmonary: normal non-labored breathing, without Rales, rhonchi,  wheezing Cardiac: regular, without  Murmurs, rubs or gallops; with carotid bruits Abdomen: soft, NT/ND, no masses, palpable abdominal aneurysm Skin: without rashes Vascular Exam/Pulses: 2+ femoral pulses bilaterally Extremities: no ischemic changes, without Gangrene , without cellulitis; without open wounds;  Musculoskeletal: no muscle wasting or atrophy  Neurologic: A&O X 3;  No focal weakness or paresthesias are detected; speech is fluent/normal Psychiatric:  The pt has Abnormal- stoic  affect. Lymph:  Unremarkable  CBC    Component Value Date/Time  WBC 11.2 (H) 02/19/2022 2255   RBC 2.85 (L) 02/19/2022 2255   HGB 9.1 (L) 02/19/2022 2255   HCT 27.6 (L) 02/19/2022 2255   PLT 236 02/19/2022 2255   MCV 96.8 02/19/2022 2255   MCH 31.9 02/19/2022 2255   MCHC 33.0 02/19/2022 2255   RDW 15.9 (H) 02/19/2022 2255   LYMPHSABS 0.5 (L) 02/19/2022 2255   MONOABS 0.6 02/19/2022 2255   EOSABS 0.0 02/19/2022 2255   BASOSABS 0.0 02/19/2022 2255    BMET    Component Value Date/Time   NA 133 (L) 02/19/2022 2238   K 3.8 02/19/2022 2238   CL 101 02/19/2022 2238   CO2 24 02/19/2022 2238   GLUCOSE 121 (H) 02/19/2022 2238   BUN 36 (H) 02/19/2022 2238   CREATININE 0.73 02/19/2022 2238   CALCIUM 8.7 (L) 02/19/2022 2238   GFRNONAA >60 02/19/2022 2238   GFRAA >60 02/04/2020 0529    COAGS: Lab Results  Component Value Date   INR 1.4 (H) 12/27/2021   INR 1.8 (H) 12/26/2021   INR 2.1 (H) 12/26/2021     Non-Invasive Vascular Imaging:   See CT  ASSESSMENT/PLAN: 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  I am unsure as to whether this is from aortoenteric fistula (imaging demonstrates possible involvement with the third portion of the duodenum) versus primary infection from seeded endograft. This is a rare event, affecting less than 1% of individuals requiring endovascular aortic repair.  Regardless, Allen Arias is emaciated with failure to thrive.  I do not think he would survive an open operation.  I had a long conversation with him through the [place] interpreter.  I discussed that this would require an open abdominal operation.  Allen Arias made it clear he did not want to have his abdomen opened under any circumstances.  He would benefit from palliative care consultation.  I plan to discuss this with his primary surgeon Dr. Randie Heinz later today.  Please consult palliative care today Further recommendations to follow later today after discussing the pt with Dr. Luciana Axe MD [facility] [phone] 02/20/2022  5:04 AM

## 2022-02-20 NOTE — Progress Notes (Addendum)
Pharmacy Antibiotic Note  Allen Arias is a 81 y.o. male admitted on 02/19/2022 with  infected aortic graft .  Pharmacy has been consulted for Vancomycin/Zosyn dosing. WBC mildly elevated. Renal function good.   Plan: Vancomycin 1000 mg IV q24h >>>Estimated AUC: 505 Zosyn 3.375G IV q8h to be infused over 4 hours Trend WBC, temp, renal function  F/U infectious work-up Drug levels as indicated   Temp (24hrs), Avg:97.1 F (36.2 C), Min:97.1 F (36.2 C), Max:97.1 F (36.2 C)  Recent Labs  Lab 02/19/22 2238 02/19/22 2255  WBC  --  11.2*  CREATININE 0.73  --   LATICACIDVEN 1.1  --     Estimated Creatinine Clearance: 52 mL/min (by C-G formula based on SCr of 0.73 mg/dL).    No Active Allergies  Abran Duke, PharmD, BCPS Clinical Pharmacist Phone: [phone]

## 2022-02-20 NOTE — H&P (Signed)
History and Physical    Allen Arias GYB:[id number] DOB: 11-Jun-1941 DOA: 02/19/2022  PCP: [name], MD   Patient coming from: SNF   Chief Complaint: Sleeping more, not eating or drinking    HPI: Allen Arias is a pleasant 81 y.o. 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  Patient's family became concerned that he has had a progressive decline at his nursing facility marked by worsening somnolence and decreased appetite, barely eating or drinking anything for the past 3 days.  Patient denied abdominal pain, chest pain, fever, chills, melena, hematochezia, nausea, or vomiting.  [place] interpreter utilized [phone]).   ED Course: Upon arrival to the ED, patient is found to be afebrile and saturating mid 90s on room air with normal heart rate and stable blood pressure.  CBC notable for mild leukocytosis and stable normocytic anemia.  CBC features a sodium 133, elevated BUN to creatinine ratio, albumin 2.4, and mild elevation in transaminases.  Fecal occult blood testing is positive.  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  Vascular surgery was consulted by the ED physician and the patient was treated with vancomycin, Zosyn, and a liter of saline.  Review of Systems:  All other systems reviewed and apart from HPI, are negative.  Past Medical History:  Diagnosis Date   AAA (abdominal aortic aneurysm) (HCC)    BPH (benign prostatic hyperplasia)    Elevated lipids    Hypertension    Nocturia    UTI (urinary tract infection) 01/19/2022    Past Surgical History:  Procedure Laterality Date   ABDOMINAL AORTIC ENDOVASCULAR STENT GRAFT N/A 12/26/2021   Procedure: ABDOMINAL AORTIC  ENDOVASCULAR STENT GRAFT;  Surgeon: Maeola Harman, MD;  Location: [facility] OR;  Service: Vascular;  Laterality: N/A;   CARPAL TUNNEL RELEASE     ENDARTERECTOMY FEMORAL Right 12/26/2021   Procedure: RIGHT ENDARTERECTOMY FEMORAL;  Surgeon: Maeola Harman, MD;  Location: [facility] OR;  Service: Vascular;  Laterality: Right;   ENDARTERECTOMY POPLITEAL Left 12/26/2021   Procedure: LEFT ENDARTERECTOMY POPLITEAL WITH ANGIOPLASTY USING XENOSURE BIOPATCH (1cmX6cm);  Surgeon: Maeola Harman, MD;  Location: [facility] OR;  Service: Vascular;  Laterality: Left;   EYE SURGERY     PATCH ANGIOPLASTY Bilateral 12/26/2021   Procedure: PATCH ANGIOPLASTY USING XENOSURE BIOLOGIC PATCH (1cmx6cm);  Surgeon: Maeola Harman, MD;  Location: [facility] OR;  Service: Vascular;  Laterality: Bilateral;   THROMBECTOMY FEMORAL ARTERY Bilateral 12/26/2021   Procedure: BILATERAL FEMORAL ARTERY THROMBECTOMY;  Surgeon: Maeola Harman, MD;  Location: [facility] OR;  Service: Vascular;  Laterality: Bilateral;   ULTRASOUND GUIDANCE FOR VASCULAR ACCESS Bilateral 12/26/2021   Procedure: ULTRASOUND GUIDANCE FOR VASCULAR ACCESS;  Surgeon: Maeola Harman, MD;  Location: [facility] OR;  Service: Vascular;  Laterality: Bilateral;    Social History:   reports that he has never smoked. He has never used smokeless tobacco. He reports that he does not drink alcohol and does not use drugs.  No Active Allergies  Family History  Problem Relation Age of Onset   Gallbladder disease Mother        cholangiocarcinoma   Gastric cancer Father      Prior to Admission medications   Medication Sig Start Date End Date Taking? Authorizing Provider  acetaminophen (TYLENOL)  325 MG tablet Take 2 tablets (650 mg total) by mouth 3 (three) times daily. 01/19/22   Love, Evlyn KannerPamela S, PA-C  Ascorbic Acid (VITAMIN C PO) Take 1 capsule by mouth daily.    [provider]  bisacodyl (DULCOLAX) 10 MG suppository Place 1 suppository (10 mg  total) rectally every other day. After supper 01/19/22   Love, Evlyn KannerPamela S, PA-C  butalbital-acetaminophen-caffeine (FIORICET) [phone] [id number] MG tablet Take 1 tablet by mouth daily as needed for headache (for headache). 01/26/22   Love, Evlyn KannerPamela S, PA-C  cholecalciferol (VITAMIN D) 25 MCG tablet Take 1 tablet (1,000 Units total) by mouth daily. 01/20/22   Love, Evlyn KannerPamela S, PA-C  citalopram (CELEXA) 20 MG tablet Take 1 tablet (20 mg total) by mouth daily. 01/20/22   Love, Evlyn KannerPamela S, PA-C  dutasteride (AVODART) 0.5 MG capsule Take 1 capsule (0.5 mg total) by mouth daily after supper. 01/26/22   Love, Evlyn KannerPamela S, PA-C  enoxaparin (LOVENOX) 40 MG/0.4ML injection Inject 0.4 mLs (40 mg total) into the skin daily. 01/20/22   Love, Evlyn KannerPamela S, PA-C  feeding supplement (ENSURE ENLIVE / ENSURE PLUS) LIQD Take 237 mLs by mouth with breakfast, with lunch, and with evening meal. 01/19/22   Love, Evlyn KannerPamela S, PA-C  ferrous gluconate (FERGON) 324 MG tablet Take 324 mg by mouth daily with breakfast.    [provider]  melatonin 3 MG TABS tablet Take 1 tablet (3 mg total) by mouth at bedtime. 01/19/22   Love, Evlyn KannerPamela S, PA-C  mirtazapine (REMERON) 7.5 MG tablet Take 7.5 mg by mouth at bedtime. 02/06/22   [provider]  naproxen (NAPROSYN) 250 MG tablet Take 250 mg by mouth 2 (two) times daily. 02/10/22   [provider]  Pediatric Multiple Vitamins (CHILDRENS MULTIVITAMIN) chewable tablet Chew 1 tablet by mouth 2 (two) times daily. 01/19/22   Love, Evlyn KannerPamela S, PA-C  polycarbophil (FIBERCON) 625 MG tablet Take 1 tablet (625 mg total) by mouth 2 (two) times daily. 01/19/22   Love, Evlyn KannerPamela S, PA-C  polyethylene glycol (MIRALAX / GLYCOLAX) 17 g packet Take 17 g by mouth daily as needed for mild constipation. 01/19/22   Love, Evlyn KannerPamela S, PA-C  simvastatin (ZOCOR) 20 MG tablet Take 20 mg by mouth at bedtime. 07/18/19   [provider]  topiramate (TOPAMAX) 25 MG tablet Take 1 tablet (25 mg total) by mouth at bedtime. 01/26/22    Love, Evlyn KannerPamela S, PA-C  zolpidem (AMBIEN) 5 MG tablet Take 1 tablet (5 mg total) by mouth at bedtime. 01/26/22   Jacquelynn CreeLove, Pamela S, PA-C    Physical Exam: Vitals:   02/20/22 0430 02/20/22 0445 02/20/22 0500 02/20/22 0515  BP: (!) 146/72 (!) 145/70 (!) 143/70 (!) 146/81  Pulse: 81 82 85 76  Resp: 17 14 14 15   Temp:      TempSrc:      SpO2: 98% 97% 98% 96%    Constitutional: NAD, calm, cachectic   Eyes: PERTLA, lids and conjunctivae normal ENMT: Mucous membranes are dry. Posterior pharynx clear of any exudate or lesions.   Neck: supple, no masses  Respiratory: no wheezing, no crackles. No accessory muscle use.  Cardiovascular: S1 & S2 heard, regular rate and rhythm. No extremity edema.  Abdomen: No distension, soft, no rebound pain or guarding. Bowel sounds active.  Musculoskeletal: no clubbing / cyanosis. No joint deformity upper and lower extremities.   Skin: no significant rashes, lesions, ulcers. Warm, dry, well-perfused. Neurologic: CN 2-12 grossly intact. Moving all extremities. Alert and oriented.  Psychiatric: Calm. Cooperative.    Labs and Imaging on Admission: I have personally reviewed following labs and imaging studies  CBC: Recent Labs  Lab 02/19/22 2255  WBC 11.2*  NEUTROABS 9.9*  HGB 9.1*  HCT 27.6*  MCV 96.8  PLT 236   Basic Metabolic Panel: Recent Labs  Lab 02/19/22 2238  NA 133*  K 3.8  CL 101  CO2 24  GLUCOSE 121*  BUN 36*  CREATININE 0.73  CALCIUM 8.7*   GFR: Estimated Creatinine Clearance: 52 mL/min (by C-G formula based on SCr of 0.73 mg/dL). Liver Function Tests: Recent Labs  Lab 02/19/22 2238  AST 54*  ALT 48*  ALKPHOS 76  BILITOT 1.2  PROT 5.9*  ALBUMIN 2.4*   No results for input(s): "LIPASE", "AMYLASE" in the last 168 hours. No results for input(s): "AMMONIA" in the last 168 hours. Coagulation Profile: No results for input(s): "INR", "PROTIME" in the last 168 hours. Cardiac Enzymes: No results for input(s): "CKTOTAL", "CKMB",  "CKMBINDEX", "TROPONINI" in the last 168 hours. BNP (last 3 results) No results for input(s): "PROBNP" in the last 8760 hours. HbA1C: No results for input(s): "HGBA1C" in the last 72 hours. CBG: No results for input(s): "GLUCAP" in the last 168 hours. Lipid Profile: No results for input(s): "CHOL", "HDL", "LDLCALC", "TRIG", "CHOLHDL", "LDLDIRECT" in the last 72 hours. Thyroid Function Tests: No results for input(s): "TSH", "T4TOTAL", "FREET4", "T3FREE", "THYROIDAB" in the last 72 hours. Anemia Panel: No results for input(s): "VITAMINB12", "FOLATE", "FERRITIN", "TIBC", "IRON", "RETICCTPCT" in the last 72 hours. Urine analysis:    Component Value Date/Time   COLORURINE AMBER (A) 02/20/2022 0153   APPEARANCEUR HAZY (A) 02/20/2022 0153   LABSPEC 1.034 (H) 02/20/2022 0153   PHURINE 5.0 02/20/2022 0153   GLUCOSEU NEGATIVE 02/20/2022 0153   HGBUR SMALL (A) 02/20/2022 0153   BILIRUBINUR NEGATIVE 02/20/2022 0153   KETONESUR NEGATIVE 02/20/2022 0153   PROTEINUR NEGATIVE 02/20/2022 0153   NITRITE POSITIVE (A) 02/20/2022 0153   LEUKOCYTESUR MODERATE (A) 02/20/2022 0153   Sepsis Labs: @LABRCNTIP (procalcitonin:4,lacticidven:4) ) Recent Results (from the past 240 hour(s))  Resp Panel by RT-PCR (Flu A&B, Covid) Anterior Nasal Swab     Status: None   Collection Time: 02/19/22  9:49 PM   Specimen: Anterior Nasal Swab  Result Value Ref Range Status   SARS Coronavirus 2 by RT PCR NEGATIVE NEGATIVE Final    Comment: (NOTE) SARS-CoV-2 target nucleic acids are NOT DETECTED.  The SARS-CoV-2 RNA is generally detectable in upper respiratory specimens during the acute phase of infection. The lowest concentration of SARS-CoV-2 viral copies this assay can detect is 138 copies/mL. A negative result does not preclude SARS-Cov-2 infection and should not be used as the sole basis for treatment or other patient management decisions. A negative result may occur with  improper specimen collection/handling,  submission of specimen other than nasopharyngeal swab, presence of viral mutation(s) within the areas targeted by this assay, and inadequate number of viral copies(<138 copies/mL). A negative result must be combined with clinical observations, patient history, and epidemiological information. The expected result is Negative.  Fact Sheet for Patients:  02/21/22  Fact Sheet for Healthcare Providers:  BloggerCourse.com  This test is no t yet approved or cleared by the [place] FDA and  has been authorized for detection and/or diagnosis of SARS-CoV-2 by FDA under an Emergency Use Authorization (EUA). This EUA will remain  in effect (meaning this test can be used) for the duration of the COVID-19 declaration under Section 564(b)(1) of  the Act, 21 U.S.C.section 360bbb-3(b)(1), unless the authorization is terminated  or revoked sooner.       Influenza A by PCR NEGATIVE NEGATIVE Final   Influenza B by PCR NEGATIVE NEGATIVE Final    Comment: (NOTE) The Xpert Xpress SARS-CoV-2/FLU/RSV plus assay is intended as an aid in the diagnosis of influenza from Nasopharyngeal swab specimens and should not be used as a sole basis for treatment. Nasal washings and aspirates are unacceptable for Xpert Xpress SARS-CoV-2/FLU/RSV testing.  Fact Sheet for Patients: BloggerCourse.com  Fact Sheet for Healthcare Providers: SeriousBroker.it  This test is not yet approved or cleared by the [place] FDA and has been authorized for detection and/or diagnosis of SARS-CoV-2 by FDA under an Emergency Use Authorization (EUA). This EUA will remain in effect (meaning this test can be used) for the duration of the COVID-19 declaration under Section 564(b)(1) of the Act, 21 U.S.C. section 360bbb-3(b)(1), unless the authorization is terminated or revoked.  Performed at [facility], [address], [place], [place] [address]      Radiological Exams on Admission: CT ABDOMEN PELVIS W CONTRAST  Addendum Date: 02/20/2022   ADDENDUM REPORT: 02/20/2022 01:04 ADDENDUM: Critical Value/emergent results were called by telephone at the time of interpretation on 02/20/2022 at 12:59 am to provider Dr. Adela Lank, who verbally acknowledged these results. Electronically Signed   By: Charlett Nose M.D.   On: 02/20/2022 01:04   Result Date: 02/20/2022 CLINICAL DATA:  Abdominal pain, acute, nonlocalized EXAM: CT ABDOMEN AND PELVIS WITH CONTRAST TECHNIQUE: Multidetector CT imaging of the abdomen and pelvis was performed using the standard protocol following bolus administration of intravenous contrast. RADIATION DOSE REDUCTION: This exam was performed according to the departmental dose-optimization program which includes automated exposure control, adjustment of the mA and/or kV according to patient size and/or use of iterative reconstruction technique. CONTRAST:  35mL OMNIPAQUE IOHEXOL 300 MG/ML  SOLN COMPARISON:  11/04/2021 FINDINGS: Lower chest: Coronary artery, aortic atherosclerosis. No acute abnormality. Hepatobiliary: Small layering gallstones within the gallbladder. No biliary ductal dilatation. 5 mm cyst in the left hepatic lobe, stable. No suspicious focal hepatic abnormality. Pancreas: No focal abnormality or ductal dilatation. Spleen: No focal abnormality.  Normal size. Adrenals/Urinary Tract: Adrenal glands unremarkable. Mild left hydronephrosis. The left ureter is dilated to the the level of the left iliac vessels. No stones. Right lower pole renal cyst is unchanged. No follow-up imaging recommended. Urinary bladder unremarkable. Stomach/Bowel: No evidence of bowel obstruction. Grossly unremarkable. Vascular/Lymphatic: Patient is status post stent graft repair of abdominal aortic aneurysm. There is new gas within the aneurysm sac. Aneurysm sac appears expanded with complex material. The aneurysm sac  extends into the left common iliac artery, new since prior study. There is also abnormal soft tissue and gas extending external to the aneurysm sac superiorly near the 3rd portion of the duodenum and also into the retroperitoneum/periaortic regions more inferiorly. 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 Reproductive: No visible focal abnormality. Other: No free fluid or free air. Musculoskeletal: No acute bony abnormality. IMPRESSION: Status post stent graft repair of AAA. Gas is seen throughout the aneurysm sac with expansion of the aneurysm sac. 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 Exact level/site of fistula not definitively visualized, but possibly 3rd portion of the duodenum. Mild associated left hydronephrosis, likely  related to infectious process in the retroperitoneum near the left iliac vessels. Cholelithiasis. Attempts were made to contact the ER provider who is not able to be reached. Continued attempts will be made to verbally discuss this with the physician and addendum will be made at that time. Electronically Signed: By: Charlett Nose M.D. On: 02/20/2022 00:48   CT Head Wo Contrast  Result Date: 02/19/2022 CLINICAL DATA:  Mental status change, unknown cause EXAM: CT HEAD WITHOUT CONTRAST TECHNIQUE: Contiguous axial images were obtained from the base of the skull through the vertex without intravenous contrast. RADIATION DOSE REDUCTION: This exam was performed according to the departmental dose-optimization program which includes automated exposure control, adjustment of the mA and/or kV according to patient size and/or use of iterative reconstruction technique. COMPARISON:  None Available. FINDINGS: Brain: Age related atrophy. There is moderate to advanced  periventricular and deep white matter hypodensity typical of chronic small vessel ischemia. 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 There is no evidence of acute ischemia. No hemorrhage. No subdural or extra-axial collection. No midline shift or mass effect. Vascular: Atherosclerosis of skullbase vasculature without hyperdense vessel or abnormal calcification. Skull: No fracture or focal lesion. Sinuses/Orbits: Opacification of a single ethmoid air cell. Otherwise clear. Other: None. IMPRESSION: 1. No acute intracranial abnormality. 2. 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 Electronically Signed   By: Narda Rutherford M.D.   On: 02/19/2022 23:31   DG Chest 2 View  Result Date: 02/19/2022 CLINICAL DATA:  Altered mental status. EXAM: CHEST - 2 VIEW COMPARISON:  None Available. FINDINGS: No focal consolidation, pleural effusion, pneumothorax the cardiac silhouette is within limits. No acute osseous pathology. Osteopenia with degenerative changes of the spine. Partially visualized endovascular stent graft of the abdominal aorta. IMPRESSION: No active cardiopulmonary disease. Electronically Signed   By: Elgie Collard M.D.   On: 02/19/2022 22:28    EKG: Independently reviewed. SR, incomplete RBBB, LAFB, QTc 494 ms.   Assessment/Plan   1. 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 Failure to thrive  - Continue supportive care, consult palliative   3. Hypertension  - Treat as-needed only for now   4. Hyponatremia  - Serum sodium is 133 on admission in setting of hypovolemia  - Continue isotonic IVF hydration and monitor    DVT  prophylaxis: SCDs  Code Status: Full   Level of Care: Level of care: Med-Surg Family Communication: son updated from ED  Disposition Plan:  Patient is from: SNF  Anticipated d/c is to: SNF  Anticipated d/c date is: TBD Patient currently: pending vascular surgery recommendations, palliative care consultation  Consults called: vascular surgery  Admission status: Inpatient     Briscoe Deutscher, MD [facility]  02/20/2022, 5:35 AM

## 2022-02-20 NOTE — Progress Notes (Signed)
Patient is well-known to the vascular surgery service having undergone endovascular repair several months ago.  Unfortunately this was complicated by paralysis.  I was called regarding recent CT findings demonstrating air within the aortic aneurysm sac.  This is concerning for aortic aneurysm infection.  I am unsure as to whether this is from aortoenteric fistula (imaging demonstrates possible involvement with the third portion of the duodenum) versus primary infection from seeded endograft.  No history of herald bleed. No recent drop in hct. Hemoccult pending.   Endovascular aneurysm repair as a modality used in patients diagnosed with aortoenteric fistula as a bridge to open repair.  The aneurysm sac has not changed in size and has good seal both proximally and distally.  Patient will benefit from hospital medicine admission, broad-spectrum antibiotics, and hydration. Will discuss the role of EGD in the morning to further characterize the etiology of the gas in the aneurysm sac.   Infected aortic aneurysm carries a high morbidity mortality with and without repair.  Victorino Sparrow MD

## 2022-02-21 DIAGNOSIS — Z711 Person with feared health complaint in whom no diagnosis is made: Secondary | ICD-10-CM

## 2022-02-21 DIAGNOSIS — I714 Abdominal aortic aneurysm, without rupture, unspecified: Secondary | ICD-10-CM | POA: Diagnosis not present

## 2022-02-21 DIAGNOSIS — Z789 Other specified health status: Secondary | ICD-10-CM

## 2022-02-21 DIAGNOSIS — T827XXA Infection and inflammatory reaction due to other cardiac and vascular devices, implants and grafts, initial encounter: Secondary | ICD-10-CM | POA: Diagnosis not present

## 2022-02-21 DIAGNOSIS — G8220 Paraplegia, unspecified: Secondary | ICD-10-CM

## 2022-02-21 DIAGNOSIS — R627 Adult failure to thrive: Secondary | ICD-10-CM | POA: Diagnosis not present

## 2022-02-21 DIAGNOSIS — D638 Anemia in other chronic diseases classified elsewhere: Secondary | ICD-10-CM

## 2022-02-21 DIAGNOSIS — Z515 Encounter for palliative care: Secondary | ICD-10-CM

## 2022-02-21 DIAGNOSIS — G9341 Metabolic encephalopathy: Secondary | ICD-10-CM

## 2022-02-21 DIAGNOSIS — E43 Unspecified severe protein-calorie malnutrition: Secondary | ICD-10-CM

## 2022-02-21 DIAGNOSIS — Z7189 Other specified counseling: Secondary | ICD-10-CM

## 2022-02-21 DIAGNOSIS — Z9889 Other specified postprocedural states: Secondary | ICD-10-CM

## 2022-02-21 DIAGNOSIS — Z8679 Personal history of other diseases of the circulatory system: Secondary | ICD-10-CM

## 2022-02-21 DIAGNOSIS — R748 Abnormal levels of other serum enzymes: Secondary | ICD-10-CM | POA: Diagnosis not present

## 2022-02-21 DIAGNOSIS — E876 Hypokalemia: Secondary | ICD-10-CM

## 2022-02-21 LAB — COMPREHENSIVE METABOLIC PANEL
ALT: 43 U/L (ref 0–44)
AST: 46 U/L — ABNORMAL HIGH (ref 15–41)
Albumin: 2 g/dL — ABNORMAL LOW (ref 3.5–5.0)
Alkaline Phosphatase: 67 U/L (ref 38–126)
Anion gap: 8 (ref 5–15)
BUN: 26 mg/dL — ABNORMAL HIGH (ref 8–23)
CO2: 20 mmol/L — ABNORMAL LOW (ref 22–32)
Calcium: 7.9 mg/dL — ABNORMAL LOW (ref 8.9–10.3)
Chloride: 105 mmol/L (ref 98–111)
Creatinine, Ser: 0.56 mg/dL — ABNORMAL LOW (ref 0.61–1.24)
GFR, Estimated: >60 mL/min (ref >60)
Glucose, Bld: 107 mg/dL — ABNORMAL HIGH (ref 70–99)
Potassium: 3.4 mmol/L — ABNORMAL LOW (ref 3.5–5.1)
Sodium: 133 mmol/L — ABNORMAL LOW (ref 135–145)
Total Bilirubin: 1.2 mg/dL (ref 0.3–1.2)
Total Protein: 5.1 g/dL — ABNORMAL LOW (ref 6.5–8.1)

## 2022-02-21 LAB — CBC
HCT: 29.4 % — ABNORMAL LOW (ref 39.0–52.0)
Hemoglobin: 9.6 g/dL — ABNORMAL LOW (ref 13.0–17.0)
MCH: 31.7 pg (ref 26.0–34.0)
MCHC: 32.7 g/dL (ref 30.0–36.0)
MCV: 97 fL (ref 80.0–100.0)
Platelets: 248 10*3/uL (ref 150–400)
RBC: 3.03 MIL/uL — ABNORMAL LOW (ref 4.22–5.81)
RDW: 15.9 % — ABNORMAL HIGH (ref 11.5–15.5)
WBC: 9.9 10*3/uL (ref 4.0–10.5)
nRBC: 0 % (ref 0.0–0.2)

## 2022-02-21 LAB — MAGNESIUM: Magnesium: 2 mg/dL (ref 1.7–2.4)

## 2022-02-21 MED ORDER — MIRTAZAPINE 15 MG PO TABS
7.5000 mg | ORAL_TABLET | Freq: Every day | ORAL | Status: DC
  Administered 2022-02-21 – 2022-02-22 (×2): 7.5 mg via ORAL
  Filled 2022-02-21 (×2): qty 1

## 2022-02-21 MED ORDER — SODIUM CHLORIDE 0.9 % IV SOLN
2.0000 g | Freq: Three times a day (TID) | INTRAVENOUS | Status: DC
  Administered 2022-02-21 – 2022-02-23 (×7): 2 g via INTRAVENOUS
  Filled 2022-02-21 (×7): qty 12.5

## 2022-02-21 MED ORDER — DUTASTERIDE 0.5 MG PO CAPS
0.5000 mg | ORAL_CAPSULE | Freq: Every day | ORAL | Status: DC
  Administered 2022-02-21 – 2022-02-22 (×2): 0.5 mg via ORAL
  Filled 2022-02-21 (×3): qty 1

## 2022-02-21 MED ORDER — POTASSIUM CHLORIDE CRYS ER 20 MEQ PO TBCR
40.0000 meq | EXTENDED_RELEASE_TABLET | ORAL | Status: AC
  Administered 2022-02-21 (×2): 40 meq via ORAL
  Filled 2022-02-21 (×2): qty 2

## 2022-02-21 MED ORDER — METRONIDAZOLE 500 MG/100ML IV SOLN
500.0000 mg | Freq: Two times a day (BID) | INTRAVENOUS | Status: DC
  Administered 2022-02-21 – 2022-02-23 (×5): 500 mg via INTRAVENOUS
  Filled 2022-02-21 (×5): qty 100

## 2022-02-21 MED ORDER — GABAPENTIN 300 MG PO CAPS
300.0000 mg | ORAL_CAPSULE | Freq: Three times a day (TID) | ORAL | Status: DC
  Administered 2022-02-21 – 2022-02-23 (×6): 300 mg via ORAL
  Filled 2022-02-21 (×7): qty 1

## 2022-02-21 MED ORDER — DULOXETINE HCL 30 MG PO CPEP
30.0000 mg | ORAL_CAPSULE | Freq: Every day | ORAL | Status: DC
  Administered 2022-02-21 – 2022-02-23 (×3): 30 mg via ORAL
  Filled 2022-02-21 (×3): qty 1

## 2022-02-21 MED ORDER — MELATONIN 3 MG PO TABS
3.0000 mg | ORAL_TABLET | Freq: Every day | ORAL | Status: DC
  Filled 2022-02-21: qty 1

## 2022-02-21 NOTE — Progress Notes (Signed)
[facility] floor coverage for both [facility] and [facility] (remote) on night of 02/20/22 into morning of 02/21/22:    I was notified by RN of the patient's complaint of generalized body pains, requesting pain medication, along with his refusal for as needed Tylenol to address this.  I subsequently placed order for prn IV fentanyl.       Newton Pigg, DO Hospitalist

## 2022-02-21 NOTE — Plan of Care (Signed)
Vitals stable, no complained of pain. With wound in mid sacral, foam in placed.  Problem: Education: Goal: Knowledge of General Education information will improve Description: Including pain rating scale, medication(s)/side effects and non-pharmacologic comfort measures Outcome: Progressing   Problem: Health Behavior/Discharge Planning: Goal: Ability to manage health-related needs will improve Outcome: Progressing   Problem: Clinical Measurements: Goal: Ability to maintain clinical measurements within normal limits will improve Outcome: Progressing Goal: Will remain free from infection Outcome: Progressing Goal: Diagnostic test results will improve Outcome: Progressing Goal: Respiratory complications will improve Outcome: Progressing Goal: Cardiovascular complication will be avoided Outcome: Progressing   Problem: Activity: Goal: Risk for activity intolerance will decrease Outcome: Progressing   Problem: Nutrition: Goal: Adequate nutrition will be maintained Outcome: Progressing   Problem: Coping: Goal: Level of anxiety will decrease Outcome: Progressing   Problem: Elimination: Goal: Will not experience complications related to bowel motility Outcome: Progressing Goal: Will not experience complications related to urinary retention Outcome: Progressing   Problem: Pain Managment: Goal: General experience of comfort will improve Outcome: Progressing   Problem: Safety: Goal: Ability to remain free from injury will improve Outcome: Progressing   Problem: Skin Integrity: Goal: Risk for impaired skin integrity will decrease Outcome: Progressing

## 2022-02-21 NOTE — Progress Notes (Signed)
[facility] floor coverage for both [facility] and [facility] (remote) on night of 02/20/22 into morning of 02/21/22:    I was notified by RN of the patient's request for prn melatonin for sleep aid.  I subsequently placed order for prn melatonin. Was then notified that the patient had reconsidered, and requested Ambienmelatonin for sleep aid.  I subsequently discontinued order for prn melatonin, and replaced with order for Ambien 5 mg p.o. nightly as needed for insomnia.  Will defer additional review of patient's home medications to day rounder.       Newton Pigg, DO Hospitalist

## 2022-02-21 NOTE — Progress Notes (Signed)
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  He was started on broad-spectrum antibiotics.  Vascular surgery consulted and did not feel he is a good candidate for surgical intervention and recommended palliative care consult.  Palliative medicine consulted as well.  Subjective: Seen and examined earlier this morning with the help of video interpreter with ID number [id number].  Patient's daughter at bedside.  Patient reports feeling better compared to yesterday.  Currently denies pain.  He is awake and oriented to self, place, person, month and year.  We have talked about vascular surgery recommendation.  We also discussed about goal of care including CODE STATUS and pros and cons of CPR and intubation in case of emergency.  I have recommended DNR/DNI given his frailty and poor candidacy for surgical intervention.  Patient and daughter voiced understanding but likes to discuss with the rest of family members before making decision.  They also like to get more information from palliative care.  I have contacted palliative care as well.  Objective: Vitals:   02/21/22 0500 02/21/22 0632 02/21/22 0748 02/21/22 1111  BP:  (!) 147/73 137/69 (!) 146/72  Pulse:  85 85 98  Resp:  18 18 20   Temp:  (!) 97.5 F (36.4 C) 97.6 F (36.4 C) 97.9 F (36.6 C)  TempSrc:  Oral Oral Oral  SpO2:  100% 100% 98%  Weight: 49.1 kg  (P)  47.3 kg     Examination:  GENERAL: Appears frail.  No apparent distress. HEENT: MMM.  Vision and hearing grossly intact.  NECK: Supple.  No apparent JVD.  RESP:  No IWOB.  Fair aeration bilaterally. CVS:  RRR. Heart sounds normal.  ABD/GI/GU: BS+. Abd soft, NTND.  MSK/EXT:  Moves extremities.  Significant muscle mass and subcu fat loss.  BLE diplegia. SKIN: no apparent skin lesion or wound NEURO: Awake, alert and oriented x4 except date.  Has BLE diplegia. PSYCH: Calm. Normal affect.   Procedures:  None  Microbiology summarized: COVID-19 and influenza PCR nonreactive. Blood cultures NGTD.  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  Vascular surgery recommended palliative consult.  Blood cultures NGTD. -Continue broad-spectrum IV antibiotics.  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  Severe malnutrition/failure to thrive Body mass index is 17.9 kg/m (pended). -Goal of care discussion  Goal of care discussion: Extensive discussion with patient's daughter and patient as above.  They like to discuss with other family members before making  decision about CODE STATUS.  Also like to hear from palliative medicine -Palliative medicine notified.  Acute metabolic encephalopathy: Multifactorial including infection, polypharmacy and possible dehydration from poor p.o. intake.  This seems to have improved.  He is awake and oriented x4 except date.  Seems to have fair understanding about his situation. -Reorientation and delirium precautions -We will resume some of all medications at reduced dose  Hypokalemia/hyponatremia -Replenish and recheck. -Check magnesium level  Elevated liver enzymes: Improved. -Check CK  Essential hypertension: BP within acceptable range.  Does not seem to be on medication at home.  Anemia of chronic disease: Hgb at baseline. Recent Labs    01/05/22 7902 01/08/22 0522 01/09/22 0620 01/12/22 0531 01/19/22 0514 01/20/22 0520 01/23/22 0723 01/26/22 0625 02/19/22 2255 02/21/22 0121  HGB 8.1* 8.4* 8.5* 9.3* 7.7* 7.6* 9.7* 9.1* 9.1* 9.6*  -Continue monitoring  Neurogenic bladder: -Continue home medications.  Language barrier -Video interpreter utilized.  DVT prophylaxis:  SCDs Start: 02/20/22 0533  Code Status: Full code Family Communication: Updated patient's daughter at bedside. Level of care: Med-Surg Status is: Inpatient Remains inpatient appropriate because: Due to endograft infection requiring broad-spectrum IV antibiotics   Final disposition: TBD Consultants:  Vascular surgery Palliative medicine  Sch Meds:  Scheduled Meds:  DULoxetine  30 mg Oral Daily   dutasteride  0.5 mg Oral QPC supper   feeding supplement  237 mL Oral BID BM   gabapentin  300 mg Oral TID   melatonin  3 mg Oral QHS   mirtazapine  7.5 mg Oral QHS   potassium chloride  40 mEq Oral Q4H   Continuous Infusions:  ceFEPime (MAXIPIME) IV 2 g (02/21/22 1001)   metronidazole 500 mg (02/21/22 0923)   vancomycin 1,000 mg (02/20/22 2327)   PRN Meds:.acetaminophen **OR** acetaminophen, fentaNYL (SUBLIMAZE)  injection, naLOXone (NARCAN)  injection, senna-docusate, zolpidem  Antimicrobials: Anti-infectives (From admission, onward)    Start     Dose/Rate Route Frequency Ordered Stop   02/21/22 0845  ceFEPIme (MAXIPIME) 2 g in sodium chloride 0.9 % 100 mL IVPB        2 g 200 mL/hr over 30 Minutes Intravenous Every 8 hours 02/21/22 0750     02/21/22 0830  metroNIDAZOLE (FLAGYL) IVPB 500 mg        500 mg 100 mL/hr over 60 Minutes Intravenous 2 times daily 02/21/22 0740 02/26/22 0959   02/20/22 2200  vancomycin (VANCOCIN) IVPB 1000 mg/200 mL premix        1,000 mg 200 mL/hr over 60 Minutes Intravenous Every 24 hours 02/20/22 0549     02/20/22 1000  piperacillin-tazobactam (ZOSYN) IVPB 3.375 g  Status:  Discontinued        3.375 g 12.5 mL/hr over 240 Minutes Intravenous Every 8 hours 02/20/22 0544 02/21/22 0740   02/20/22 0100  vancomycin (VANCOCIN) IVPB 1000 mg/200 mL premix        1,000 mg 200 mL/hr over 60 Minutes Intravenous  Once 02/20/22 0055 02/20/22 0412   02/20/22 0100  piperacillin-tazobactam (ZOSYN) IVPB 3.375 g        3.375 g 100 mL/hr over 30 Minutes Intravenous  Once 02/20/22 0055 02/20/22 0242  I have personally reviewed the following labs and images: CBC: Recent Labs  Lab 02/19/22 2255 02/21/22 0121  WBC 11.2* 9.9  NEUTROABS 9.9*  --   HGB 9.1* 9.6*  HCT 27.6* 29.4*  MCV 96.8 97.0  PLT 236 248   BMP &GFR Recent Labs  Lab 02/19/22 2238 02/21/22 0121  NA 133* 133*  K 3.8 3.4*  CL 101 105  CO2 24 20*  GLUCOSE 121* 107*  BUN 36* 26*  CREATININE 0.73 0.56*  CALCIUM 8.7* 7.9*   Estimated Creatinine Clearance: 52.9 mL/min (A) (by C-G formula based on SCr of 0.56 mg/dL (L)). Liver & Pancreas: Recent Labs  Lab 02/19/22 2238 02/21/22 0121  AST 54* 46*  ALT 48* 43  ALKPHOS 76 67  BILITOT 1.2 1.2  PROT 5.9* 5.1*  ALBUMIN 2.4* 2.0*   No results for input(s): "LIPASE", "AMYLASE" in the last 168 hours. No results for input(s): "AMMONIA" in the last  168 hours. Diabetic: No results for input(s): "HGBA1C" in the last 72 hours. No results for input(s): "GLUCAP" in the last 168 hours. Cardiac Enzymes: No results for input(s): "CKTOTAL", "CKMB", "CKMBINDEX", "TROPONINI" in the last 168 hours. No results for input(s): "PROBNP" in the last 8760 hours. Coagulation Profile: No results for input(s): "INR", "PROTIME" in the last 168 hours. Thyroid Function Tests: No results for input(s): "TSH", "T4TOTAL", "FREET4", "T3FREE", "THYROIDAB" in the last 72 hours. Lipid Profile: No results for input(s): "CHOL", "HDL", "LDLCALC", "TRIG", "CHOLHDL", "LDLDIRECT" in the last 72 hours. Anemia Panel: No results for input(s): "VITAMINB12", "FOLATE", "FERRITIN", "TIBC", "IRON", "RETICCTPCT" in the last 72 hours. Urine analysis:    Component Value Date/Time   COLORURINE AMBER (A) 02/20/2022 0153   APPEARANCEUR HAZY (A) 02/20/2022 0153   LABSPEC 1.034 (H) 02/20/2022 0153   PHURINE 5.0 02/20/2022 0153   GLUCOSEU NEGATIVE 02/20/2022 0153   HGBUR SMALL (A) 02/20/2022 0153   BILIRUBINUR NEGATIVE 02/20/2022 0153   KETONESUR NEGATIVE 02/20/2022 0153   PROTEINUR NEGATIVE 02/20/2022 0153   NITRITE POSITIVE (A) 02/20/2022 0153   LEUKOCYTESUR MODERATE (A) 02/20/2022 0153   Sepsis Labs: Invalid input(s): "PROCALCITONIN", "LACTICIDVEN"  Microbiology: Recent Results (from the past 240 hour(s))  Resp Panel by RT-PCR (Flu A&B, Covid) Anterior Nasal Swab     Status: None   Collection Time: 02/19/22  9:49 PM   Specimen: Anterior Nasal Swab  Result Value Ref Range Status   SARS Coronavirus 2 by RT PCR NEGATIVE NEGATIVE Final    Comment: (NOTE) SARS-CoV-2 target nucleic acids are NOT DETECTED.  The SARS-CoV-2 RNA is generally detectable in upper respiratory specimens during the acute phase of infection. The lowest concentration of SARS-CoV-2 viral copies this assay can detect is 138 copies/mL. A negative result does not preclude SARS-Cov-2 infection and should  not be used as the sole basis for treatment or other patient management decisions. A negative result may occur with  improper specimen collection/handling, submission of specimen other than nasopharyngeal swab, presence of viral mutation(s) within the areas targeted by this assay, and inadequate number of viral copies(<138 copies/mL). A negative result must be combined with clinical observations, patient history, and epidemiological information. The expected result is Negative.  Fact Sheet for Patients:  BloggerCourse.com  Fact Sheet for Healthcare Providers:  SeriousBroker.it  This test is no t yet approved or cleared by the [place] FDA and  has been authorized for detection and/or diagnosis of SARS-CoV-2 by FDA under an Emergency Use Authorization (EUA). This EUA will remain  in effect (meaning this  test can be used) for the duration of the COVID-19 declaration under Section 564(b)(1) of the Act, 21 U.S.C.section 360bbb-3(b)(1), unless the authorization is terminated  or revoked sooner.       Influenza A by PCR NEGATIVE NEGATIVE Final   Influenza B by PCR NEGATIVE NEGATIVE Final    Comment: (NOTE) The Xpert Xpress SARS-CoV-2/FLU/RSV plus assay is intended as an aid in the diagnosis of influenza from Nasopharyngeal swab specimens and should not be used as a sole basis for treatment. Nasal washings and aspirates are unacceptable for Xpert Xpress SARS-CoV-2/FLU/RSV testing.  Fact Sheet for Patients: BloggerCourse.com  Fact Sheet for Healthcare Providers: SeriousBroker.it  This test is not yet approved or cleared by the [place] FDA and has been authorized for detection and/or diagnosis of SARS-CoV-2 by FDA under an Emergency Use Authorization (EUA). This EUA will remain in effect (meaning this test can be used) for the duration of the COVID-19 declaration under  Section 564(b)(1) of the Act, 21 U.S.C. section 360bbb-3(b)(1), unless the authorization is terminated or revoked.  Performed at [facility], [address], [place], [place] [address]   Culture, blood (Routine X 2) w Reflex to ID Panel     Status: None (Preliminary result)   Collection Time: 02/20/22  6:13 AM   Specimen: BLOOD  Result Value Ref Range Status   Specimen Description BLOOD SITE NOT SPECIFIED  Final   Special Requests   Final    BOTTLES DRAWN AEROBIC AND ANAEROBIC Blood Culture results may not be optimal due to an excessive volume of blood received in culture bottles   Culture   Final    NO GROWTH 1 DAY Performed at [facility], [address], [place], [place] [address]    Report Status PENDING  Incomplete  Culture, blood (Routine X 2) w Reflex to ID Panel     Status: None (Preliminary result)   Collection Time: 02/20/22  6:20 AM   Specimen: BLOOD  Result Value Ref Range Status   Specimen Description BLOOD SITE NOT SPECIFIED  Final   Special Requests   Final    BOTTLES DRAWN AEROBIC AND ANAEROBIC Blood Culture adequate volume   Culture   Final    NO GROWTH 1 DAY Performed at [facility], [address], [place], [place] [address]    Report Status PENDING  Incomplete    Radiology Studies: No results found.    Allen Arias T. Bretton Tandy [name]  If 7PM-7AM, please contact night-coverage [email or web address] 02/21/2022, 12:04 PM

## 2022-02-21 NOTE — Plan of Care (Signed)
  Problem: Pain Managment: Goal: General experience of comfort will improve 02/21/2022 1145 by Mohamed-Medani, Kaiyah Eber A, RN Outcome: Progressing 02/21/2022 1145 by Mohamed-Medani, Chari Parmenter A, RN Outcome: Progressing   Problem: Safety: Goal: Ability to remain free from injury will improve 02/21/2022 1145 by Mohamed-Medani, Zyrah Wiswell A, RN Outcome: Progressing 02/21/2022 1145 by Mohamed-Medani, Charlcie Prisco A, RN Outcome: Progressing   Problem: Nutrition: Goal: Adequate nutrition will be maintained 02/21/2022 1145 by Mohamed-Medani, Rosalia Mcavoy A, RN Outcome: Not Progressing 02/21/2022 1145 by Mohamed-Medani, Jaan Fischel A, RN Outcome: Not Progressing

## 2022-02-21 NOTE — Progress Notes (Signed)
Patient ID: Dimitris Shanahan, male   DOB: 1941-07-30, 81 y.o.   MRN: [id number]  I had a long discussion with the patient's daughter at the bedside and outside the patient's room today.  On my exam, the patient appears unwell.  He is malnourished, has lost significant weight since my last time seeing him.  I had a long discussion with the patient's daughter about our options going forward.  I reviewed the details of a typical approach to infected aortic endograft.  I explained the highly complex nature of this surgical problem, and that there is no simple option to treat it.  I explained to her that I do not think he will survive any attempt to treat this infection.  I counseled her that transitioning to palliative care is probably the best option going forward.  Understandably she is upset, but seems to understand the situation well.  She is waiting for her family to gather before making any kind of transition and care plan.  Recommend continued supportive care with IV antibiotics until family can arrive and they can make a decision about comfort measures together.  I will continue to follow the patient while he is in house.  Rande Brunt. Lenell Antu, MD [facility] Phone Number: [phone] 02/21/2022 11:59 AM

## 2022-02-21 NOTE — TOC Progression Note (Signed)
Transition of Care [facility]) - Progression Note    Patient Details  Name: Allen Arias MRN: [id number] Date of Birth: December 18, 1940  Transition of Care [facility]) CM/SW Contact  Bess Kinds, RN Phone Number: [phone] 02/21/2022, 3:48 PM  Clinical Narrative:     Notified by nursing of family's desire for patient to have a wheelchair. Upon review of chart noted family discussions with MD and palliative consult pending. TOC to follow and assist with transition plan as advised.        Expected Discharge Plan and Services                                                 Social Determinants of Health (SDOH) Interventions    Readmission Risk Interventions    12/31/2021   11:53 AM  Readmission Risk Prevention Plan  Medication Screening Complete  Transportation Screening Complete

## 2022-02-21 NOTE — Progress Notes (Signed)
Pharmacy Antibiotic Note  Allen Arias is a 81 y.o. male admitted on 02/19/2022 with  infected aortic graft .  Pharmacy has been consulted for Vancomycin/Cefepime dosing.  WBC 9.9, afebrile Renal function stable  Plan: Vancomycin 1000 mg IV q24h >>>Estimated AUC: 505 Cefepime 2g IV q8h Monitor renal function, micro data, and clinical status Vanc levels as indicated   Temp (24hrs), Avg:97.8 F (36.6 C), Min:97.5 F (36.4 C), Max:98.1 F (36.7 C)  Recent Labs  Lab 02/19/22 2238 02/19/22 2255 02/21/22 0121  WBC  --  11.2* 9.9  CREATININE 0.73  --  0.56*  LATICACIDVEN 1.1  --   --      Estimated Creatinine Clearance: 52.9 mL/min (A) (by C-G formula based on SCr of 0.56 mg/dL (L)).    No Known Allergies  Shirlee More, PharmD PGY2 Infectious Diseases Pharmacy Resident   Please check [email or web address] for unit-specific pharmacy phone numbers

## 2022-02-21 NOTE — Consult Note (Signed)
Consultation Note Date: 02/21/2022   Patient Name: Allen Arias  DOB: July 30, 1941  MRN: [id number]  Age / Sex: 81 y.o., male  PCP: Bernerd Limbo, MD Referring Physician: Mercy Riding, MD  Reason for Consultation: Establishing goals of care, "infected aortic endograft, vascular sugery concerned for high morbidity with or without surgery"  HPI/Patient Profile: 81 y.o. 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  Patient was evaluated by vascular surgery who does not think patient will survive any attempts to treat his infection, which would likely require an open operation - hospice was recommended.   Clinical Assessment and Goals of Care: I have reviewed medical records including EPIC notes, labs, and imaging. Received report from primary RN -no acute concerns.  Went to visit patient at bedside -son-in-law/[name] was present.  He tells me that patient's daughter/[name] will be back around 4 PM.  Scheduled meeting with daughter for 4 PM.  Patient was lying in bed asleep -I did not attempt to wake him. No signs or non-verbal gestures of pain or discomfort noted. No respiratory distress, increased work of breathing, or secretions noted.   4:00 PM Met with daughter/[name] to discuss diagnosis, prognosis, GOC, EOL wishes, disposition, and options. She requests to meet privately as patient is tired.   I introduced Palliative Medicine as specialized medical care for people living with serious illness. It focuses on  providing relief from the symptoms and stress of a serious illness. The goal is to improve quality of life for both the patient and the family.  We discussed a brief life review of the patient as well as functional and nutritional status.  Patient is married -his wife has dementia.  They had 2 children 1 daughter and 1 son.  Prior to hospitalization patient was living at long-term care/[facility], which is where he has been for 3 weeks.  Therapeutic listening provided as she reflects on how difficult things have been since patient's surgery in April.  We discussed patient's current illness and what it means in the larger context of patient's on-going co-morbidities.  Daughter has a clear understanding of patient's current acute medical condition and that hospice is recommended.  Natural disease trajectory and expectations at EOL were discussed.   Provided education and counseling at length on the philosophy and benefits of hospice care. Discussed that it offers a holistic approach to care in the setting of end-stage illness, and is about supporting the patient where they are allowing nature to take it's course. Discussed the hospice team includes RNs, physicians, social workers, and chaplains. They can provide personal care, support for the family, and help keep patient out of the hospital as well as assist with DME needs for home hospice. Education provided on the difference between home vs residential hospice.  Daughter knows that patient would prefer to go home with hospice but she is uncertain if family would be able to meet his needs.  She will continue discussions with patient and family around which option might be best on discharge to residential versus home with hospice.  We talked about transition to comfort measures in house and what that would entail inclusive of medications to  control pain, dyspnea, agitation, nausea, and itching. We discussed stopping all unnecessary measures such as blood  draws, needle sticks, oxygen, antibiotics, CBGs/insulin, cardiac monitoring, IVF, and frequent vital signs.  Daughter would like to discuss options given to her today around transition to full comfort care and home versus residential hospice with patient and her brother who will be arriving from [place] tomorrow afternoon.  Encouraged patient/family to consider DNR/DNI status understanding evidenced based poor outcomes in similar hospitalized patient, as the cause of arrest is likely associated with advanced chronic/terminal illness rather than an easily reversible acute cardio-pulmonary event. I explained that DNR/DNI does not change the medical plan and it only comes into effect after a person has arrested (died).  It is a protective measure to keep [place] from harming the patient in their last moments of life.  Offered to have code status conversation with patient  -daughter does not feel as if this is a good time.  She requests time to speak with him personally and is agreeable for PMT follow-up tomorrow.  As of now daughter understands that he would receive CPR, defibrillation, ACLS medications, and/or intubation.  Daughter does feel that DNR/DNI would be most appropriate for his current situation.  Discussed with patient/family the importance of continued conversation with each other and the medical providers regarding overall plan of care and treatment options, ensuring decisions are within the context of the patient's values and GOCs.    Questions and concerns were addressed. The patient/family was encouraged to call with questions and/or concerns. PMT card was provided.   Primary Decision Maker: PATIENT with a lot of assistance from his family    SUMMARY OF RECOMMENDATIONS   Continue current gentle medical interventions Continue full code for now  Daughter requests time to review information given to her today around transition to comfort/hospice with patient and with her brother who will be  arriving from [place] tomorrow 6/18 afternoon.  They are deciding on discharge to residential hospice versus home with hospice PMT will continue discussions around code status and transition to full comfort measures in house tomorrow 6/18 per family request PMT will continue to follow and support holistically   Code Status/Advance Care Planning: Full code  Palliative Prophylaxis:  Aspiration, Bowel Regimen, Delirium Protocol, Frequent Pain Assessment, Oral Care, and Turn Reposition  Additional Recommendations (Limitations, Scope, Preferences): Full Scope Treatment  Psycho-social/Spiritual:  Desire for further Chaplaincy support:no Created space and opportunity for patient and family to express thoughts and feelings regarding patient's current medical situation.  Emotional support and therapeutic listening provided.  Prognosis:  < 2 weeks  Discharge Planning: To Be Determined      Primary Diagnoses: Present on Admission:  Infected aortic endograft, initial encounter ([place])  Hyponatremia  Paraplegia (HCC)  Essential hypertension  Failure to thrive in adult  AAA (abdominal aortic aneurysm) ([place])   I have reviewed the medical record, interviewed the patient and family, and examined the patient. The following aspects are pertinent.  Past Medical History:  Diagnosis Date   AAA (abdominal aortic aneurysm) (HCC)    BPH (benign prostatic hyperplasia)    Elevated lipids    Hypertension    Nocturia    UTI (urinary tract infection) 01/19/2022   Social History   Socioeconomic History   Marital status: Married    Spouse name: Not on file   Number of children: Not on file   Years of education: Not on file   Highest education level: Not on file  Occupational History  Not on file  Tobacco Use   Smoking status: Never   Smokeless tobacco: Never  Vaping Use   Vaping Use: Never used  Substance and Sexual Activity   Alcohol use: Never   Drug use: Never   Sexual activity: Not  Currently  Other Topics Concern   Not on file  Social History Narrative   Not on file   Social Determinants of Health   Financial Resource Strain: Not on file  Food Insecurity: Not on file  Transportation Needs: Not on file  Physical Activity: Not on file  Stress: Not on file  Social Connections: Not on file   Family History  Problem Relation Age of Onset   Gallbladder disease Mother        cholangiocarcinoma   Gastric cancer Father    Scheduled Meds:  DULoxetine  30 mg Oral Daily   dutasteride  0.5 mg Oral QPC supper   feeding supplement  237 mL Oral BID BM   gabapentin  300 mg Oral TID   melatonin  3 mg Oral QHS   mirtazapine  7.5 mg Oral QHS   Continuous Infusions:  ceFEPime (MAXIPIME) IV 2 g (02/21/22 1001)   metronidazole 500 mg (02/21/22 0923)   vancomycin 1,000 mg (02/20/22 2327)   PRN Meds:.acetaminophen **OR** acetaminophen, fentaNYL (SUBLIMAZE) injection, naLOXone (NARCAN)  injection, senna-docusate, zolpidem Medications Prior to Admission:  Prior to Admission medications   Medication Sig Start Date End Date Taking? Authorizing Provider  acetaminophen (TYLENOL) 325 MG tablet Take 2 tablets (650 mg total) by mouth 3 (three) times daily. 01/19/22  Yes Love, Ivan Anchors, PA-C  acetaminophen (TYLENOL) 500 MG tablet Take 1,000 mg by mouth 2 (two) times daily.   Yes [provider]  Ascorbic Acid (VITAMIN C PO) Take 1 capsule by mouth daily.   Yes [provider]  B Complex-C (B-COMPLEX WITH VITAMIN C) tablet Take 1 tablet by mouth daily.   Yes [provider]  bisacodyl (DULCOLAX) 10 MG suppository Place 1 suppository (10 mg total) rectally every other day. After supper 01/19/22  Yes Love, Ivan Anchors, PA-C  butalbital-acetaminophen-caffeine (FIORICET) 50-325-40 MG tablet Take 1 tablet by mouth daily as needed for headache (for headache). 01/26/22  Yes Love, Ivan Anchors, PA-C  cholecalciferol (VITAMIN D) 25 MCG tablet Take 1 tablet (1,000 Units total)  by mouth daily. 01/20/22  Yes Love, Ivan Anchors, PA-C  citalopram (CELEXA) 20 MG tablet Take 1 tablet (20 mg total) by mouth daily. 01/20/22  Yes Love, Ivan Anchors, PA-C  DULoxetine (CYMBALTA) 30 MG capsule Take 30 mg by mouth daily.   Yes [provider]  dutasteride (AVODART) 0.5 MG capsule Take 1 capsule (0.5 mg total) by mouth daily after supper. 01/26/22  Yes Love, Ivan Anchors, PA-C  enoxaparin (LOVENOX) 40 MG/0.4ML injection Inject 0.4 mLs (40 mg total) into the skin daily. 01/20/22  Yes Love, Ivan Anchors, PA-C  ferrous gluconate (FERGON) 324 MG tablet Take 324 mg by mouth daily with breakfast.   Yes [provider]  gabapentin (NEURONTIN) 100 MG capsule Take 200 mg by mouth 3 (three) times daily.   Yes [provider]  gabapentin (NEURONTIN) 300 MG capsule Take 300 mg by mouth 3 (three) times daily.   Yes [provider]  melatonin 3 MG TABS tablet Take 1 tablet (3 mg total) by mouth at bedtime. 01/19/22  Yes Love, Ivan Anchors, PA-C  mirtazapine (REMERON) 15 MG tablet Take 15 mg by mouth at bedtime.   Yes [provider]  naproxen (NAPROSYN) 250 MG tablet Take 250 mg by mouth 2 (two) times daily. 02/10/22  Yes [provider]  nystatin (MYCOSTATIN/NYSTOP) powder Apply 1 Application topically 2 (two) times daily.   Yes [provider]  Pediatric Multiple Vitamins (CHILDRENS MULTIVITAMIN) chewable tablet Chew 1 tablet by mouth 2 (two) times daily. 01/19/22  Yes Love, Ivan Anchors, PA-C  polycarbophil (FIBERCON) 625 MG tablet Take 1 tablet (625 mg total) by mouth 2 (two) times daily. 01/19/22  Yes Love, Ivan Anchors, PA-C  polyethylene glycol (MIRALAX / GLYCOLAX) 17 g packet Take 17 g by mouth daily as needed for mild constipation. 01/19/22  Yes Love, Ivan Anchors, PA-C  simvastatin (ZOCOR) 20 MG tablet Take 20 mg by mouth at bedtime. 07/18/19  Yes [provider]  topiramate (TOPAMAX) 25 MG tablet Take 1 tablet (25 mg total) by mouth at bedtime. 01/26/22  Yes  Love, Ivan Anchors, PA-C  zolpidem (AMBIEN) 5 MG tablet Take 1 tablet (5 mg total) by mouth at bedtime. 01/26/22  Yes Love, Ivan Anchors, PA-C  feeding supplement (ENSURE ENLIVE / ENSURE PLUS) LIQD Take 237 mLs by mouth with breakfast, with lunch, and with evening meal. Patient not taking: Reported on 02/20/2022 01/19/22   Love, Ivan Anchors, PA-C   No Known Allergies Review of Systems  Unable to perform ROS: Other    Physical Exam Vitals and nursing note reviewed.  Constitutional:      General: He is not in acute distress.    Appearance: He is ill-appearing.  Pulmonary:     Effort: No respiratory distress.  Skin:    General: Skin is warm and dry.  Neurological:     Mental Status: He is lethargic.     Motor: Weakness present.     Vital Signs: BP (!) 146/72 (BP Location: Left Arm)   Pulse 98   Temp 97.9 F (36.6 C) (Oral)   Resp 20   Wt (P) 47.3 kg   SpO2 98%   BMI (P) 17.90 kg/m  Pain Scale: 0-10   Pain Score: 0-No pain   SpO2: SpO2: 98 % O2 Device:SpO2: 98 % O2 Flow Rate: .   IO: Intake/output summary:  Intake/Output Summary (Last 24 hours) at 02/21/2022 1419 Last data filed at 02/21/2022 1002 Gross per 24 hour  Intake 240 ml  Output 400 ml  Net -160 ml    LBM: Last BM Date :  (unknown) Baseline Weight: Weight: 49.1 kg Most recent weight: Weight: (P) 47.3 kg     Palliative Assessment/Data: PPS 20%     Time In/Out: 1420-1450/1600-1700 Time Total: 90 minutes Greater than 50%  of this time was spent counseling and coordinating care related to the above assessment and plan.  Signed by: Lin Landsman, NP   Please contact Palliative Medicine Team phone at [phone] for questions and concerns.  For individual provider: See Amion  *Portions of this note are a verbal dictation therefore any spelling and/or grammatical errors are due to the "[employer] [employer]" system interpretation.

## 2022-02-22 DIAGNOSIS — I714 Abdominal aortic aneurysm, without rupture, unspecified: Secondary | ICD-10-CM | POA: Diagnosis not present

## 2022-02-22 DIAGNOSIS — E860 Dehydration: Secondary | ICD-10-CM

## 2022-02-22 DIAGNOSIS — R638 Other symptoms and signs concerning food and fluid intake: Secondary | ICD-10-CM

## 2022-02-22 DIAGNOSIS — D638 Anemia in other chronic diseases classified elsewhere: Secondary | ICD-10-CM | POA: Diagnosis not present

## 2022-02-22 DIAGNOSIS — T827XXA Infection and inflammatory reaction due to other cardiac and vascular devices, implants and grafts, initial encounter: Secondary | ICD-10-CM | POA: Diagnosis not present

## 2022-02-22 DIAGNOSIS — G9341 Metabolic encephalopathy: Secondary | ICD-10-CM | POA: Diagnosis not present

## 2022-02-22 LAB — COMPREHENSIVE METABOLIC PANEL
ALT: 47 U/L — ABNORMAL HIGH (ref 0–44)
AST: 48 U/L — ABNORMAL HIGH (ref 15–41)
Albumin: 2 g/dL — ABNORMAL LOW (ref 3.5–5.0)
Alkaline Phosphatase: 61 U/L (ref 38–126)
Anion gap: 8 (ref 5–15)
BUN: 19 mg/dL (ref 8–23)
CO2: 18 mmol/L — ABNORMAL LOW (ref 22–32)
Calcium: 8.1 mg/dL — ABNORMAL LOW (ref 8.9–10.3)
Chloride: 105 mmol/L (ref 98–111)
Creatinine, Ser: 0.59 mg/dL — ABNORMAL LOW (ref 0.61–1.24)
GFR, Estimated: >60 mL/min (ref >60)
Glucose, Bld: 104 mg/dL — ABNORMAL HIGH (ref 70–99)
Potassium: 4 mmol/L (ref 3.5–5.1)
Sodium: 131 mmol/L — ABNORMAL LOW (ref 135–145)
Total Bilirubin: 1.2 mg/dL (ref 0.3–1.2)
Total Protein: 5.2 g/dL — ABNORMAL LOW (ref 6.5–8.1)

## 2022-02-22 LAB — CBC
HCT: 30.9 % — ABNORMAL LOW (ref 39.0–52.0)
Hemoglobin: 10.3 g/dL — ABNORMAL LOW (ref 13.0–17.0)
MCH: 31.5 pg (ref 26.0–34.0)
MCHC: 33.3 g/dL (ref 30.0–36.0)
MCV: 94.5 fL (ref 80.0–100.0)
Platelets: 289 10*3/uL (ref 150–400)
RBC: 3.27 MIL/uL — ABNORMAL LOW (ref 4.22–5.81)
RDW: 15.9 % — ABNORMAL HIGH (ref 11.5–15.5)
WBC: 11 10*3/uL — ABNORMAL HIGH (ref 4.0–10.5)
nRBC: 0 % (ref 0.0–0.2)

## 2022-02-22 LAB — AMMONIA: Ammonia: 31 umol/L (ref 9–35)

## 2022-02-22 LAB — PHOSPHORUS: Phosphorus: 2 mg/dL — ABNORMAL LOW (ref 2.5–4.6)

## 2022-02-22 LAB — MAGNESIUM: Magnesium: 1.9 mg/dL (ref 1.7–2.4)

## 2022-02-22 MED ORDER — OXYCODONE-ACETAMINOPHEN 5-325 MG PO TABS
1.0000 | ORAL_TABLET | ORAL | Status: DC | PRN
  Administered 2022-02-22 – 2022-02-23 (×3): 1 via ORAL
  Administered 2022-02-23: 2 via ORAL
  Filled 2022-02-22 (×5): qty 1

## 2022-02-22 MED ORDER — POTASSIUM & SODIUM PHOSPHATES 280-160-250 MG PO PACK
1.0000 | PACK | Freq: Three times a day (TID) | ORAL | Status: DC
  Administered 2022-02-22 – 2022-02-23 (×4): 1 via ORAL
  Filled 2022-02-22 (×6): qty 1

## 2022-02-22 NOTE — Progress Notes (Signed)
[facility]) Hospital Liaison RN note  Received request from [facility] for hospice services at home after discharge. Chart and patient information under review by Hospice physician. Hospice eligibility pending at this time.  Spoke with [name] to initiate education related to hospice philosophy, services, and team approach to care. Patient/family verbalized understanding of information given. Per discussion, the plan is for discharge home by EMS on 6.19.23  DME needs discussed. Patient has the following equipment in the home. None Patient/family requests the following equipment for deliver: Hospital bed, transport wheelchair and overbed table. Address has been verified and is correct in the chart.  Tammi Sou is the family contact to arrange time of equipment delivery.   Please send signed and completed DNR with patient/family. Please provide symptoms at discharge as needed for ongoing symptom management.   Please call with any hospice related questions or concerns.  Thank you for the opportunity to participate in this patient's care.  Thea Gist, Charity fundraiser, BSN [employer] [phone]

## 2022-02-22 NOTE — Progress Notes (Signed)
[name]                                                                               Allen Arias, is a 81 y.o. 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  He was started Arias broad-spectrum antibiotics.  Vascular surgery consulted and did not feel he is a good candidate for surgical intervention and recommended palliative care consult.  Palliative medicine consulted as well.   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   Acute metabolic encephalopathy:  Multifactorial including infection, polypharmacy and possible dehydration and decreased oral intake.  Alert and oriented.    Hypokalemia Replaced    Elevated liver enzymes Improving CK level within normal  limits.   Neurogenic bladder Continue with home medications.    Anemia of chronic disease Hemoglobin appears to be stable    Hypertension blood pressure parameters are optimal.         Estimated body mass index is 17.79 kg/m as calculated from the following:   Height as of 02/11/22: 5\' 4"  (1.626 m).   Weight as of this encounter: 47 kg.  Code Status: full code  DVT Prophylaxis:  SCDs Start: 02/20/22 0533   Level of Care: Level of care: Med-Surg Family Communication: Updated patient's daughter at bedside.   Disposition Plan:     Remains inpatient appropriate:  family deciding about comfort measures. Waiting for other family members to come.     Consultants:   Palliative care Vascular surgery.   Antimicrobials:   Anti-infectives (From admission, onward)    Start     Dose/Rate Route Frequency Ordered Stop   02/21/22 0845  ceFEPIme (MAXIPIME) 2 g in sodium chloride 0.9 % 100 mL IVPB        2 g 200 mL/hr over 30 Minutes Intravenous Every 8 hours 02/21/22 0750     02/21/22 0830  metroNIDAZOLE (FLAGYL) IVPB 500 mg        500 mg 100 mL/hr over 60 Minutes Intravenous 2 times daily 02/21/22 0740 02/26/22  [id number]   02/20/22 2200  vancomycin (VANCOCIN) IVPB 1000 mg/200 mL premix        1,000 mg 200 mL/hr over 60 Minutes Intravenous Every 24 hours 02/20/22 0549     02/20/22 1000  piperacillin-tazobactam (ZOSYN) IVPB 3.375 g  Status:  Discontinued        3.375 g 12.5 mL/hr over 240 Minutes Intravenous Every 8 hours 02/20/22 0544 02/21/22 0740   02/20/22 0100  vancomycin (VANCOCIN) IVPB 1000 mg/200 mL premix        1,000 mg 200 mL/hr over 60 Minutes Intravenous  Once 02/20/22 0055 02/20/22 0412   02/20/22 0100  piperacillin-tazobactam (ZOSYN) IVPB 3.375 g        3.375 g 100 mL/hr over 30 Minutes Intravenous  Once 02/20/22 0055 02/20/22 0242        Medications  Scheduled Meds:  DULoxetine  30 mg Oral Daily   dutasteride  0.5 mg Oral QPC supper   feeding  supplement  237 mL Oral BID BM   gabapentin  300 mg Oral TID   melatonin  3 mg Oral QHS   mirtazapine  7.5 mg Oral QHS   Continuous Infusions:  ceFEPime (MAXIPIME) IV 2 g (02/22/22 0910)   metronidazole 500 mg (02/22/22 0908)   vancomycin 1,000 mg (02/21/22 2207)   PRN Meds:.acetaminophen **OR** acetaminophen, fentaNYL (SUBLIMAZE) injection, naLOXone (NARCAN)  injection, oxyCODONE-acetaminophen, senna-docusate, zolpidem    Subjective:   Allen Arias was seen and examined today.  Requesting for pain meds.   Objective:   Vitals:   02/21/22 2037 02/22/22 0500 02/22/22 0528 02/22/22 0908  BP: 137/75  112/80 (!) 153/69  Pulse: 90  93 86  Resp: 18  18 18   Temp: 98 F (36.7 C)  98.4 F (36.9 C) (!) 97.5 F (36.4 C)  TempSrc: Oral  Oral Oral  SpO2: 99%  97% 100%  Weight:  47 kg      Intake/Output Summary (Last 24 hours) at 02/22/2022 1200 Last data filed at 02/22/2022 0909 Gross per 24 hour  Intake 1298.65 ml  Output 600 ml  Net 698.65 ml   Filed Weights   02/21/22 0500 02/21/22 0748 02/22/22 0500  Weight: 49.1 kg (P) 47.3 kg 47 kg     Exam General exam: Cachectic looking elderly gentleman not in any kind of distress. Respiratory system: Clear to auscultation. Respiratory effort normal. Cardiovascular system: S1 & S2 heard, RRR.  Gastrointestinal system: Abdomen is soft, tender, bowel sounds wnl. Central nervous system: Alert and oriented. No focal neurological deficits. Extremities: Symmetric 5 x 5 power. Skin: No rashes, lesions or ulcers Psychiatry: anxious.    Data Reviewed:  I have personally reviewed following labs and imaging studies   CBC Lab Results  Component Value Date   WBC 11.0 (H) 02/22/2022   RBC 3.27 (L) 02/22/2022   HGB 10.3 (L) 02/22/2022   HCT 30.9 (L) 02/22/2022   MCV 94.5 02/22/2022   MCH 31.5 02/22/2022   PLT 289 02/22/2022   MCHC 33.3 02/22/2022   RDW 15.9 (H) 02/22/2022   LYMPHSABS 0.5 (L) 02/19/2022   MONOABS 0.6 02/19/2022    EOSABS 0.0 02/19/2022   BASOSABS 0.0 [id number]     Last metabolic panel Lab Results  Component Value Date   NA 131 (L) 02/22/2022   K 4.0 02/22/2022   CL 105 02/22/2022   CO2 18 (L) 02/22/2022   BUN 19 02/22/2022   CREATININE 0.59 (L) 02/22/2022   GLUCOSE 104 (H) 02/22/2022   GFRNONAA >60 02/22/2022  GFRAA >60 02/04/2020   CALCIUM 8.1 (L) 02/22/2022   PHOS 2.0 (L) 02/22/2022   PROT 5.2 (L) 02/22/2022   ALBUMIN 2.0 (L) 02/22/2022   BILITOT 1.2 02/22/2022   ALKPHOS 61 02/22/2022   AST 48 (H) 02/22/2022   ALT 47 (H) 02/22/2022   ANIONGAP 8 02/22/2022    CBG (last 3)  No results for input(s): "GLUCAP" in the last 72 hours.    Coagulation Profile: No results for input(s): "INR", "PROTIME" in the last 168 hours.   Radiology Studies: No results found.     Kathlen Mody M.D. [name] 02/22/2022, 12:00 PM  Available via Epic secure chat 7am-7pm After 7 pm, please refer to night coverage provider listed Arias amion.

## 2022-02-22 NOTE — Progress Notes (Signed)
Mobility Specialist Progress Note:   02/22/22 1500  Mobility  Activity Turned to left side;Turned to right side;Turned to back - supine  Level of Assistance Maximum assist, patient does 25-49%  Assistive Device None  Activity Response Tolerated well  $Mobility charge 1 Mobility   RN requesting assistance with turning for pericare. Pt required maxA to turn to either side. Left turned on left side with all needs met.   Anna Kincaid Acute Rehab Secure Chat or Office Phone: [phone]  

## 2022-02-22 NOTE — Progress Notes (Signed)
Daily Progress Note   Patient Name: Allen Arias       Date: 02/22/2022 DOB: 1941/06/20  Age: 81 y.o. MRN#: [id number] Attending Physician: Kathlen Mody, MD Primary Care Physician: [name], MD Admit Date: 02/19/2022  Reason for Consultation/Follow-up: Establishing goals of care  Subjective:  11:15 AM Received notification patient's daughter called with questions.   11:30 AM  Chart review performed. Returned daughter's call - she is at patient's bedside, will meet her there. Received report from primary RN - no acute concerns. RN reports patient is still with minimal PO intake - only with sips of ensure today.  12:00 PM Went to patient's bedside - daughter/[name] present. Patient was lying in bed awake. No signs or non-verbal gestures of pain or discomfort noted. No respiratory distress, increased work of breathing, or secretions noted. Daughter requests to meet at table in hallway.   Sat down with daughter. Clarified questions about continuing foley catheter at discharge, family vs hospice expectations in context of caring for patient at home, DME recommendations/needs if decision for home hospice. Also reviewed difference between residential and home hospice. At this time, daughter feels they would like to get the patient home with hospice tomorrow with goal for his transfer to [facility] after several days to 1 week. She does not want to make final decisions until her brother arrives in town from [place] this afternoon. If the goal is for discharge home with hospice, encouraged her to let me know as soon as they decide. This will allow time for DME to get ordered today and can schedule delivery for tomorrow to meet their goal of discharging patient home tomorrow. Family would not want him  discharged prior to DME arriving to the home.   All questions and concerns addressed. Encouraged to call with questions and/or concerns. PMT card previously provided.  Updated TOC, Dr. Blake Divine, [facility] liaison, primary RN of possible DME request this afternoon.   2:47 PM Received notification daughter/[name] called PMT phone.   Called [name] - she confirms goal is for patient discharge home with hospice. They are requesting DME - wheelchair and hospital bed. She asks if DME can be delivered today-  explained I would check with hospice liaison, but likely it would be delivered tomorrow. Family will be ready for patient's discharge after DME delivery. Family plan on  keeping patient at home for several days to one week and then will want him transferred to [facility].   Patient/family would like to continue current gentle medical interventions to help patient feel as best he can prior to discharge.  Family also question if patient can discharge home with foley catheter - can place orders to be initiated tomorrow before discharge.   She also asks if someone can help her navigate next steps on getting patient discharged from [facility] - will notify TOC.   Answered all questions.  Notified interdisciplinary team of patient/family wishes for home hospice as soon as DME delivered.   Length of Stay: 2  Current Medications: Scheduled Meds:   DULoxetine  30 mg Oral Daily   dutasteride  0.5 mg Oral QPC supper   feeding supplement  237 mL Oral BID BM   gabapentin  300 mg Oral TID   melatonin  3 mg Oral QHS   mirtazapine  7.5 mg Oral QHS   potassium & sodium phosphates  1 packet Oral TID WC & HS    Continuous Infusions:  ceFEPime (MAXIPIME) IV 2 g (02/22/22 0910)   metronidazole 500 mg (02/22/22 0908)   vancomycin 1,000 mg (02/21/22 2207)    PRN Meds: acetaminophen **OR** acetaminophen, fentaNYL (SUBLIMAZE) injection, naLOXone (NARCAN)  injection, oxyCODONE-acetaminophen, senna-docusate,  zolpidem  Physical Exam Vitals and nursing note reviewed.  Constitutional:      General: He is not in acute distress. Pulmonary:     Effort: No respiratory distress.  Skin:    General: Skin is warm and dry.  Neurological:     Mental Status: He is alert and oriented to person, place, and time.  Psychiatric:        Attention and Perception: Attention normal.        Behavior: Behavior is cooperative.        Cognition and Memory: Cognition and memory normal.             Vital Signs: BP (!) 153/69 (BP Location: Left Arm)   Pulse 86   Temp (!) 97.5 F (36.4 C) (Oral)   Resp 18   Wt 47 kg   SpO2 100%   BMI 17.79 kg/m  SpO2: SpO2: 100 % O2 Device: O2 Device: Room Air O2 Flow Rate:    Intake/output summary:  Intake/Output Summary (Last 24 hours) at 02/22/2022 1313 Last data filed at 02/22/2022 3299 Gross per 24 hour  Intake 1298.65 ml  Output 600 ml  Net 698.65 ml   LBM: Last BM Date :  (unknown per patient) Baseline Weight: Weight: 49.1 kg Most recent weight: Weight: 47 kg       Palliative Assessment/Data: PPS 20%      Patient Active Problem List   Diagnosis Date Noted   Protein-calorie malnutrition, severe (HCC) 02/21/2022   Goals of care, counseling/discussion 02/21/2022   Language barrier 02/21/2022   Acute metabolic encephalopathy 02/21/2022   Hypokalemia 02/21/2022   Elevated liver enzymes 02/21/2022   Anemia of chronic disease 02/21/2022   Infected aortic endograft, initial encounter (HCC) 02/20/2022   Failure to thrive in adult    Paraplegia (HCC) 02/11/2022   Frequent headaches 01/26/2022   Insomnia 01/26/2022   Neurogenic bowel 01/19/2022   Neurogenic bladder 01/19/2022   Cholelithiasis 01/19/2022   Reactive depression 01/19/2022   Decreased appetite 01/19/2022   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 male  with past medical history of hypertension, BPH, and AAA status post endovascular repair in April 2023 complicated by spinal cord infarction and bilateral lower extremity paraplegia presented to ED on 02/19/2022 [facility] with family concern for progressive somnolence and decreased appetite.  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  Patient was evaluated by vascular surgery who does not think patient will survive any attempts to treat his infection, which would likely require an open operation - hospice was recommended.  Assessment: Principal Problem:   Infected aortic endograft, initial encounter [facility]) Active Problems:   Hyponatremia   Essential hypertension   AAA (abdominal aortic aneurysm) (HCC)   Status post endovascular aneurysm repair (EVAR)   Paraplegia (HCC)   Failure to thrive in adult   Protein-calorie malnutrition, severe (HCC)   Goals of care, counseling/discussion   Language barrier   Acute metabolic encephalopathy   Hypokalemia   Elevated liver enzymes   Anemia of chronic disease   Concern about end of life  Recommendations/Plan: Continue current gentle medical interventions to help patient feel as best he can prior to discharge Goal is for discharge home with hospice once DME is delivered - they are hopeful for tomorrow 6/19 Continue full code for now - hospice can continue code status discussions once at home. Patient/family seem overwhelmed with decisions at this time Discharge with foley catheter - order placed. Do not  need to initiate until 6/19 or shortly prior to discharge Family are hopeful patient can stay home several days to one week - they are planning on his transfer to residential hospice facility/[facility] after that time [facility] consult placed for:  home hospice and request for guidance on how to discharge him from [facility]. TOC and hospice liaison notified PMT will continue to follow peripherally. If there are any imminent needs please call the service directly  Goals of Care and Additional Recommendations: Limitations on Scope of Treatment: Avoid Hospitalization, Minimize Medications, No Artificial Feeding, No Surgical Procedures, and No Tracheostomy  Code Status:    Code Status Orders  (From admission, onward)           Start     Ordered   02/20/22 0533  Full code  Continuous        02/20/22 0535           Code Status History     Date Active Date Inactive Code Status Order ID Comments User Context   12/31/2021 1648 01/26/2022 1511 Full Code [id number]  Jerene Pitch Inpatient   12/26/2021 1426 12/31/2021 1557 Full Code [id number]  Graceann Congress, PA-C Inpatient   12/26/2021 1426 12/26/2021 1426 Full Code [id number]  Dory Horn Inpatient   02/03/2020 1306 02/04/2020 2024 Full Code [id number]  DanfordEarl Lites, MD Inpatient       Prognosis:  < 2 weeks  Discharge Planning: Home with Hospice  Care plan was discussed with primary RN, Dr. Blake Divine, [facility], hospice liaison, patient's family  Thank you for allowing the Palliative Medicine Team to assist in the care of this  patient.   Total Time 70 minutes Prolonged Time Billed  yes       Greater than 50%  of this time was spent counseling and coordinating care related to the above assessment and plan.  Haskel Khan, NP  Please contact Palliative Medicine Team phone at [phone] for questions and concerns.   *Portions of this note are a verbal dictation therefore any spelling and/or grammatical errors  are due to the "[employer]" system interpretation.

## 2022-02-23 DIAGNOSIS — Z515 Encounter for palliative care: Secondary | ICD-10-CM

## 2022-02-23 DIAGNOSIS — D638 Anemia in other chronic diseases classified elsewhere: Secondary | ICD-10-CM | POA: Diagnosis not present

## 2022-02-23 DIAGNOSIS — L8990 Pressure ulcer of unspecified site, unspecified stage: Secondary | ICD-10-CM | POA: Insufficient documentation

## 2022-02-23 DIAGNOSIS — G9341 Metabolic encephalopathy: Secondary | ICD-10-CM | POA: Diagnosis not present

## 2022-02-23 DIAGNOSIS — T827XXA Infection and inflammatory reaction due to other cardiac and vascular devices, implants and grafts, initial encounter: Secondary | ICD-10-CM | POA: Diagnosis not present

## 2022-02-23 DIAGNOSIS — E860 Dehydration: Secondary | ICD-10-CM | POA: Diagnosis not present

## 2022-02-23 MED ORDER — OXYCODONE-ACETAMINOPHEN 5-325 MG PO TABS: 1.0000 | ORAL_TABLET | Freq: Three times a day (TID) | ORAL | 0 refills | Status: DC | PRN

## 2022-02-23 MED ORDER — GABAPENTIN 300 MG PO CAPS: 300.0000 mg | ORAL_CAPSULE | Freq: Three times a day (TID) | ORAL | 0 refills | Status: AC

## 2022-02-23 MED ORDER — ZOLPIDEM TARTRATE 5 MG PO TABS: 5.0000 mg | ORAL_TABLET | Freq: Every day | ORAL | 0 refills | Status: AC

## 2022-02-23 MED ORDER — OXYCODONE-ACETAMINOPHEN 5-325 MG PO TABS: 1.0000 | ORAL_TABLET | ORAL | 0 refills | Status: AC | PRN

## 2022-02-23 NOTE — Progress Notes (Addendum)
Received a phone call from patient's daughter regarding not being able to find prescriptions for pick up other than oral oxycodone. This RN called the [facility] pharmacy to confirm. This RN reached out to Dr. Blake Divine who sent over prescriptions for Ambien and Gabapentin to the [facility] at [facility] in [place]. This RN called [facility] pharmacy back, they had not received the new prescriptions yet but they said there was sometimes a delay with electronic orders. This RN connected with the hospice liaison, who connected with the hospice RN completing the home assessment. She reports she will be at the home at 5:00pm. This RN explained the situation with medications, she was understanding.   This RN explained that should additional concerns arise overnight, the hospice team is best contact.    Shelda Jakes, RN MSN  Palliative Medicine Team

## 2022-02-23 NOTE — Discharge Summary (Addendum)
Physician Discharge Summary   Patient: Allen Arias MRN: [id number] DOB: 08/23/1941  Admit date:     02/19/2022  Discharge date: 02/23/22  Discharge Physician: Kathlen Mody   PCP: [name], MD   Recommendations at discharge:  Please follow up with home hospice MD.  Please follow up with PCP as scheduled.    Discharge Diagnoses: Principal Problem:   Infected aortic endograft, initial encounter [facility]) Active Problems:   Hyponatremia   Essential hypertension   AAA (abdominal aortic aneurysm) (HCC)   Status post endovascular aneurysm repair (EVAR)   Paraplegia (HCC)   Failure to thrive in adult   Protein-calorie malnutrition, severe (HCC)   Goals of care, counseling/discussion   Language barrier   Acute metabolic encephalopathy   Hypokalemia   Elevated liver enzymes   Anemia of chronic disease   Pressure injury of skin   Hospice care patient    Hospital Course:  [name] with PMH of AAA s/p endovascular repair/stent in 12/2021 complicated by SCI and bilateral lower extremity diplegia, neurogenic bladder and HTN presenting with increased somnolence for 3 days, abdominal pain radiating down to his legs and decreased appetite for over 3 weeks, and admitted for 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  He was started on broad-spectrum antibiotics.  Vascular surgery consulted and did not feel he is a good candidate for surgical intervention and recommended palliative care consult.  Palliative medicine consulted as well. 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  Palliative care consulted and plan for discharge home with  hospice and then to [facility].        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  Encephalopathy appears to have improved. He is Alert and oriented.      Hypokalemia Replaced       Elevated liver enzymes Improving CK level within normal limits.     Neurogenic bladder Continue with home medications.       Anemia of chronic disease Hemoglobin appears to be stable       Hypertension blood pressure parameters are optimal.              Consultants: palliative care Vascular surgery.  Procedures performed: none.   Disposition: Hospice care Diet recommendation:  Discharge Diet Orders (From admission, onward)     Start     Ordered   02/23/22 0000  Diet - low sodium heart healthy        02/23/22 1209           Regular diet DISCHARGE MEDICATION: Allergies as of 02/23/2022   No Known Allergies      Medication List     STOP taking these medications    enoxaparin 40 MG/0.4ML injection Commonly known as: LOVENOX   feeding supplement Liqd   ferrous gluconate 324 MG tablet Commonly known as: FERGON   naproxen 250 MG tablet Commonly known as: NAPROSYN   nystatin powder Commonly known  as: MYCOSTATIN/NYSTOP   simvastatin 20 MG tablet Commonly known as: ZOCOR   topiramate 25 MG tablet Commonly known as: TOPAMAX       TAKE these medications    acetaminophen 325 MG tablet Commonly known as: TYLENOL Take 2 tablets (650 mg total) by mouth 3 (three) times daily. What changed: Another medication with the same name was removed. Continue taking this medication, and follow the directions you see here.   B-complex with vitamin C  tablet Take 1 tablet by mouth daily.   bisacodyl 10 MG suppository Commonly known as: DULCOLAX Place 1 suppository (10 mg total) rectally every other day. After supper   butalbital-acetaminophen-caffeine 50-325-40 MG tablet Commonly known as: FIORICET Take 1 tablet by mouth daily as needed for headache (for headache).   childrens multivitamin chewable tablet Chew 1 tablet by mouth 2 (two) times daily.   citalopram 20 MG tablet Commonly known as: CELEXA Take 1 tablet (20 mg total) by mouth daily.   DULoxetine 30 MG capsule Commonly known as: CYMBALTA Take 30 mg by mouth daily.   dutasteride 0.5 MG capsule Commonly known as: AVODART Take 1 capsule (0.5 mg total) by mouth daily after supper.   gabapentin 300 MG capsule Commonly known as: NEURONTIN Take 300 mg by mouth 3 (three) times daily. What changed: Another medication with the same name was removed. Continue taking this medication, and follow the directions you see here.   melatonin 3 MG Tabs tablet Take 1 tablet (3 mg total) by mouth at bedtime.   mirtazapine 15 MG tablet Commonly known as: REMERON Take 15 mg by mouth at bedtime.   oxyCODONE-acetaminophen 5-325 MG tablet Commonly known as: PERCOCET/ROXICET Take 1-2 tablets by mouth every 4 (four) hours as needed for up to 3 days for moderate pain or severe pain.   polycarbophil 625 MG tablet Commonly known as: FIBERCON Take 1 tablet (625 mg total) by mouth 2 (two) times daily.   polyethylene glycol 17 g packet Commonly known as: MIRALAX / GLYCOLAX Take 17 g by mouth daily as needed for mild constipation.   VITAMIN C PO Take 1 capsule by mouth daily.   Vitamin D3 25 MCG tablet Commonly known as: Vitamin D Take 1 tablet (1,000 Units total) by mouth daily.   zolpidem 5 MG tablet Commonly known as: AMBIEN Take 1 tablet (5 mg total) by mouth at bedtime.               Discharge Care Instructions  (From admission, onward)           Start      Ordered   02/23/22 0000  Discharge wound care:       Comments: Local wound care   02/23/22 1209            Follow-up Information     Tracey HarriesBouska, David, MD Follow up in 1 week(s).   Specialty: Family Medicine Contact information: [address] [place] [id number] [phone]                Discharge Exam: Ceasar MonsFiled Weights   02/21/22 0500 02/21/22 0748 02/22/22 0500  Weight: 49.1 kg (P) 47.3 kg 47 kg   General exam: cachetic looking gentleman, not in distress.  Respiratory system: Clear to auscultation. Respiratory effort normal. Cardiovascular system: S1 & S2 heard, RRR. No JVD, No pedal edema. Gastrointestinal system: Abdomen is soft, mildly tender.  Normal bowel sounds heard. Central nervous system: Alert and oriented. No focal neurological deficits. Extremities:  Symmetric 5 x 5 power. Skin: pressure injury on the back Psychiatry: mood is appropriate.    Condition at discharge: fair  The results of significant diagnostics from this hospitalization (including imaging, microbiology, ancillary and laboratory) are listed below for reference.   Imaging Studies: CT ABDOMEN PELVIS W CONTRAST  Addendum Date: 02/20/2022   ADDENDUM REPORT: 02/20/2022 01:04 ADDENDUM: Critical Value/emergent results were called by telephone at the time of interpretation on 02/20/2022 at 12:59 am to provider Dr. Adela Lank, who verbally acknowledged these results. Electronically Signed   By: Charlett Nose M.D.   On: 02/20/2022 01:04   Result Date: 02/20/2022 CLINICAL DATA:  Abdominal pain, acute, nonlocalized EXAM: CT ABDOMEN AND PELVIS WITH CONTRAST TECHNIQUE: Multidetector CT imaging of the abdomen and pelvis was performed using the standard protocol following bolus administration of intravenous contrast. RADIATION DOSE REDUCTION: This exam was performed according to the departmental dose-optimization program which includes automated exposure control, adjustment of the mA and/or kV  according to patient size and/or use of iterative reconstruction technique. CONTRAST:  76mL OMNIPAQUE IOHEXOL 300 MG/ML  SOLN COMPARISON:  11/04/2021 FINDINGS: Lower chest: Coronary artery, aortic atherosclerosis. No acute abnormality. Hepatobiliary: Small layering gallstones within the gallbladder. No biliary ductal dilatation. 5 mm cyst in the left hepatic lobe, stable. No suspicious focal hepatic abnormality. Pancreas: No focal abnormality or ductal dilatation. Spleen: No focal abnormality.  Normal size. Adrenals/Urinary Tract: Adrenal glands unremarkable. Mild left hydronephrosis. The left ureter is dilated to the the level of the left iliac vessels. No stones. Right lower pole renal cyst is unchanged. No follow-up imaging recommended. Urinary bladder unremarkable. Stomach/Bowel: No evidence of bowel obstruction. Grossly unremarkable. Vascular/Lymphatic: Patient is status post stent graft repair of abdominal aortic aneurysm. There is new gas within the aneurysm sac. Aneurysm sac appears expanded with complex material. The aneurysm sac extends into the left common iliac artery, new since prior study. There is also abnormal soft tissue and gas extending external to the aneurysm sac superiorly near the 3rd portion of the duodenum and also into the retroperitoneum/periaortic regions more inferiorly. 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 Reproductive: No visible focal abnormality. Other: No free fluid or free air. Musculoskeletal: No acute bony abnormality. IMPRESSION: Status post stent graft repair of AAA. Gas is seen throughout the aneurysm sac with expansion of the aneurysm sac. 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 Exact level/site of fistula not definitively visualized, but  possibly 3rd portion of the duodenum. Mild associated left hydronephrosis, likely related to infectious process in the retroperitoneum near the left iliac vessels. Cholelithiasis. Attempts were made to contact the ER provider who is not able to be reached. Continued attempts will be made to verbally discuss this with the physician and addendum will be made at that time. Electronically Signed: By: Charlett Nose M.D. On: 02/20/2022 00:48   CT Head Wo Contrast  Result Date: 02/19/2022 CLINICAL DATA:  Mental status change, unknown cause EXAM: CT HEAD WITHOUT CONTRAST TECHNIQUE: Contiguous axial images were obtained from the base of the skull through the vertex without intravenous contrast. RADIATION DOSE REDUCTION: This exam was performed according to the departmental dose-optimization program which includes automated exposure control, adjustment of the mA and/or kV according to patient size and/or use of iterative reconstruction technique. COMPARISON:  None Available. FINDINGS: Brain: Age related  atrophy. There is moderate to advanced periventricular and deep white matter hypodensity typical of chronic small vessel ischemia. 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 There is no evidence of acute ischemia. No hemorrhage. No subdural or extra-axial collection. No midline shift or mass effect. Vascular: Atherosclerosis of skullbase vasculature without hyperdense vessel or abnormal calcification. Skull: No fracture or focal lesion. Sinuses/Orbits: Opacification of a single ethmoid air cell. Otherwise clear. Other: None. IMPRESSION: 1. No acute intracranial abnormality. 2. 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 Electronically Signed   By: Narda Rutherford M.D.   On: 02/19/2022 23:31   DG Chest 2 View  Result Date: 02/19/2022 CLINICAL DATA:  Altered mental status. EXAM: CHEST - 2 VIEW COMPARISON:  None Available.  FINDINGS: No focal consolidation, pleural effusion, pneumothorax the cardiac silhouette is within limits. No acute osseous pathology. Osteopenia with degenerative changes of the spine. Partially visualized endovascular stent graft of the abdominal aorta. IMPRESSION: No active cardiopulmonary disease. Electronically Signed   By: Elgie Collard M.D.   On: 02/19/2022 22:28    Microbiology: Results for orders placed or performed during the hospital encounter of 02/19/22  Resp Panel by RT-PCR (Flu A&B, Covid) Anterior Nasal Swab     Status: None   Collection Time: 02/19/22  9:49 PM   Specimen: Anterior Nasal Swab  Result Value Ref Range Status   SARS Coronavirus 2 by RT PCR NEGATIVE NEGATIVE Final    Comment: (NOTE) SARS-CoV-2 target nucleic acids are NOT DETECTED.  The SARS-CoV-2 RNA is generally detectable in upper respiratory specimens during the acute phase of infection. The lowest concentration of SARS-CoV-2 viral copies this assay can detect is 138 copies/mL. A negative result does not preclude SARS-Cov-2 infection and should not be used as the sole basis for treatment or other patient management decisions. A negative result may occur with  improper specimen collection/handling, submission of specimen other than nasopharyngeal swab, presence of viral mutation(s) within the areas targeted by this assay, and inadequate number of viral copies(<138 copies/mL). A negative result must be combined with clinical observations, patient history, and epidemiological information. The expected result is Negative.  Fact Sheet for Patients:  BloggerCourse.com  Fact Sheet for Healthcare Providers:  SeriousBroker.it  This test is no t yet approved or cleared by the [place] FDA and  has been authorized for detection and/or diagnosis of SARS-CoV-2 by FDA under an Emergency Use Authorization (EUA). This EUA will remain  in effect (meaning this  test can be used) for the duration of the COVID-19 declaration under Section 564(b)(1) of the Act, 21 U.S.C.section 360bbb-3(b)(1), unless the authorization is terminated  or revoked sooner.       Influenza A by PCR NEGATIVE NEGATIVE Final   Influenza B by PCR NEGATIVE NEGATIVE Final    Comment: (NOTE) The Xpert Xpress SARS-CoV-2/FLU/RSV plus assay is intended as an aid in the diagnosis of influenza from Nasopharyngeal swab specimens and should not be used as a sole basis for treatment. Nasal washings and aspirates are unacceptable for Xpert Xpress SARS-CoV-2/FLU/RSV testing.  Fact Sheet for Patients: BloggerCourse.com  Fact Sheet for Healthcare Providers: SeriousBroker.it  This test is not yet approved or cleared by the [place] FDA and has been authorized for detection and/or diagnosis of SARS-CoV-2 by FDA under an Emergency Use Authorization (EUA). This EUA will remain in effect (meaning this test can be used) for the duration of the COVID-19 declaration under  Section 564(b)(1) of the Act, 21 U.S.C. section 360bbb-3(b)(1), unless the authorization is terminated or revoked.  Performed at [facility], [address] [place], [place] [address]   Culture, blood (Routine X 2) w Reflex to ID Panel     Status: None (Preliminary result)   Collection Time: 02/20/22  6:13 AM   Specimen: BLOOD  Result Value Ref Range Status   Specimen Description BLOOD SITE NOT SPECIFIED  Final   Special Requests   Final    BOTTLES DRAWN AEROBIC AND ANAEROBIC Blood Culture results may not be optimal due to an excessive volume of blood received in culture bottles   Culture   Final    NO GROWTH 3 DAYS Performed at [facility], [address] [place], [place] [address]    Report Status PENDING  Incomplete  Culture, blood (Routine X 2) w Reflex to ID Panel     Status: None (Preliminary result)   Collection Time: 02/20/22  6:20 AM    Specimen: BLOOD  Result Value Ref Range Status   Specimen Description BLOOD SITE NOT SPECIFIED  Final   Special Requests   Final    BOTTLES DRAWN AEROBIC AND ANAEROBIC Blood Culture adequate volume   Culture   Final    NO GROWTH 3 DAYS Performed at [facility], [address] [place], [place] [address]    Report Status PENDING  Incomplete    Labs: CBC: Recent Labs  Lab 02/19/22 2255 02/21/22 0121 02/22/22 0056  WBC 11.2* 9.9 11.0*  NEUTROABS 9.9*  --   --   HGB 9.1* 9.6* 10.3*  HCT 27.6* 29.4* 30.9*  MCV 96.8 97.0 94.5  PLT 236 248 289   Basic Metabolic Panel: Recent Labs  Lab 02/19/22 2238 02/21/22 0121 02/22/22 0056  NA 133* 133* 131*  K 3.8 3.4* 4.0  CL 101 105 105  CO2 24 20* 18*  GLUCOSE 121* 107* 104*  BUN 36* 26* 19  CREATININE 0.73 0.56* 0.59*  CALCIUM 8.7* 7.9* 8.1*  MG  --  2.0 1.9  PHOS  --   --  2.0*   Liver Function Tests: Recent Labs  Lab 02/19/22 2238 02/21/22 0121 02/22/22 0056  AST 54* 46* 48*  ALT 48* 43 47*  ALKPHOS 76 67 61  BILITOT 1.2 1.2 1.2  PROT 5.9* 5.1* 5.2*  ALBUMIN 2.4* 2.0* 2.0*   CBG: No results for input(s): "GLUCAP" in the last 168 hours.  Discharge time spent: 38 minutes.   Signed: Kathlen Mody, MD [facility] 02/23/2022

## 2022-02-23 NOTE — Progress Notes (Signed)
Patient LT:JQZE Siedschlag      DOB: 1941/04/05      SPQ:[id number]      Palliative Medicine Team    Subjective: Bedside symptom check completed. Son bedside at time of visit.    Physical exam: Patient resting in bed at time of visit. Breathing even and non-labored, no excessive secretions noted. Patient without physical or non-verbal signs of pain or discomfort at this time. Patient without any concerns or needs at this time, translation provided by son of simple questions.    Assessment and plan: Pain medication requested by son. He shares that the patient had increased pain over night but that it was better controlled since this morning. This RN looked at [employer] and ensured that the analgesic dose he has been receiving inpatient matches discharge orders. All questions answered for son at this time. This RN discussed the transfer from full code to DNR with going home on hospice. Son was in agreement and stated he had discussed with daughter also. Dr. Blake Divine to sign goldenrod form for discharge. This RN discussed with bedside RN regarding pain meds prior to discharge by ambulance. Social work communicated with this RN and hospice liaison that all DME needs are ready and patient is ready to discharge home on hospice. Will continue to follow for any changes or advances.    Thank you for allowing the Palliative Medicine Team to assist in the care of this patient.     Shelda Jakes, MSN, RN Palliative Medicine Team Team Phone: [phone]  This phone is monitored 7a-7p, please reach out to attending physician outside of these hours for urgent needs.

## 2022-02-23 NOTE — Plan of Care (Signed)
Noted plans for transition to hospice care. I think this is the patient's best option. We are always available should questions arise, please don't hesitate to call.  Rande Brunt. Lenell Antu, MD [facility] Phone Number: [phone] 02/23/2022 11:48 AM

## 2022-02-23 NOTE — Progress Notes (Signed)
[facility]   If applicable, please send signed and completed DNR with patient/family upon discharge. Please provide prescriptions at discharge as needed to ensure ongoing symptom management and a transport packet.   [facility] information and contact numbers given to family and above information shared with TOC.    Please call with any questions/concerns.    Thank you for the opportunity to participate in this patient's care   Odette Fraction, MSW [facility] Liaison  [phone]

## 2022-02-23 NOTE — Plan of Care (Signed)
  Problem: Nutrition: Goal: Adequate nutrition will be maintained Outcome: Progressing   Problem: Pain Managment: Goal: General experience of comfort will improve Outcome: Progressing   Problem: Safety: Goal: Ability to remain free from injury will improve Outcome: Progressing   

## 2022-02-25 LAB — CULTURE, BLOOD (ROUTINE X 2)
Culture: NO GROWTH
Culture: NO GROWTH
Special Requests: ADEQUATE

## 2022-02-26 ENCOUNTER — Encounter: Payer: Self-pay | Admitting: Vascular Surgery

## 2022-02-27 ENCOUNTER — Other Ambulatory Visit: Payer: Self-pay | Admitting: *Deleted

## 2022-02-27 DIAGNOSIS — I7143 Infrarenal abdominal aortic aneurysm, without rupture: Secondary | ICD-10-CM

## 2022-02-27 DIAGNOSIS — Z9889 Other specified postprocedural states: Secondary | ICD-10-CM

## 2022-03-05 ENCOUNTER — Telehealth: Payer: Self-pay | Admitting: *Deleted

## 2022-03-05 NOTE — Telephone Encounter (Signed)
Notified from church that Pt .has been suffered with complicated from procedure, passed away on 08-Mar-2022. Memorial service will be hold on 03/07/2022 at church [facility] 6PM

## 2022-03-07 DEATH — deceased

## 2022-03-12 ENCOUNTER — Other Ambulatory Visit (HOSPITAL_COMMUNITY): Payer: Medicare Other

## 2022-03-12 ENCOUNTER — Encounter (HOSPITAL_COMMUNITY): Payer: Medicare Other

## 2022-03-25 ENCOUNTER — Ambulatory Visit: Payer: Medicare Other | Admitting: Vascular Surgery

## 2022-03-25 ENCOUNTER — Encounter (HOSPITAL_COMMUNITY): Payer: Medicare Other

## 2022-03-25 ENCOUNTER — Other Ambulatory Visit (HOSPITAL_COMMUNITY): Payer: Medicare Other

## 2022-06-12 ENCOUNTER — Ambulatory Visit: Payer: Medicare Other | Admitting: Physical Medicine and Rehabilitation
